# Patient Record
Sex: Female | Born: 1935 | Race: White | Hispanic: No | Smoking: Never smoker
Health system: Southern US, Community
[De-identification: ages and names within clinical notes are randomized; demographics above are authoritative.]

## PROBLEM LIST (undated history)

## (undated) ENCOUNTER — Emergency Department (HOSPITAL_COMMUNITY): Admission: EM | Payer: Medicare Other | Source: Home / Self Care

## (undated) DIAGNOSIS — F0390 Unspecified dementia without behavioral disturbance: Secondary | ICD-10-CM

## (undated) DIAGNOSIS — IMO0002 Reserved for concepts with insufficient information to code with codable children: Secondary | ICD-10-CM

## (undated) DIAGNOSIS — C541 Malignant neoplasm of endometrium: Secondary | ICD-10-CM

## (undated) DIAGNOSIS — I10 Essential (primary) hypertension: Principal | ICD-10-CM

## (undated) DIAGNOSIS — K219 Gastro-esophageal reflux disease without esophagitis: Secondary | ICD-10-CM

## (undated) DIAGNOSIS — Z923 Personal history of irradiation: Secondary | ICD-10-CM

## (undated) DIAGNOSIS — E785 Hyperlipidemia, unspecified: Secondary | ICD-10-CM

## (undated) DIAGNOSIS — M1990 Unspecified osteoarthritis, unspecified site: Secondary | ICD-10-CM

## (undated) DIAGNOSIS — J309 Allergic rhinitis, unspecified: Secondary | ICD-10-CM

## (undated) HISTORY — PX: LITHOTRIPSY: SUR834

## (undated) HISTORY — DX: Allergic rhinitis, unspecified: J30.9

## (undated) HISTORY — DX: Hyperlipidemia, unspecified: E78.5

## (undated) HISTORY — DX: Personal history of irradiation: Z92.3

## (undated) HISTORY — PX: ABDOMINAL HYSTERECTOMY: SHX81

## (undated) HISTORY — PX: CARDIAC CATHETERIZATION: SHX172

## (undated) HISTORY — DX: Reserved for concepts with insufficient information to code with codable children: IMO0002

## (undated) HISTORY — PX: OTHER SURGICAL HISTORY: SHX169

## (undated) HISTORY — PX: TUBAL LIGATION: SHX77

## (undated) HISTORY — DX: Malignant neoplasm of endometrium: C54.1

## (undated) HISTORY — DX: Essential (primary) hypertension: I10

---

## 1966-12-13 HISTORY — PX: BACK SURGERY: SHX140

## 2000-03-25 ENCOUNTER — Emergency Department (HOSPITAL_COMMUNITY): Admission: EM | Admit: 2000-03-25 | Discharge: 2000-03-26 | Payer: Self-pay | Admitting: *Deleted

## 2000-03-26 ENCOUNTER — Encounter: Payer: Self-pay | Admitting: Emergency Medicine

## 2000-03-27 ENCOUNTER — Encounter: Payer: Self-pay | Admitting: Urology

## 2000-03-27 ENCOUNTER — Inpatient Hospital Stay (HOSPITAL_COMMUNITY): Admission: EM | Admit: 2000-03-27 | Discharge: 2000-03-30 | Payer: Self-pay | Admitting: *Deleted

## 2000-03-29 ENCOUNTER — Encounter: Payer: Self-pay | Admitting: Urology

## 2000-04-07 ENCOUNTER — Encounter: Payer: Self-pay | Admitting: Urology

## 2000-04-07 ENCOUNTER — Ambulatory Visit (HOSPITAL_COMMUNITY): Admission: RE | Admit: 2000-04-07 | Discharge: 2000-04-07 | Payer: Self-pay | Admitting: Urology

## 2000-04-13 ENCOUNTER — Encounter: Payer: Self-pay | Admitting: Urology

## 2000-04-13 ENCOUNTER — Encounter: Admission: RE | Admit: 2000-04-13 | Discharge: 2000-04-13 | Payer: Self-pay | Admitting: Urology

## 2000-05-02 ENCOUNTER — Encounter: Payer: Self-pay | Admitting: Urology

## 2000-05-02 ENCOUNTER — Encounter: Admission: RE | Admit: 2000-05-02 | Discharge: 2000-05-02 | Payer: Self-pay | Admitting: Urology

## 2004-08-25 ENCOUNTER — Other Ambulatory Visit: Admission: RE | Admit: 2004-08-25 | Discharge: 2004-08-25 | Payer: Self-pay | Admitting: Internal Medicine

## 2004-10-27 ENCOUNTER — Ambulatory Visit: Payer: Self-pay | Admitting: Internal Medicine

## 2004-12-24 ENCOUNTER — Ambulatory Visit: Payer: Self-pay | Admitting: Internal Medicine

## 2005-03-17 ENCOUNTER — Ambulatory Visit: Payer: Self-pay | Admitting: Internal Medicine

## 2005-03-24 ENCOUNTER — Ambulatory Visit: Payer: Self-pay | Admitting: Internal Medicine

## 2005-04-04 ENCOUNTER — Encounter: Admission: RE | Admit: 2005-04-04 | Discharge: 2005-04-04 | Payer: Self-pay | Admitting: Orthopedic Surgery

## 2005-06-23 ENCOUNTER — Ambulatory Visit: Payer: Self-pay | Admitting: Internal Medicine

## 2005-09-15 ENCOUNTER — Ambulatory Visit: Payer: Self-pay | Admitting: Internal Medicine

## 2005-09-23 ENCOUNTER — Ambulatory Visit: Payer: Self-pay | Admitting: Internal Medicine

## 2007-05-05 ENCOUNTER — Emergency Department (HOSPITAL_COMMUNITY): Admission: EM | Admit: 2007-05-05 | Discharge: 2007-05-05 | Payer: Self-pay | Admitting: Emergency Medicine

## 2007-10-03 DIAGNOSIS — I10 Essential (primary) hypertension: Secondary | ICD-10-CM

## 2007-10-03 DIAGNOSIS — E785 Hyperlipidemia, unspecified: Secondary | ICD-10-CM

## 2007-10-03 DIAGNOSIS — E7800 Pure hypercholesterolemia, unspecified: Secondary | ICD-10-CM

## 2007-10-03 DIAGNOSIS — J309 Allergic rhinitis, unspecified: Secondary | ICD-10-CM

## 2007-10-03 HISTORY — DX: Hyperlipidemia, unspecified: E78.5

## 2007-10-03 HISTORY — DX: Essential (primary) hypertension: I10

## 2007-10-03 HISTORY — DX: Allergic rhinitis, unspecified: J30.9

## 2007-12-18 ENCOUNTER — Ambulatory Visit: Payer: Self-pay | Admitting: Internal Medicine

## 2008-01-18 ENCOUNTER — Ambulatory Visit: Payer: Self-pay | Admitting: Internal Medicine

## 2008-04-18 ENCOUNTER — Ambulatory Visit: Payer: Self-pay | Admitting: Internal Medicine

## 2008-06-06 ENCOUNTER — Encounter: Payer: Self-pay | Admitting: Internal Medicine

## 2010-05-15 ENCOUNTER — Ambulatory Visit: Payer: Self-pay | Admitting: Internal Medicine

## 2010-05-15 LAB — CONVERTED CEMR LAB
ALT: 18 units/L (ref 0–35)
AST: 23 units/L (ref 0–37)
Albumin: 4.2 g/dL (ref 3.5–5.2)
Alkaline Phosphatase: 72 units/L (ref 39–117)
BUN: 18 mg/dL (ref 6–23)
Basophils Relative: 0.8 % (ref 0.0–3.0)
Bilirubin, Direct: 0.1 mg/dL (ref 0.0–0.3)
Calcium: 9.4 mg/dL (ref 8.4–10.5)
Cholesterol: 221 mg/dL — ABNORMAL HIGH (ref 0–200)
Creatinine, Ser: 0.7 mg/dL (ref 0.4–1.2)
Eosinophils Absolute: 0.3 10*3/uL (ref 0.0–0.7)
Eosinophils Relative: 4.9 % (ref 0.0–5.0)
GFR calc non Af Amer: 84.01 mL/min (ref >60)
Glucose, Bld: 94 mg/dL (ref 70–99)
Hemoglobin: 13.9 g/dL (ref 12.0–15.0)
Lymphocytes Relative: 23.6 % (ref 12.0–46.0)
MCHC: 35 g/dL (ref 30.0–36.0)
Monocytes Relative: 7.8 % (ref 3.0–12.0)
Neutro Abs: 4.4 10*3/uL (ref 1.4–7.7)
Neutrophils Relative %: 62.9 % (ref 43.0–77.0)
RBC: 4.39 M/uL (ref 3.87–5.11)
Total Protein: 6.7 g/dL (ref 6.0–8.3)
WBC: 7 10*3/uL (ref 4.5–10.5)

## 2011-01-14 NOTE — Assessment & Plan Note (Signed)
Summary: MED CK (REFILLS) // RS   Vital Signs:  Patient profile:   77 year old female Weight:      205 pounds Temp:     98.5 degrees F oral BP sitting:   140 / 90  (left arm) Cuff size:   regular  Vitals Entered By: Duard Brady LPN (May 15, 1190 11:12 AM) CC: medication review - needs refills Is Patient Diabetic? No   CC:  medication review - needs refills.  History of Present Illness: 77 year old patient who has not been seen in approximately 2 years.  She has treated hypertension, dyslipidemia, and allergic rhinitis.  She has done quite well.  Denies any cardiopulmonary complaints.  Medical regimen includes triple antihypertensive therapy, which she tolerates well.  She has been on Crestor in the past  Preventive Screening-Counseling & Management  Alcohol-Tobacco     Smoking Status: never  Allergies (verified): No Known Drug Allergies  Past History:  Past Surgical History: Reviewed history from 12/18/2007 and no changes required. status post thyroidectomy heart catheterization in 1996 gravida 3, para 3, abortus zero Tubal ligation  Family History: Reviewed history from 12/18/2007 and no changes required. father died age 63 CK D. mother died age 84 RA  Three brothers for lung cancer, Corgard, or disease.  Doubt  Two sisters positive lung cancer, peptic ulcer disease  Social History: Smoking Status:  never  Review of Systems  The patient denies anorexia, fever, weight loss, weight gain, vision loss, decreased hearing, hoarseness, chest pain, syncope, dyspnea on exertion, peripheral edema, prolonged cough, headaches, hemoptysis, abdominal pain, melena, hematochezia, severe indigestion/heartburn, hematuria, incontinence, genital sores, muscle weakness, suspicious skin lesions, transient blindness, difficulty walking, depression, unusual weight change, abnormal bleeding, enlarged lymph nodes, angioedema, and breast masses.    Physical Exam  General:   overweight-appearing.  130/82 Head:  Normocephalic and atraumatic without obvious abnormalities. No apparent alopecia or balding. Eyes:  No corneal or conjunctival inflammation noted. EOMI. Perrla. Funduscopic exam benign, without hemorrhages, exudates or papilledema. Vision grossly normal. Mouth:  Oral mucosa and oropharynx without lesions or exudates.  Teeth in good repair. Neck:  No deformities, masses, or tenderness noted. Lungs:  Normal respiratory effort, chest expands symmetrically. Lungs are clear to auscultation, no crackles or wheezes. Heart:  Normal rate and regular rhythm. S1 and S2 normal without gallop, murmur, click, rub or other extra sounds. Abdomen:  Bowel sounds positive,abdomen soft and non-tender without masses, organomegaly or hernias noted. Msk:  No deformity or scoliosis noted of thoracic or lumbar spine.   Pulses:  R and L carotid,radial,femoral,dorsalis pedis and posterior tibial pulses are full and equal bilaterally Extremities:  No clubbing, cyanosis, edema, or deformity noted with normal full range of motion of all joints.     Impression & Recommendations:  Problem # 1:  HYPERTENSION (ICD-401.9)  Her updated medication list for this problem includes:    Hyzaar 100-12.5 Mg Tabs (Losartan potassium-hctz) .Marland Kitchen... Take 1 tablet by mouth once a day    Amlodipine Besylate 5 Mg Tabs (Amlodipine besylate) ..... One daily  Orders: Venipuncture (47829) TLB-CBC Platelet - w/Differential (85025-CBCD) TLB-Hepatic/Liver Function Pnl (80076-HEPATIC) TLB-TSH (Thyroid Stimulating Hormone) (84443-TSH) TLB-BMP (Basic Metabolic Panel-BMET) (80048-METABOL) TLB-Cholesterol, Total (82465-CHO)  Problem # 2:  HYPERLIPIDEMIA (ICD-272.4)  The following medications were removed from the medication list:    Crestor 5 Mg Tabs (Rosuvastatin calcium) ..... One every other day  Orders: Venipuncture (56213) TLB-CBC Platelet - w/Differential (85025-CBCD) TLB-Hepatic/Liver Function Pnl  (80076-HEPATIC) TLB-TSH (Thyroid Stimulating Hormone) (84443-TSH)  TLB-BMP (Basic Metabolic Panel-BMET) (80048-METABOL) TLB-Cholesterol, Total (82465-CHO)  Problem # 3:  ALLERGIC RHINITIS (ICD-477.9)  Her updated medication list for this problem includes:    Zyrtec Allergy 10 Mg Tabs (Cetirizine hcl) ..... Once daily as needed    Nasonex 50 Mcg/act Susp (Mometasone furoate) ..... Once daily as needed  Orders: Venipuncture (54098) TLB-CBC Platelet - w/Differential (85025-CBCD) TLB-Hepatic/Liver Function Pnl (80076-HEPATIC) TLB-TSH (Thyroid Stimulating Hormone) (84443-TSH) TLB-BMP (Basic Metabolic Panel-BMET) (80048-METABOL) TLB-Cholesterol, Total (82465-CHO)  Complete Medication List: 1)  Hyzaar 100-12.5 Mg Tabs (Losartan potassium-hctz) .... Take 1 tablet by mouth once a day 2)  Aspirin 81 Mg Tbec (Aspirin) .Marland Kitchen.. 1 once daily 3)  Zyrtec Allergy 10 Mg Tabs (Cetirizine hcl) .... Once daily as needed 4)  Nasonex 50 Mcg/act Susp (Mometasone furoate) .... Once daily as needed 5)  Amlodipine Besylate 5 Mg Tabs (Amlodipine besylate) .... One daily  Patient Instructions: 1)  Limit your Sodium (Salt). 2)  It is important that you exercise regularly at least 20 minutes 5 times a week. If you develop chest pain, have severe difficulty breathing, or feel very tired , stop exercising immediately and seek medical attention. 3)  You need to lose weight. Consider a lower calorie diet and regular exercise.  4)  Check your Blood Pressure regularly. If it is above: 150/90 you should make an appointment. 5)  Please schedule a follow-up appointment in 6 months for CPX  6)  Advised not to eat any food or drink any liquids after 10 PM the night before your procedure. Prescriptions: AMLODIPINE BESYLATE 5 MG  TABS (AMLODIPINE BESYLATE) one daily  #90 Tablet x 6   Entered and Authorized by:   Gordy Savers  MD   Signed by:   Gordy Savers  MD on 05/15/2010   Method used:   Electronically to         [facility]* (retail)       [address]       [place], [place]  [address]       Ph: [phone]       Fax: [phone]   RxID:   [id number] NASONEX 50 MCG/ACT  SUSP (MOMETASONE FUROATE) once daily as needed  #90 x 6   Entered and Authorized by:   Gordy Savers  MD   Signed by:   Gordy Savers  MD on 05/15/2010   Method used:   Electronically to        [facility]* (retail)       [address]       [place], [place]  [address]       Ph: [phone]       Fax: [phone]   RxID:   [id number] ZYRTEC ALLERGY 10 MG  TABS (CETIRIZINE HCL) once daily as needed  #90 x 6   Entered and Authorized by:   Gordy Savers  MD   Signed by:   Gordy Savers  MD on 05/15/2010   Method used:   Electronically to        [facility]* (retail)       [address]       [place], [place]  [address]       Ph: [phone]       Fax: [phone]   RxID:   [id number] HYZAAR 100-12.5 MG  TABS (LOSARTAN POTASSIUM-HCTZ) Take 1 tablet by mouth once a day  #90 Tablet  x 0   Entered and Authorized by:   Gordy Savers  MD   Signed by:   Gordy Savers  MD on 05/15/2010   Method used:   Electronically to        [facility]* (retail)       [address]       [place], [place]  [address]       Ph: [phone]       Fax: [phone]   RxID:   [id number]

## 2011-04-30 NOTE — Discharge Summary (Signed)
[facility]  Patient:    Misty Alvarado, Misty Alvarado                       MRN: [id number] Adm. Date:  03/26/00 Disc. Date: 03/30/00 Attending:  Justine Null, M.D. [facility] CC:         Barron Alvine, M.D.                           Discharge Summary  REASON FOR ADMISSION:  The patient is a 77 year old woman admitted by Dr. Debby Bud on April 14 of this year with urosepsis.  Please refer to his dictated history and physical for details.  HOSPITAL COURSE:  The patient was admitted and blood cultures and urine culture were done.  She was treated with intravenous antibiotics and urology was consulted.  Dr. Isabel Caprice determined that the patient had a stone in a ureter with a high-grade obstruction.  Dr. Isabel Caprice placed a ureter stent on March 27, 2000, and the patient continued to improve.  She was successfully weaned off her dopamine that she required for her hypotension.  She was successfully transferred from the critical care unit to the floor and was successfully changed to p.o. antibiotics.  By March 30, 2000, the patient was alert, oriented, ambulatory, and eating a usual diet and afebrile on oral medication. She was thus discharged home in good condition.  DIAGNOSES AT THE TIME OF DISCHARGE: 1. Urosepsis secondary to #2, resolved. 2. Stone in a ureter. 3. Other chronic medical diagnoses noted during Dr. Debby Bud history and    physical.  MEDICATIONS: 1. Cipro 500 mg twice daily. 2. Vicodin one or two q.6h. as needed for pain.  FOLLOWUP:  Followup will be with Dr. Isabel Caprice within a week, to setup for probably ESWL of the stone.  DIET AND ACTIVITY:  As tolerated. DD:  04/27/00 TD:  05/02/00 Job: [id number]

## 2011-06-01 ENCOUNTER — Other Ambulatory Visit: Payer: Self-pay | Admitting: Internal Medicine

## 2011-07-13 ENCOUNTER — Other Ambulatory Visit: Payer: Self-pay | Admitting: Internal Medicine

## 2011-09-10 ENCOUNTER — Other Ambulatory Visit: Payer: Self-pay | Admitting: Internal Medicine

## 2011-09-28 ENCOUNTER — Ambulatory Visit (INDEPENDENT_AMBULATORY_CARE_PROVIDER_SITE_OTHER): Payer: Medicare Other | Admitting: Internal Medicine

## 2011-09-28 ENCOUNTER — Encounter: Payer: Self-pay | Admitting: Internal Medicine

## 2011-09-28 DIAGNOSIS — E785 Hyperlipidemia, unspecified: Secondary | ICD-10-CM

## 2011-09-28 DIAGNOSIS — I10 Essential (primary) hypertension: Secondary | ICD-10-CM

## 2011-09-28 DIAGNOSIS — J309 Allergic rhinitis, unspecified: Secondary | ICD-10-CM

## 2011-09-28 DIAGNOSIS — J069 Acute upper respiratory infection, unspecified: Secondary | ICD-10-CM

## 2011-09-28 MED ORDER — AMLODIPINE BESYLATE 5 MG PO TABS: 5.0000 mg | ORAL_TABLET | Freq: Every day | ORAL | Status: DC

## 2011-09-28 MED ORDER — MOMETASONE FUROATE 50 MCG/ACT NA SUSP: 2.0000 | Freq: Every day | NASAL | Status: DC

## 2011-09-28 MED ORDER — LOSARTAN POTASSIUM-HCTZ 100-12.5 MG PO TABS: 1.0000 | ORAL_TABLET | Freq: Every day | ORAL | Status: DC

## 2011-09-28 NOTE — Patient Instructions (Signed)
Get plenty of rest, Drink lots of  clear liquids, and use Tylenol or ibuprofen for fever and discomfort.    Limit your sodium (Salt) intake  Please check your blood pressure on a regular basis.  If it is consistently greater than 150/90, please make an office appointment.  Return in 6 months for follow-up

## 2011-09-28 NOTE — Progress Notes (Signed)
  Subjective:    Patient ID: Misty Alvarado, female    DOB: 1933/08/06, 77 y.o.   MRN: [id number]  HPI 77 year old patient who was stable until last month when she developed head and chest congestion she has had headache sore throat weakness and low-grade fever there has been a minimal nonproductive cough. She feels unwell. She has a history of hypertension which has been controlled on combination therapy. She needs medication refills. She has a history of allergic rhinitis.  Review of Systems  Constitutional: Positive for fever and fatigue.  HENT: Positive for congestion, sore throat and rhinorrhea. Negative for hearing loss, dental problem, sinus pressure and tinnitus.   Eyes: Negative for pain, discharge and visual disturbance.  Respiratory: Positive for cough. Negative for shortness of breath.   Cardiovascular: Negative for chest pain, palpitations and leg swelling.  Gastrointestinal: Negative for nausea, vomiting, abdominal pain, diarrhea, constipation, blood in stool and abdominal distention.  Genitourinary: Negative for dysuria, urgency, frequency, hematuria, flank pain, vaginal bleeding, vaginal discharge, difficulty urinating, vaginal pain and pelvic pain.  Musculoskeletal: Negative for joint swelling, arthralgias and gait problem.  Skin: Negative for rash.  Neurological: Positive for weakness. Negative for dizziness, syncope, speech difficulty, numbness and headaches.  Hematological: Negative for adenopathy.  Psychiatric/Behavioral: Negative for behavioral problems, dysphoric mood and agitation. The patient is not nervous/anxious.        Objective:   Physical Exam  Constitutional: She is oriented to person, place, and time. She appears well-developed and well-nourished.       Appears unwell but in no acute distress. Blood pressure 130/82 on repeat  HENT:  Head: Normocephalic.  Right Ear: External ear normal.  Left Ear: External ear normal.       Mild erythema  Eyes:  Conjunctivae and EOM are normal. Pupils are equal, round, and reactive to light.  Neck: Normal range of motion. Neck supple. No thyromegaly present.  Cardiovascular: Normal rate, regular rhythm, normal heart sounds and intact distal pulses.   Pulmonary/Chest: Effort normal and breath sounds normal. No respiratory distress. She has no wheezes.  Abdominal: Soft. Bowel sounds are normal. She exhibits no mass. There is no tenderness.  Musculoskeletal: Normal range of motion.  Lymphadenopathy:    She has no cervical adenopathy.  Neurological: She is alert and oriented to person, place, and time.  Skin: Skin is warm and dry. No rash noted.  Psychiatric: She has a normal mood and affect. Her behavior is normal.          Assessment & Plan:   Viral URI. Will treat symptomatically Hypertension well controlled medications refilled Allergic rhinitis stable medications refilled  Recheck in 6 months or when necessary

## 2012-09-04 ENCOUNTER — Encounter: Payer: Self-pay | Admitting: Internal Medicine

## 2012-09-04 ENCOUNTER — Ambulatory Visit (INDEPENDENT_AMBULATORY_CARE_PROVIDER_SITE_OTHER): Payer: Medicare Other | Admitting: Internal Medicine

## 2012-09-04 VITALS — BP 120/80 | Temp 97.9°F | Wt 210.0 lb

## 2012-09-04 DIAGNOSIS — I10 Essential (primary) hypertension: Secondary | ICD-10-CM

## 2012-09-04 DIAGNOSIS — J309 Allergic rhinitis, unspecified: Secondary | ICD-10-CM

## 2012-09-04 DIAGNOSIS — J069 Acute upper respiratory infection, unspecified: Secondary | ICD-10-CM

## 2012-09-04 DIAGNOSIS — E785 Hyperlipidemia, unspecified: Secondary | ICD-10-CM

## 2012-09-04 DIAGNOSIS — Z23 Encounter for immunization: Secondary | ICD-10-CM

## 2012-09-04 MED ORDER — HYDROCODONE-HOMATROPINE 5-1.5 MG/5ML PO SYRP: 5.0000 mL | ORAL_SOLUTION | Freq: Four times a day (QID) | ORAL | Status: AC | PRN

## 2012-09-04 MED ORDER — MOMETASONE FUROATE 50 MCG/ACT NA SUSP: 2.0000 | Freq: Every day | NASAL | Status: DC

## 2012-09-04 MED ORDER — LOSARTAN POTASSIUM-HCTZ 100-12.5 MG PO TABS: 1.0000 | ORAL_TABLET | Freq: Every day | ORAL | Status: DC

## 2012-09-04 MED ORDER — AMLODIPINE BESYLATE 5 MG PO TABS: 5.0000 mg | ORAL_TABLET | Freq: Every day | ORAL | Status: DC

## 2012-09-04 NOTE — Patient Instructions (Signed)
Take over-the-counter expectorants and cough medications such as  Mucinex DM.  Call if there is no improvement in 5 to 7 days or if he developed worsening cough, fever, or new symptoms, such as shortness of breath or chest pain. Limit your sodium (Salt) intake  Return in 6 months for follow-up

## 2012-09-04 NOTE — Progress Notes (Signed)
Subjective:    Patient ID: Misty Alvarado, female    DOB: [birth year], 78 y.o.   MRN: [id number]  HPI 78 year old patient who has treated hypertension and a history of dyslipidemia allergic rhinitis. She is seen today with a 3 to four-day history of chest congestion and mildly productive cough. Cough has been productive of yellow-tan sputum. No fever chills shortness of breath or wheezing. She was seen here approximately 11  months ago for a similar illness  Past Medical History  Diagnosis Date  . ALLERGIC RHINITIS 10/03/2007  . HYPERLIPIDEMIA 10/03/2007  . HYPERTENSION 10/03/2007  . Ulcer     gastric    History   Social History  . Marital Status: Widowed    Spouse Name: N/A    Number of Children: N/A  . Years of Education: N/A   Occupational History  . Not on file.   Social History Main Topics  . Smoking status: Never Smoker   . Smokeless tobacco: Never Used  . Alcohol Use: No  . Drug Use: No  . Sexually Active: Not on file   Other Topics Concern  . Not on file   Social History Narrative  . No narrative on file    Past Surgical History  Procedure Date  . Cardiac catheterization   . Tubal ligation   . Thyroidectomy     Family History  Problem Relation Age of Onset  . Arthritis Mother     No Known Allergies  Current Outpatient Prescriptions on File Prior to Visit  Medication Sig Dispense Refill  . amLODipine (NORVASC) 5 MG tablet Take 1 tablet (5 mg total) by mouth daily.  90 tablet  4  . aspirin 81 MG tablet Take 81 mg by mouth daily.        Marland Kitchen losartan-hydrochlorothiazide (HYZAAR) 100-12.5 MG per tablet Take 1 tablet by mouth daily.  90 tablet  4  . mometasone (NASONEX) 50 MCG/ACT nasal spray Place 2 sprays into the nose daily.  17 g  4    BP 120/80  Temp 97.9 F (36.6 C) (Oral)  Wt 210 lb (95.255 kg)     Review of Systems  Constitutional: Positive for fatigue.  HENT: Positive for congestion. Negative for hearing loss, sore throat, rhinorrhea,  dental problem, sinus pressure and tinnitus.   Eyes: Negative for pain, discharge and visual disturbance.  Respiratory: Positive for cough. Negative for shortness of breath.   Cardiovascular: Negative for chest pain, palpitations and leg swelling.  Gastrointestinal: Negative for nausea, vomiting, abdominal pain, diarrhea, constipation, blood in stool and abdominal distention.  Genitourinary: Negative for dysuria, urgency, frequency, hematuria, flank pain, vaginal bleeding, vaginal discharge, difficulty urinating, vaginal pain and pelvic pain.  Musculoskeletal: Negative for joint swelling, arthralgias and gait problem.  Skin: Negative for rash.  Neurological: Negative for dizziness, syncope, speech difficulty, weakness, numbness and headaches.  Hematological: Negative for adenopathy.  Psychiatric/Behavioral: Negative for behavioral problems, dysphoric mood and agitation. The patient is not nervous/anxious.        Objective:   Physical Exam  Constitutional: She is oriented to person, place, and time. She appears well-developed and well-nourished.  HENT:  Head: Normocephalic.  Right Ear: External ear normal.  Left Ear: External ear normal.  Mouth/Throat: Oropharynx is clear and moist.  Eyes: Conjunctivae normal and EOM are normal. Pupils are equal, round, and reactive to light.  Neck: Normal range of motion. Neck supple. No thyromegaly present.  Cardiovascular: Normal rate, regular rhythm, normal heart sounds and intact distal pulses.  Pulmonary/Chest: Effort normal and breath sounds normal.  Abdominal: Soft. Bowel sounds are normal. She exhibits no mass. There is no tenderness.  Musculoskeletal: Normal range of motion.  Lymphadenopathy:    She has no cervical adenopathy.  Neurological: She is alert and oriented to person, place, and time.  Skin: Skin is warm and dry. No rash noted.  Psychiatric: She has a normal mood and affect. Her behavior is normal.          Assessment &  Plan:   Viral URI with cough. We'll treat symptomatically Hypertension well controlled  Schedule CPX all medications refilled

## 2013-03-02 ENCOUNTER — Other Ambulatory Visit: Payer: Self-pay | Admitting: Obstetrics & Gynecology

## 2013-04-02 ENCOUNTER — Encounter: Payer: Self-pay | Admitting: Gynecology

## 2013-04-02 ENCOUNTER — Ambulatory Visit: Payer: Medicare Other | Attending: Gynecology | Admitting: Gynecology

## 2013-04-02 VITALS — BP 160/80 | HR 100 | Temp 98.1°F | Resp 20 | Ht 66.25 in | Wt 215.6 lb

## 2013-04-02 DIAGNOSIS — C549 Malignant neoplasm of corpus uteri, unspecified: Secondary | ICD-10-CM | POA: Insufficient documentation

## 2013-04-02 DIAGNOSIS — C541 Malignant neoplasm of endometrium: Secondary | ICD-10-CM | POA: Insufficient documentation

## 2013-04-02 NOTE — Progress Notes (Signed)
Consult Note: Gyn-Onc   Misty Alvarado 79 y.o. female  No chief complaint on file.   Assessment : Grade 1 endometrial adenocarcinoma  Plan: I recommend the patient undergo a robotic assisted hysterectomy, bilateral salpingo-oophorectomy, and possible pelvic lymphadenectomy. We'll arrange this to be performed by Dr. Cleda Mccreedy.  The risks of surgery including hemorrhage, infection, injury to adjacent viscera and thrombolic complications were all outlined. All the patient's questions are answered.      HPI: The patient is seen in consultation at the request of Dr. Langston Masker regarding management of a newly diagnosed endometrial cancer. The patient initially presented with approximately 6 weeks for postmenopausal bleeding. An endometrial biopsy is obtained. The patient denies any constitutional symptoms. She specifically denies any pelvic pain pressure or cramping and has had no weight loss. Her functional status is stable.  She has no past gynecologic history. Obstetrical history: gravida 3 she comes accompanied by her oldest daughter today.  Review of Systems:10 point review of systems is negative except as noted in interval history.   Vitals: Blood pressure 160/80, pulse 100, temperature 98.1 F (36.7 C), temperature source Oral, resp. rate 20, height 5' 6.25" (1.683 m), weight 215 lb 9.6 oz (97.796 kg).  Physical Exam: General : The patient is a healthy woman in no acute distress.  HEENT: normocephalic, extraoccular movements normal; neck is supple without thyromegally  Lynphnodes: Supraclavicular and inguinal nodes not enlarged  Abdomen: Soft, non-tender, no ascites, no organomegally, no masses, no hernias  Pelvic:  EGBUS: Normal female  Vagina: Normal, no lesions  Urethra and Bladder: Normal, non-tender  Cervix: Atrophic Uterus: Difficult to outline due to the patient's habitus. I do not think. that it is enlarged. Bi-manual examination: Non-tender; no adenxal masses or  nodularity  Rectal: normal sphincter tone, no masses, no blood  Lower extremities: No edema or varicosities. Normal range of motion      No Known Allergies  Past Medical History  Diagnosis Date  . ALLERGIC RHINITIS 10/03/2007  . HYPERLIPIDEMIA 10/03/2007  . HYPERTENSION 10/03/2007  . Ulcer     gastric    Past Surgical History  Procedure Laterality Date  . Cardiac catheterization    . Tubal ligation    . Thyroidectomy      Current Outpatient Prescriptions  Medication Sig Dispense Refill  . amLODipine (NORVASC) 5 MG tablet Take 1 tablet (5 mg total) by mouth daily.  90 tablet  4  . aspirin 81 MG tablet Take 81 mg by mouth daily.        Marland Kitchen losartan-hydrochlorothiazide (HYZAAR) 100-12.5 MG per tablet Take 1 tablet by mouth daily.  90 tablet  4  . mometasone (NASONEX) 50 MCG/ACT nasal spray Place 2 sprays into the nose daily.  17 g  4   No current facility-administered medications for this visit.    History   Social History  . Marital Status: Widowed    Spouse Name: N/A    Number of Children: N/A  . Years of Education: N/A   Occupational History  . Not on file.   Social History Main Topics  . Smoking status: Never Smoker   . Smokeless tobacco: Never Used  . Alcohol Use: No  . Drug Use: No  . Sexually Active: Not on file   Other Topics Concern  . Not on file   Social History Narrative  . No narrative on file    Family History  Problem Relation Age of Onset  . Arthritis Mother  Jeannette Corpus, MD 04/02/2013, 12:31 PM

## 2013-04-02 NOTE — Patient Instructions (Signed)
We will contact you for preoperative evaluation.

## 2013-04-03 ENCOUNTER — Telehealth: Payer: Self-pay | Admitting: Internal Medicine

## 2013-04-03 ENCOUNTER — Other Ambulatory Visit: Payer: Self-pay | Admitting: Internal Medicine

## 2013-04-03 DIAGNOSIS — Z1211 Encounter for screening for malignant neoplasm of colon: Secondary | ICD-10-CM

## 2013-04-03 DIAGNOSIS — Z780 Asymptomatic menopausal state: Secondary | ICD-10-CM

## 2013-04-03 DIAGNOSIS — Z1239 Encounter for other screening for malignant neoplasm of breast: Secondary | ICD-10-CM

## 2013-04-03 NOTE — Telephone Encounter (Signed)
Please schedule DEXA scan mammogram and colonoscopy

## 2013-04-03 NOTE — Telephone Encounter (Signed)
[facility] Triage Call Report Triage Record Num: [id number] Operator: Tomasita Crumble Patient Name: Misty Alvarado Call Date & Time: 04/02/2013 5:24:57PM Patient Phone: [phone] PCP: Patient Gender: Female PCP Fax : Patient DOB: [birth year] [facility] Name: [facility] Reason for Call: Caller: [name]/Other; PCP: Other; CB#: [phone]; Call regarding Request; states has seen Dr. Langston Masker and she has endometrial cancer. [name] has not had colonoscopy, she does not recall the last mammogram or dexa scan. It was recommended by Dr. Loree Fee to get these done before her hysterectomy 04/24/13. Advised to follow up with office during regular hours per PCP Calls, No Triage protocol. Caller states she will speak with a friend regarding which provider is preferred to do the colonoscopy. OFFICE, PLEASE NOTE CALLER WAS ADVISED TO CALL OFFICE ABOUT THIS; SHE WANTS MAMMOGRAM AND DEXA SCAN TO BE ORDERED ASAP. Protocol(s) Used: PCP Calls, No Triage (Adult) Recommended Outcome per Protocol: Call Provider within 24 Hours Reason for Outcome: [1] Follow-up call from parent regarding patient's clinical status AND [2] information not urgent Care Advice: ~ 04/

## 2013-04-03 NOTE — Telephone Encounter (Signed)
Orders done and sent to [facility].

## 2013-04-05 ENCOUNTER — Telehealth: Payer: Self-pay | Admitting: Gastroenterology

## 2013-04-05 NOTE — Telephone Encounter (Signed)
There are pre-visit available 5/6 and 5/7 then she can have the next available procedure

## 2013-04-05 NOTE — Telephone Encounter (Signed)
We have an Urgent referral for this pt and they want her seen before 04-24-13, but we don't have any pre-visit appts. Please advise.         Pt has endometrial cancerand has not had a colonoscopy It was recommended by Dr. Loree Fee to get these done before her hysterectomy 04/24/13.   Sent referral to [facility]queue. They will call pt and set up appt directly.

## 2013-04-06 ENCOUNTER — Encounter: Payer: Self-pay | Admitting: Internal Medicine

## 2013-04-09 ENCOUNTER — Ambulatory Visit
Admission: RE | Admit: 2013-04-09 | Discharge: 2013-04-09 | Disposition: A | Discharge status: Home / Self Care | Payer: Medicare Other | Source: Ambulatory Visit | Attending: Internal Medicine | Admitting: Internal Medicine

## 2013-04-09 DIAGNOSIS — Z780 Asymptomatic menopausal state: Secondary | ICD-10-CM

## 2013-04-11 ENCOUNTER — Ambulatory Visit (AMBULATORY_SURGERY_CENTER): Payer: Medicare Other | Admitting: *Deleted

## 2013-04-11 ENCOUNTER — Telehealth: Payer: Self-pay | Admitting: *Deleted

## 2013-04-11 VITALS — Ht 66.0 in | Wt 212.8 lb

## 2013-04-11 DIAGNOSIS — Z1211 Encounter for screening for malignant neoplasm of colon: Secondary | ICD-10-CM

## 2013-04-11 MED ORDER — NA SULFATE-K SULFATE-MG SULF 17.5-3.13-1.6 GM/177ML PO SOLN: ORAL | Status: DC

## 2013-04-11 NOTE — Telephone Encounter (Signed)
It is pretty common to have a colonoscopy prior to a surgery like that - if there were a major colon lesion then she would only need one surgery (two surgeries worse than two preps)      Would not change anything         ----- Message -----   From: Maple Hudson, RN   Sent: 04/11/2013 9:39 AM   To: Iva Boop, MD   Subject: need for OV or postpone colonoscopy?       Dr. Leone Payor: Pt has new diagnosis of endometrial cancer with robot assisted hysterectomy scheduled for 5/13. Pt has never had screening colonoscopy and is 79 years old. Daughter says that Dr. De Blanch recommended that she have colonoscopy before hysterectomy. I did not see anything about that in his office note from 4/21. She is scheduled for colonoscopy 5/6. If I am correct; she will have to do bowel prep before hysterectomy; that will be 2 preps in a 7 week span. Should she wait for colonoscopy until recovered from surgery or okay to proceed with colonoscopy as scheduled? Does she need OV to discuss before scheduling colonoscopy? Thanks, Olegario Messier

## 2013-04-11 NOTE — Telephone Encounter (Signed)
Daughter notified that pt should proceed with colonoscopy as planned.

## 2013-04-12 ENCOUNTER — Encounter: Payer: Self-pay | Admitting: Internal Medicine

## 2013-04-12 ENCOUNTER — Encounter (HOSPITAL_COMMUNITY): Payer: Self-pay | Admitting: Pharmacy Technician

## 2013-04-17 ENCOUNTER — Encounter: Payer: Self-pay | Admitting: Internal Medicine

## 2013-04-17 ENCOUNTER — Ambulatory Visit (AMBULATORY_SURGERY_CENTER): Payer: Medicare Other | Admitting: Internal Medicine

## 2013-04-17 VITALS — BP 136/75 | HR 74 | Temp 99.2°F | Resp 23 | Ht 66.0 in | Wt 212.0 lb

## 2013-04-17 DIAGNOSIS — Z1211 Encounter for screening for malignant neoplasm of colon: Secondary | ICD-10-CM

## 2013-04-17 DIAGNOSIS — D126 Benign neoplasm of colon, unspecified: Secondary | ICD-10-CM

## 2013-04-17 NOTE — Progress Notes (Signed)
Patient did not experience any of the following events: a burn prior to discharge; a fall within the facility; wrong site/side/patient/procedure/implant event; or a hospital transfer or hospital admission upon discharge from the facility. (G8907) Patient did not have preoperative order for IV antibiotic SSI prophylaxis. (G8918)  

## 2013-04-17 NOTE — Patient Instructions (Addendum)
Two tiny polyps were removed - they look benign.I will let you know. Otherwise normal exam with excellent prep.  Good luck with your upcoming surgery.   Iva Boop, MD, FACG  YOU HAD AN ENDOSCOPIC PROCEDURE TODAY AT THE [facility]: Refer to the procedure report that was given to you for any specific questions about what was found during the examination.  If the procedure report does not answer your questions, please call your gastroenterologist to clarify.  If you requested that your care partner not be given the details of your procedure findings, then the procedure report has been included in a sealed envelope for you to review at your convenience later.  YOU SHOULD EXPECT: Some feelings of bloating in the abdomen. Passage of more gas than usual.  Walking can help get rid of the air that was put into your GI tract during the procedure and reduce the bloating. If you had a lower endoscopy (such as a colonoscopy or flexible sigmoidoscopy) you may notice spotting of blood in your stool or on the toilet paper. If you underwent a bowel prep for your procedure, then you may not have a normal bowel movement for a few days.  DIET: Your first meal following the procedure should be a light meal and then it is ok to progress to your normal diet.  A half-sandwich or bowl of soup is an example of a good first meal.  Heavy or fried foods are harder to digest and may make you feel nauseous or bloated.  Likewise meals heavy in dairy and vegetables can cause extra gas to form and this can also increase the bloating.  Drink plenty of fluids but you should avoid alcoholic beverages for 24 hours.  ACTIVITY: Your care partner should take you home directly after the procedure.  You should plan to take it easy, moving slowly for the rest of the day.  You can resume normal activity the day after the procedure however you should NOT DRIVE or use heavy machinery for 24 hours (because of the sedation  medicines used during the test).    SYMPTOMS TO REPORT IMMEDIATELY: A gastroenterologist can be reached at any hour.  During normal business hours, 8:30 AM to 5:00 PM Monday through Friday, call [phone].  After hours and on weekends, please call the GI answering service at [phone] who will take a message and have the physician on call contact you.   Following lower endoscopy (colonoscopy or flexible sigmoidoscopy):  Excessive amounts of blood in the stool  Significant tenderness or worsening of abdominal pains  Swelling of the abdomen that is new, acute  Fever of 100F or higher  FOLLOW UP: If any biopsies were taken you will be contacted by phone or by letter within the next 1-3 weeks.  Call your gastroenterologist if you have not heard about the biopsies in 3 weeks.  Our staff will call the home number listed on your records the next business day following your procedure to check on you and address any questions or concerns that you may have at that time regarding the information given to you following your procedure. This is a courtesy call and so if there is no answer at the home number and we have not heard from you through the emergency physician on call, we will assume that you have returned to your regular daily activities without incident.  SIGNATURES/CONFIDENTIALITY: You and/or your care partner have signed paperwork which will be entered into your  electronic medical record.  These signatures attest to the fact that that the information above on your After Visit Summary has been reviewed and is understood.  Full responsibility of the confidentiality of this discharge information lies with you and/or your care-partner.

## 2013-04-17 NOTE — Op Note (Signed)
[facility] [address] [place], [address]   COLONOSCOPY PROCEDURE REPORT  PATIENT: Misty Alvarado, Misty Alvarado  MR#: [id number] BIRTHDATE: [birth year] , 79  yrs. old GENDER: Female ENDOSCOPIST: Iva Boop, MD, [facility] REFERRED [id number], Daniel PROCEDURE DATE:  04/17/2013 PROCEDURE:   Colonoscopy with snare polypectomy ASA CLASS:   Class II INDICATIONS:average risk screening.   first colonoscopy MEDICATIONS: propofol (Diprivan) 200mg  IV  DESCRIPTION OF PROCEDURE:   After the risks benefits and alternatives of the procedure were thoroughly explained, informed consent was obtained.  A digital rectal exam revealed no rectal mass and A digital rectal exam revealed external hemorrhoids/tags. The LB [id number]-[id number]  endoscope was introduced through the anus and advanced to the cecum, which was identified by both the appendix and ileocecal valve. No adverse events experienced.   The quality of the prep was excellent using Suprep  The instrument was then slowly withdrawn as the colon was fully examined.      COLON FINDINGS: Two diminutive polypoid shaped sessile polyps were found in the distal sigmoid colon and rectum.  A polypectomy was performed with a cold snare.  The resection was complete and the polyp tissue was completely retrieved.   The colon mucosa was otherwise normal.  Retroflexed views revealed no abnormalities. The time to cecum=4 minutes 10 seconds.  Withdrawal time=11 minutes 18 seconds.  The scope was withdrawn and the procedure completed. COMPLICATIONS: There were no complications.  ENDOSCOPIC IMPRESSION: 1.   Two diminutive sessile polyps were found in the distal sigmoid colon and rectum; polypectomy was performed with a cold snare 2.   The colon mucosa was otherwise normal - excellent prep  RECOMMENDATIONS: will notify pathology results, probably will not need a routine repeat colonoscopy with these findings.   eSigned:  Iva Boop, MD, [facility] 04/17/2013 12:21 PM   cc: The Patient and De Blanch MD

## 2013-04-17 NOTE — Progress Notes (Signed)
Lidocaine-40mg IV prior to Propofol InductionPropofol given over incremental dosages 

## 2013-04-18 ENCOUNTER — Telehealth: Payer: Self-pay

## 2013-04-18 NOTE — Telephone Encounter (Signed)
Left message on answering machine. 

## 2013-04-20 ENCOUNTER — Encounter (HOSPITAL_COMMUNITY)
Admission: RE | Admit: 2013-04-20 | Discharge: 2013-04-20 | Disposition: A | Discharge status: Home / Self Care | Payer: Medicare Other | Source: Ambulatory Visit | Attending: Gynecologic Oncology | Admitting: Gynecologic Oncology

## 2013-04-20 ENCOUNTER — Encounter: Payer: Medicare Other | Admitting: Internal Medicine

## 2013-04-20 ENCOUNTER — Ambulatory Visit (HOSPITAL_COMMUNITY)
Admission: RE | Admit: 2013-04-20 | Discharge: 2013-04-20 | Disposition: A | Discharge status: Home / Self Care | Payer: Medicare Other | Source: Ambulatory Visit | Attending: Gynecologic Oncology | Admitting: Gynecologic Oncology

## 2013-04-20 ENCOUNTER — Encounter (HOSPITAL_COMMUNITY): Payer: Self-pay

## 2013-04-20 DIAGNOSIS — Z01810 Encounter for preprocedural cardiovascular examination: Secondary | ICD-10-CM | POA: Insufficient documentation

## 2013-04-20 DIAGNOSIS — J8410 Pulmonary fibrosis, unspecified: Secondary | ICD-10-CM | POA: Insufficient documentation

## 2013-04-20 DIAGNOSIS — Z01812 Encounter for preprocedural laboratory examination: Secondary | ICD-10-CM | POA: Insufficient documentation

## 2013-04-20 DIAGNOSIS — C549 Malignant neoplasm of corpus uteri, unspecified: Secondary | ICD-10-CM | POA: Insufficient documentation

## 2013-04-20 DIAGNOSIS — Z01818 Encounter for other preprocedural examination: Secondary | ICD-10-CM | POA: Insufficient documentation

## 2013-04-20 DIAGNOSIS — Z0183 Encounter for blood typing: Secondary | ICD-10-CM | POA: Insufficient documentation

## 2013-04-20 HISTORY — DX: Unspecified osteoarthritis, unspecified site: M19.90

## 2013-04-20 HISTORY — DX: Gastro-esophageal reflux disease without esophagitis: K21.9

## 2013-04-20 LAB — COMPREHENSIVE METABOLIC PANEL
ALT: 16 U/L (ref 0–35)
AST: 19 U/L (ref 0–37)
Albumin: 3.8 g/dL (ref 3.5–5.2)
Calcium: 9.4 mg/dL (ref 8.4–10.5)
Creatinine, Ser: 0.81 mg/dL (ref 0.50–1.10)
Sodium: 139 mEq/L (ref 135–145)
Total Protein: 7.2 g/dL (ref 6.0–8.3)

## 2013-04-20 LAB — CBC WITH DIFFERENTIAL/PLATELET
Basophils Absolute: 0.1 10*3/uL (ref 0.0–0.1)
Basophils Relative: 1 % (ref 0–1)
Eosinophils Absolute: 0.2 10*3/uL (ref 0.0–0.7)
Eosinophils Relative: 4 % (ref 0–5)
Lymphocytes Relative: 35 % (ref 12–46)
MCHC: 34.1 g/dL (ref 30.0–36.0)
MCV: 86.5 fL (ref 78.0–100.0)
Monocytes Absolute: 0.4 10*3/uL (ref 0.1–1.0)
Platelets: 290 10*3/uL (ref 150–400)
RDW: 12.1 % (ref 11.5–15.5)
WBC: 5.5 10*3/uL (ref 4.0–10.5)

## 2013-04-20 LAB — ABO/RH: ABO/RH(D): A POS

## 2013-04-20 NOTE — Pre-Procedure Instructions (Signed)
EKG AND CXR WERE DONE TODAY - PREOP AT [facility]. 

## 2013-04-20 NOTE — Patient Instructions (Addendum)
YOUR SURGERY IS SCHEDULED AT [facility]  ON:  Tuesday  513  REPORT TO [facility] AT:  9:45 AM      PHONE # FOR SHORT STAY IS [phone]          REMEMBER  -- CLEAR LIQUID DIET ONLY - ON  Monday 5/12.  DO NOT EAT OR DRINK ANYTHING AFTER MIDNIGHT THE NIGHT BEFORE YOUR SURGERY.  YOU MAY BRUSH YOUR TEETH, RINSE OUT YOUR MOUTH--BUT NO WATER, NO FOOD, NO CHEWING GUM, NO MINTS, NO CANDIES, NO CHEWING TOBACCO.  PLEASE TAKE THE FOLLOWING MEDICATIONS THE AM OF YOUR SURGERY WITH A FEW SIPS OF WATER:  NO MEDICINES TO TAKE THE DAY OF SURGERY.     DO NOT BRING VALUABLES, MONEY, CREDIT CARDS.  DO NOT WEAR JEWELRY, MAKE-UP, NAIL POLISH AND NO METAL PINS OR CLIPS IN YOUR HAIR. CONTACT LENS, DENTURES / PARTIALS, GLASSES SHOULD NOT BE WORN TO SURGERY AND IN MOST CASES-HEARING AIDS WILL NEED TO BE REMOVED.  BRING YOUR GLASSES CASE, ANY EQUIPMENT NEEDED FOR YOUR CONTACT LENS. FOR PATIENTS ADMITTED TO THE HOSPITAL--CHECK OUT TIME THE DAY OF DISCHARGE IS 11:00 AM.  ALL INPATIENT ROOMS ARE PRIVATE - WITH BATHROOM, TELEPHONE, TELEVISION AND WIFI INTERNET.  TURNING, COUGHING, DEEP BREATHING, LEG EXERCISES EVERY 2 HOURS AFTER SURGERY WILL HELP TO PREVENT BREATHING AND CIRCULATION PROBLEMS.                              PLEASE READ OVER ANY  FACT SHEETS THAT YOU WERE GIVEN: MRSA INFORMATION, BLOOD TRANSFUSION INFORMATION, INCENTIVE SPIROMETER INFORMATION. FAILURE TO FOLLOW THESE INSTRUCTIONS MAY RESULT IN THE CANCELLATION OF YOUR SURGERY.   PATIENT SIGNATURE_________________________________

## 2013-04-23 ENCOUNTER — Ambulatory Visit
Admission: RE | Admit: 2013-04-23 | Discharge: 2013-04-23 | Disposition: A | Discharge status: Home / Self Care | Payer: Medicare Other | Source: Ambulatory Visit | Attending: Internal Medicine | Admitting: Internal Medicine

## 2013-04-23 ENCOUNTER — Encounter: Payer: Self-pay | Admitting: Internal Medicine

## 2013-04-23 DIAGNOSIS — Z1239 Encounter for other screening for malignant neoplasm of breast: Secondary | ICD-10-CM

## 2013-04-23 LAB — TYPE AND SCREEN: Antibody Screen: NEGATIVE

## 2013-04-23 NOTE — Progress Notes (Signed)
Quick Note:  Diminutive adenoma Benign mucosa  No recall due to age ______

## 2013-04-24 ENCOUNTER — Encounter (HOSPITAL_COMMUNITY)
Admission: RE | Disposition: A | Discharge status: Home / Self Care | Payer: Self-pay | Source: Ambulatory Visit | Attending: Obstetrics & Gynecology

## 2013-04-24 ENCOUNTER — Ambulatory Visit (HOSPITAL_COMMUNITY): Payer: Medicare Other | Admitting: Certified Registered Nurse Anesthetist

## 2013-04-24 ENCOUNTER — Inpatient Hospital Stay (HOSPITAL_COMMUNITY)
Admission: RE | Admit: 2013-04-24 | Discharge: 2013-04-25 | DRG: 741 | Disposition: A | Discharge status: Home / Self Care | Payer: Medicare Other | Source: Ambulatory Visit | Attending: Obstetrics & Gynecology | Admitting: Obstetrics & Gynecology

## 2013-04-24 ENCOUNTER — Encounter (HOSPITAL_COMMUNITY): Payer: Self-pay | Admitting: *Deleted

## 2013-04-24 ENCOUNTER — Encounter (HOSPITAL_COMMUNITY): Payer: Self-pay | Admitting: Certified Registered Nurse Anesthetist

## 2013-04-24 DIAGNOSIS — C549 Malignant neoplasm of corpus uteri, unspecified: Secondary | ICD-10-CM

## 2013-04-24 DIAGNOSIS — N950 Postmenopausal bleeding: Secondary | ICD-10-CM | POA: Diagnosis present

## 2013-04-24 DIAGNOSIS — C541 Malignant neoplasm of endometrium: Secondary | ICD-10-CM | POA: Diagnosis present

## 2013-04-24 DIAGNOSIS — I10 Essential (primary) hypertension: Secondary | ICD-10-CM | POA: Diagnosis present

## 2013-04-24 DIAGNOSIS — E785 Hyperlipidemia, unspecified: Secondary | ICD-10-CM | POA: Diagnosis present

## 2013-04-24 HISTORY — PX: LYMPH NODE DISSECTION: SHX5087

## 2013-04-24 HISTORY — PX: ROBOTIC ASSISTED TOTAL HYSTERECTOMY WITH BILATERAL SALPINGO OOPHERECTOMY: SHX6086

## 2013-04-24 SURGERY — ROBOTIC ASSISTED TOTAL HYSTERECTOMY WITH BILATERAL SALPINGO OOPHORECTOMY
Anesthesia: General | Wound class: Clean Contaminated

## 2013-04-24 MED ORDER — ASPIRIN 81 MG PO CHEW
81.0000 mg | CHEWABLE_TABLET | Freq: Every day | ORAL | Status: DC
  Administered 2013-04-24 – 2013-04-25 (×2): 81 mg via ORAL
  Filled 2013-04-24 (×2): qty 1

## 2013-04-24 MED ORDER — ONDANSETRON HCL 4 MG/2ML IJ SOLN
INTRAMUSCULAR | Status: DC | PRN
  Administered 2013-04-24: 4 mg via INTRAVENOUS

## 2013-04-24 MED ORDER — HYDROMORPHONE HCL PF 1 MG/ML IJ SOLN
0.5000 mg | INTRAMUSCULAR | Status: DC | PRN
  Administered 2013-04-24: 0.5 mg via INTRAVENOUS
  Filled 2013-04-24: qty 1

## 2013-04-24 MED ORDER — KCL IN DEXTROSE-NACL 20-5-0.45 MEQ/L-%-% IV SOLN
INTRAVENOUS | Status: AC
  Filled 2013-04-24: qty 1000

## 2013-04-24 MED ORDER — PROMETHAZINE HCL 25 MG/ML IJ SOLN: 6.2500 mg | INTRAMUSCULAR | Status: DC | PRN

## 2013-04-24 MED ORDER — KCL IN DEXTROSE-NACL 20-5-0.45 MEQ/L-%-% IV SOLN
INTRAVENOUS | Status: DC
  Administered 2013-04-24 – 2013-04-25 (×2): via INTRAVENOUS
  Filled 2013-04-24 (×5): qty 1000

## 2013-04-24 MED ORDER — PHENYLEPHRINE HCL 10 MG/ML IJ SOLN
INTRAMUSCULAR | Status: DC | PRN
  Administered 2013-04-24 (×3): 80 ug via INTRAVENOUS

## 2013-04-24 MED ORDER — ACETAMINOPHEN 10 MG/ML IV SOLN: 1000.0000 mg | Freq: Once | INTRAVENOUS | Status: DC | PRN

## 2013-04-24 MED ORDER — CEFAZOLIN SODIUM-DEXTROSE 2-3 GM-% IV SOLR
2.0000 g | INTRAVENOUS | Status: AC
  Administered 2013-04-24: 2 g via INTRAVENOUS

## 2013-04-24 MED ORDER — HYDROCHLOROTHIAZIDE 12.5 MG PO CAPS
12.5000 mg | ORAL_CAPSULE | Freq: Every day | ORAL | Status: DC
  Administered 2013-04-24 – 2013-04-25 (×2): 12.5 mg via ORAL
  Filled 2013-04-24 (×2): qty 1

## 2013-04-24 MED ORDER — MEPERIDINE HCL 50 MG/ML IJ SOLN: 6.2500 mg | INTRAMUSCULAR | Status: DC | PRN

## 2013-04-24 MED ORDER — LOSARTAN POTASSIUM 50 MG PO TABS
100.0000 mg | ORAL_TABLET | Freq: Every day | ORAL | Status: DC
  Administered 2013-04-24 – 2013-04-25 (×2): 100 mg via ORAL
  Filled 2013-04-24 (×2): qty 2

## 2013-04-24 MED ORDER — LACTATED RINGERS IV SOLN
INTRAVENOUS | Status: DC
  Administered 2013-04-24: 1000 mL via INTRAVENOUS

## 2013-04-24 MED ORDER — OXYCODONE-ACETAMINOPHEN 5-325 MG PO TABS
1.0000 | ORAL_TABLET | ORAL | Status: DC | PRN
  Administered 2013-04-25 (×2): 2 via ORAL
  Filled 2013-04-24 (×2): qty 2

## 2013-04-24 MED ORDER — HYDROMORPHONE HCL PF 1 MG/ML IJ SOLN
INTRAMUSCULAR | Status: AC
  Filled 2013-04-24: qty 1

## 2013-04-24 MED ORDER — ROCURONIUM BROMIDE 100 MG/10ML IV SOLN
INTRAVENOUS | Status: DC | PRN
  Administered 2013-04-24: 50 mg via INTRAVENOUS
  Administered 2013-04-24: 20 mg via INTRAVENOUS

## 2013-04-24 MED ORDER — LACTATED RINGERS IR SOLN
Status: DC | PRN
  Administered 2013-04-24: 1000 mL

## 2013-04-24 MED ORDER — NEOSTIGMINE METHYLSULFATE 1 MG/ML IJ SOLN
INTRAMUSCULAR | Status: DC | PRN
  Administered 2013-04-24: 5 mg via INTRAVENOUS

## 2013-04-24 MED ORDER — LOSARTAN POTASSIUM-HCTZ 100-12.5 MG PO TABS: 1.0000 | ORAL_TABLET | Freq: Every morning | ORAL | Status: DC

## 2013-04-24 MED ORDER — MIDAZOLAM HCL 5 MG/5ML IJ SOLN
INTRAMUSCULAR | Status: DC | PRN
  Administered 2013-04-24: 0.5 mg via INTRAVENOUS

## 2013-04-24 MED ORDER — FLUTICASONE PROPIONATE 50 MCG/ACT NA SUSP
1.0000 | Freq: Every day | NASAL | Status: DC
  Administered 2013-04-25: 1 via NASAL
  Filled 2013-04-24: qty 16

## 2013-04-24 MED ORDER — ONDANSETRON HCL 4 MG PO TABS: 4.0000 mg | ORAL_TABLET | Freq: Four times a day (QID) | ORAL | Status: DC | PRN

## 2013-04-24 MED ORDER — OXYCODONE HCL 5 MG/5ML PO SOLN
5.0000 mg | Freq: Once | ORAL | Status: DC | PRN
  Filled 2013-04-24: qty 5

## 2013-04-24 MED ORDER — ACETAMINOPHEN 10 MG/ML IV SOLN
INTRAVENOUS | Status: DC | PRN
  Administered 2013-04-24: 1000 mg via INTRAVENOUS

## 2013-04-24 MED ORDER — EPHEDRINE SULFATE 50 MG/ML IJ SOLN
INTRAMUSCULAR | Status: DC | PRN
  Administered 2013-04-24 (×2): 10 mg via INTRAVENOUS

## 2013-04-24 MED ORDER — ONDANSETRON HCL 4 MG/2ML IJ SOLN: 4.0000 mg | Freq: Four times a day (QID) | INTRAMUSCULAR | Status: DC | PRN

## 2013-04-24 MED ORDER — HYDROMORPHONE HCL PF 1 MG/ML IJ SOLN
0.2500 mg | INTRAMUSCULAR | Status: DC | PRN
  Administered 2013-04-24: 0.5 mg via INTRAVENOUS

## 2013-04-24 MED ORDER — ACETAMINOPHEN 10 MG/ML IV SOLN
1000.0000 mg | Freq: Four times a day (QID) | INTRAVENOUS | Status: AC
  Administered 2013-04-24 – 2013-04-25 (×3): 1000 mg via INTRAVENOUS
  Filled 2013-04-24 (×4): qty 100

## 2013-04-24 MED ORDER — PROPOFOL 10 MG/ML IV BOLUS
INTRAVENOUS | Status: DC | PRN
  Administered 2013-04-24: 150 mg via INTRAVENOUS

## 2013-04-24 MED ORDER — DEXAMETHASONE SODIUM PHOSPHATE 10 MG/ML IJ SOLN
INTRAMUSCULAR | Status: DC | PRN
  Administered 2013-04-24: 10 mg via INTRAVENOUS

## 2013-04-24 MED ORDER — SUCCINYLCHOLINE CHLORIDE 20 MG/ML IJ SOLN
INTRAMUSCULAR | Status: DC | PRN
  Administered 2013-04-24: 100 mg via INTRAVENOUS

## 2013-04-24 MED ORDER — STERILE WATER FOR IRRIGATION IR SOLN
Status: DC | PRN
  Administered 2013-04-24: 3000 mL

## 2013-04-24 MED ORDER — OXYCODONE HCL 5 MG PO TABS: 5.0000 mg | ORAL_TABLET | Freq: Once | ORAL | Status: DC | PRN

## 2013-04-24 MED ORDER — LIDOCAINE HCL (CARDIAC) 20 MG/ML IV SOLN
INTRAVENOUS | Status: DC | PRN
  Administered 2013-04-24: 40 mg via INTRAVENOUS

## 2013-04-24 MED ORDER — LABETALOL HCL 5 MG/ML IV SOLN
INTRAVENOUS | Status: DC | PRN
  Administered 2013-04-24: 5 mg via INTRAVENOUS

## 2013-04-24 MED ORDER — GLYCOPYRROLATE 0.2 MG/ML IJ SOLN
INTRAMUSCULAR | Status: DC | PRN
  Administered 2013-04-24: 0.6 mg via INTRAVENOUS

## 2013-04-24 MED ORDER — FENTANYL CITRATE 0.05 MG/ML IJ SOLN
INTRAMUSCULAR | Status: DC | PRN
  Administered 2013-04-24 (×4): 50 ug via INTRAVENOUS
  Administered 2013-04-24: 100 ug via INTRAVENOUS
  Administered 2013-04-24: 50 ug via INTRAVENOUS

## 2013-04-24 MED ORDER — ZOLPIDEM TARTRATE 5 MG PO TABS: 5.0000 mg | ORAL_TABLET | Freq: Every evening | ORAL | Status: DC | PRN

## 2013-04-24 MED ORDER — AMLODIPINE BESYLATE 5 MG PO TABS
5.0000 mg | ORAL_TABLET | Freq: Every evening | ORAL | Status: DC
  Administered 2013-04-24: 5 mg via ORAL
  Filled 2013-04-24 (×2): qty 1

## 2013-04-24 SURGICAL SUPPLY — 52 items
APL SKNCLS STERI-STRIP NONHPOA (GAUZE/BANDAGES/DRESSINGS) ×2
BAG SPEC RTRVL LRG 6X4 10 (ENDOMECHANICALS) ×2
BENZOIN TINCTURE PRP APPL 2/3 (GAUZE/BANDAGES/DRESSINGS) ×3 IMPLANT
CHLORAPREP W/TINT 26ML (MISCELLANEOUS) ×4 IMPLANT
CLOTH BEACON ORANGE TIMEOUT ST (SAFETY) ×3 IMPLANT
CORD HIGH FREQUENCY UNIPOLAR (ELECTROSURGICAL) ×3 IMPLANT
CORDS BIPOLAR (ELECTRODE) ×3 IMPLANT
COVER MAYO STAND STRL (DRAPES) ×3 IMPLANT
COVER SURGICAL LIGHT HANDLE (MISCELLANEOUS) ×3 IMPLANT
COVER TIP SHEARS 8 DVNC (MISCELLANEOUS) ×2 IMPLANT
COVER TIP SHEARS 8MM DA VINCI (MISCELLANEOUS) ×1
DRAPE LG THREE QUARTER DISP (DRAPES) ×6 IMPLANT
DRAPE SURG IRRIG POUCH 19X23 (DRAPES) ×3 IMPLANT
DRAPE TABLE BACK 44X90 PK DISP (DRAPES) ×6 IMPLANT
DRAPE UTILITY XL STRL (DRAPES) ×3 IMPLANT
DRAPE WARM FLUID 44X44 (DRAPE) ×3 IMPLANT
DRSG TEGADERM 4X4.75 (GAUZE/BANDAGES/DRESSINGS) ×1 IMPLANT
DRSG TEGADERM 6X8 (GAUZE/BANDAGES/DRESSINGS) ×6 IMPLANT
ELECT REM PT RETURN 9FT ADLT (ELECTROSURGICAL) ×3
ELECTRODE REM PT RTRN 9FT ADLT (ELECTROSURGICAL) ×2 IMPLANT
GAUZE XEROFORM 2X2 STRL (GAUZE/BANDAGES/DRESSINGS) ×1 IMPLANT
GLOVE BIO SURGEON STRL SZ 6.5 (GLOVE) ×6 IMPLANT
GLOVE BIO SURGEON STRL SZ7.5 (GLOVE) ×6 IMPLANT
GLOVE BIOGEL PI IND STRL 7.0 (GLOVE) ×4 IMPLANT
GLOVE BIOGEL PI INDICATOR 7.0 (GLOVE) ×2
GOWN PREVENTION PLUS XLARGE (GOWN DISPOSABLE) ×6 IMPLANT
GOWN STRL NON-REIN LRG LVL3 (GOWN DISPOSABLE) ×9 IMPLANT
HOLDER FOLEY CATH W/STRAP (MISCELLANEOUS) ×3 IMPLANT
KIT ACCESSORY DA VINCI DISP (KITS) ×1
KIT ACCESSORY DVNC DISP (KITS) ×2 IMPLANT
MANIPULATOR UTERINE 4.5 ZUMI (MISCELLANEOUS) ×3 IMPLANT
OCCLUDER COLPOPNEUMO (BALLOONS) ×3 IMPLANT
PACK LAPAROSCOPY W LONG (CUSTOM PROCEDURE TRAY) ×3 IMPLANT
POUCH SPECIMEN RETRIEVAL 10MM (ENDOMECHANICALS) ×5 IMPLANT
SET TUBE IRRIG SUCTION NO TIP (IRRIGATION / IRRIGATOR) ×3 IMPLANT
SHEET LAVH (DRAPES) ×3 IMPLANT
SOLUTION ELECTROLUBE (MISCELLANEOUS) ×3 IMPLANT
SPONGE LAP 18X18 X RAY DECT (DISPOSABLE) IMPLANT
STRIP CLOSURE SKIN 1/2X4 (GAUZE/BANDAGES/DRESSINGS) ×3 IMPLANT
SUT VIC AB 0 CT1 27 (SUTURE) ×3
SUT VIC AB 0 CT1 27XBRD ANTBC (SUTURE) ×6 IMPLANT
SUT VIC AB 4-0 PS2 27 (SUTURE) ×6 IMPLANT
SUT VICRYL 0 UR6 27IN ABS (SUTURE) ×3 IMPLANT
SYR 50ML LL SCALE MARK (SYRINGE) ×3 IMPLANT
SYR BULB IRRIGATION 50ML (SYRINGE) IMPLANT
TOWEL OR 17X26 10 PK STRL BLUE (TOWEL DISPOSABLE) ×6 IMPLANT
TRAP SPECIMEN MUCOUS 40CC (MISCELLANEOUS) ×2 IMPLANT
TRAY FOLEY CATH 14FRSI W/METER (CATHETERS) ×3 IMPLANT
TROCAR 12M 150ML BLUNT (TROCAR) ×3 IMPLANT
TROCAR XCEL 12X100 BLDLESS (ENDOMECHANICALS) ×3 IMPLANT
TROCAR XCEL BLADELESS 5X75MML (TROCAR) ×3 IMPLANT
WATER STERILE IRR 1500ML POUR (IV SOLUTION) ×4 IMPLANT

## 2013-04-24 NOTE — Interval H&P Note (Signed)
History and Physical Interval Note:  04/24/2013 10:45 AM  Misty Alvarado  has presented today for surgery, with the diagnosis of ENDOMETRIAL CANCER  The various methods of treatment have been discussed with the patient and family. After consideration of risks, benefits and other options for treatment, the patient has consented to  Procedure(s): ROBOTIC ASSISTED TOTAL HYSTERECTOMY WITH BILATERAL SALPINGO OOPHORECTOMY, POSSIBLE LYMPH NODE DISSECTION (Bilateral) LYMPH NODE DISSECTION (N/A) as a surgical intervention .  The patient's history has been reviewed, patient examined, no change in status, stable for surgery.  I have reviewed the patient's chart and labs.  Questions were answered to the patient's satisfaction.     Maryclaire Stoecker A.

## 2013-04-24 NOTE — Preoperative (Signed)
Beta Blockers   Reason not to administer Beta Blockers:Not Applicable 

## 2013-04-24 NOTE — Op Note (Signed)
PATIENT: Misty Alvarado DATE OF BIRTH: 1933-09-18 ENCOUNTER DATE: 04/24/2013   Preop Diagnosis: grade 1 endometrioid adenocarcinoma.   Postoperative Diagnosis: same.   Surgery: Total robotic hysterectomy bilateral salpingo-oophorectomy, bilateral pelvic lymph node sampling  Surgeons:  Reinhardt Licausi A. Duard Brady, MD; Antionette Char, MD   Assistant: Telford Nab   Anesthesia: General   Estimated blood loss: 25 ml   IVF: 2000 ml   Urine output: 150 ml   Complications: None   Pathology: Uterus, cervix, bilateral tubes and ovaries, bilateral pelvic lymph nodes to pathology.   Operative findings: Frozen section revealed a grade 1 endometrial adenocarcinoma with minimal myometrial invasion. Tortuous external iliac arteries.   Procedure: The patient was identified in the preoperative holding area. Informed consent was signed on the chart. Patient was seen history was reviewed and exam was performed.   The patient was then taken to the operating room and placed in the supine position with SCD hose on. General anesthesia was then induced without difficulty. She was then placed in the dorsolithotomy position. Her arms were tucked at her side with appropriate precautions on the gel pad. Shoulder blocks were then placed in the usual fashion with appropriate precautions. A OG-tube was placed to suction. First timeout was performed to confirm the patient, procedure, antibiotic, allergy status, estimated estimated blood loss, and OR time. The perineum was then prepped in the usual fashion with Betadine. A 14 French Foley was inserted into the bladder under sterile conditions. A sterile speculum was placed in the vagina. The cervix was without lesions. The cervix was grasped with a single-tooth tenaculum. The dilator without difficulty. A ZUMI with a medium Koe ring was placed without difficulty. The abdomen was then prepped with 2 Chlor prep sponges per protocol.   Patient was then draped after the prep  was dried. Second timeout was performed to confirm the above. After again confirming OG tube placement and it was to suction. A stab-wound was made in left upper quadrant 2 cm below the costal margin on the left in the midclavicular line. A 5 mm operative report was used to assure intra-abdominal placement. The abdomen was insufflated. At this point all points during the procedure the patient's intra-abdominal pressure was not increased over 15 mm of mercury. After insufflation was complete, the patient was placed in deep Trendelenburg position. 25 cm above the pubic symphysis that area was marked the camera port. Bilateral robotic ports were marked 10 cm from the midline incision at approximately 5 angle. Under direct visualization each of the trochars was placed into the abdomen. The small bowel was folded on its mesentery to allow visualization to the pelvis. The 5 mm LUQ port was then converted to a 10/12 port under direct visualization.  After assuring adequate visualization, the robot was then docked in the usual fashion. Under direct visualization the robotic instruments were placed.   The round ligament on the patient's right side was transected with monopolar cautery. The anterior and posterior leaves of the broad ligament were then taken down in the usual fashion. The ureter was identified on the medial leaf of the broad ligament. A window was made between the IP and the ureter. The IP was coagulated with bipolar cautery and transected. The posterior leaf of the broad ligament was taken down to the level of the KOH ring. The bladder flap was created using meticulous dissection and pinpoint cautery. The uterine vessels were coagulated with bipolar cautery. The uterine vessels were then transected and the C loop was  created. The same procedure was performed on the patient's left side.   The pneumo-occulder in the vagina was then insufflated. The colpotomy was then created in the usual fashion. The  specimen was then delivered to the vagina and sent for frozen section. Our attention was then drawn to opening the paravesical space on her right side the perirectal space was also opened. The nodal bundle extending over the external iliac artery down to the external iliac vein was taken down using sharp dissection and monopolar cautery. This was difficult as the external iliac artery was deviated medially. The genitofemoral nerve was identified and spared. We were not able to identify the obturator nerve secondary to the patients anatomy and an extensive amount of intra-abdominal obesity. At this point the external and internal node bundles were placed in an endocatch bag and delivered through the port. All pedicles were noted to be hemostatic the ureter was noted to be well medial of the area of dissection. A similar procedure was performed on the patient's left side.  At this point frozen section returned as minimal myometrial invasion of grade 1 lesion. The decision was made to stop the procedure is no further lymphadenectomy was indicated.   The vaginal cuff was closed with a running 0 Vicryl on CT 1 suture. The abdomen and pelvis were copiously irrigated and noted to be hemostatic. The robotic instruments were removed under direct visualization as were the robotic trochars. The pneumoperitoneum was removed. The patient was then taken out of the Trendelenburg position. Using of 0 Vicryl on a UR 6 needle the midline port fascia was closed and the subcutaneous tissues of the port in the left upper quadrant ports were reapproximated. The skin was closed using 4-0 Vicryl. Steri-Strips and benzoin were applied. The pneumo occluded balloon was removed from the vagina. The vagina was swabbed and noted to be hemostatic.   All instrument needle and Ray-Tec counts were correct x2. The patient tolerated the procedure well and was taken to the recovery room in stable condition. This is Cleda Mccreedy dictating an  operative note on patient Misty Alvarado.

## 2013-04-24 NOTE — H&P (View-Only) (Signed)
Consult Note: Gyn-Onc   Misty Alvarado 79 y.o. female  No chief complaint on file.   Assessment : Grade 1 endometrial adenocarcinoma  Plan: I recommend the patient undergo a robotic assisted hysterectomy, bilateral salpingo-oophorectomy, and possible pelvic lymphadenectomy. We'll arrange this to be performed by Dr. Paola Gehrig.  The risks of surgery including hemorrhage, infection, injury to adjacent viscera and thrombolic complications were all outlined. All the patient's questions are answered.      HPI: The patient is seen in consultation at the request of Dr. Morris regarding management of a newly diagnosed endometrial cancer. The patient initially presented with approximately 6 weeks for postmenopausal bleeding. An endometrial biopsy is obtained. The patient denies any constitutional symptoms. She specifically denies any pelvic pain pressure or cramping and has had no weight loss. Her functional status is stable.  She has no past gynecologic history. Obstetrical history: gravida 3 she comes accompanied by her oldest daughter today.  Review of Systems:10 point review of systems is negative except as noted in interval history.   Vitals: Blood pressure 160/80, pulse 100, temperature 98.1 F (36.7 C), temperature source Oral, resp. rate 20, height 5' 6.25" (1.683 m), weight 215 lb 9.6 oz (97.796 kg).  Physical Exam: General : The patient is a healthy woman in no acute distress.  HEENT: normocephalic, extraoccular movements normal; neck is supple without thyromegally  Lynphnodes: Supraclavicular and inguinal nodes not enlarged  Abdomen: Soft, non-tender, no ascites, no organomegally, no masses, no hernias  Pelvic:  EGBUS: Normal female  Vagina: Normal, no lesions  Urethra and Bladder: Normal, non-tender  Cervix: Atrophic Uterus: Difficult to outline due to the patient's habitus. I do not think. that it is enlarged. Bi-manual examination: Non-tender; no adenxal masses or  nodularity  Rectal: normal sphincter tone, no masses, no blood  Lower extremities: No edema or varicosities. Normal range of motion      No Known Allergies  Past Medical History  Diagnosis Date  . ALLERGIC RHINITIS 10/03/2007  . HYPERLIPIDEMIA 10/03/2007  . HYPERTENSION 10/03/2007  . Ulcer     gastric    Past Surgical History  Procedure Laterality Date  . Cardiac catheterization    . Tubal ligation    . Thyroidectomy      Current Outpatient Prescriptions  Medication Sig Dispense Refill  . amLODipine (NORVASC) 5 MG tablet Take 1 tablet (5 mg total) by mouth daily.  90 tablet  4  . aspirin 81 MG tablet Take 81 mg by mouth daily.        . losartan-hydrochlorothiazide (HYZAAR) 100-12.5 MG per tablet Take 1 tablet by mouth daily.  90 tablet  4  . mometasone (NASONEX) 50 MCG/ACT nasal spray Place 2 sprays into the nose daily.  17 g  4   No current facility-administered medications for this visit.    History   Social History  . Marital Status: Widowed    Spouse Name: N/A    Number of Children: N/A  . Years of Education: N/A   Occupational History  . Not on file.   Social History Main Topics  . Smoking status: Never Smoker   . Smokeless tobacco: Never Used  . Alcohol Use: No  . Drug Use: No  . Sexually Active: Not on file   Other Topics Concern  . Not on file   Social History Narrative  . No narrative on file    Family History  Problem Relation Age of Onset  . Arthritis Mother         CLARKE-PEARSON,Kaylaann Mountz L, MD 04/02/2013, 12:31 PM        

## 2013-04-24 NOTE — Anesthesia Preprocedure Evaluation (Addendum)
Anesthesia Evaluation  Patient identified by MRN, date of birth, ID band Patient awake    Reviewed: Allergy & Precautions, H&P , NPO status , Patient's Chart, lab work & pertinent test results  Airway       Dental  (+) Dental Advisory Given   Pulmonary neg pulmonary ROS,          Cardiovascular hypertension, Pt. on medications     Neuro/Psych negative neurological ROS  negative psych ROS   GI/Hepatic Neg liver ROS, GERD-  Medicated,  Endo/Other  negative endocrine ROS  Renal/GU negative Renal ROS     Musculoskeletal negative musculoskeletal ROS (+)   Abdominal   Peds  Hematology negative hematology ROS (+)   Anesthesia Other Findings   Reproductive/Obstetrics negative OB ROS                          Anesthesia Physical Anesthesia Plan  ASA: II  Anesthesia Plan: General   Post-op Pain Management:    Induction: Intravenous  Airway Management Planned: Oral ETT  Additional Equipment:   Intra-op Plan:   Post-operative Plan: Extubation in OR  Informed Consent: I have reviewed the patients History and Physical, chart, labs and discussed the procedure including the risks, benefits and alternatives for the proposed anesthesia with the patient or authorized representative who has indicated his/her understanding and acceptance.   Dental advisory given  Plan Discussed with: CRNA  Anesthesia Plan Comments:         Anesthesia Quick Evaluation

## 2013-04-24 NOTE — Anesthesia Postprocedure Evaluation (Signed)
Anesthesia Post Note  Patient: Misty Alvarado  Procedure(s) Performed: Procedure(s) (LRB): ROBOTIC ASSISTED TOTAL HYSTERECTOMY WITH BILATERAL SALPINGO OOPHORECTOMY,  LYMPH NODE DISSECTION (Bilateral) LYMPH NODE DISSECTION (N/A)  Anesthesia type: General  Patient location: PACU  Post pain: Pain level controlled  Post assessment: Post-op Vital signs reviewed  Last Vitals: BP 137/86  Pulse 80  Temp(Src) 36.3 C (Oral)  Resp 18  Ht 5\' 6"  (1.676 m)  Wt 212 lb (96.163 kg)  BMI 34.23 kg/m2  SpO2 98%  Post vital signs: Reviewed  Level of consciousness: sedated  Complications: No apparent anesthesia complications

## 2013-04-24 NOTE — Transfer of Care (Signed)
Immediate Anesthesia Transfer of Care Note  Patient: Misty Alvarado  Procedure(s) Performed: Procedure(s) (LRB): ROBOTIC ASSISTED TOTAL HYSTERECTOMY WITH BILATERAL SALPINGO OOPHORECTOMY,  LYMPH NODE DISSECTION (Bilateral) LYMPH NODE DISSECTION (N/A)  Patient Location: PACU  Anesthesia Type: General  Level of Consciousness: sedated, patient cooperative and responds to stimulaton  Airway & Oxygen Therapy: Patient Spontanous Breathing and Patient connected to face mask oxgen  Post-op Assessment: Report given to PACU RN and Post -op Vital signs reviewed and stable  Post vital signs: Reviewed and stable  Complications: No apparent anesthesia complications

## 2013-04-25 ENCOUNTER — Encounter (HOSPITAL_COMMUNITY): Payer: Self-pay | Admitting: Gynecologic Oncology

## 2013-04-25 LAB — BASIC METABOLIC PANEL
BUN: 11 mg/dL (ref 6–23)
Calcium: 8.7 mg/dL (ref 8.4–10.5)
GFR calc Af Amer: >90 mL/min (ref >90)
GFR calc non Af Amer: 80 mL/min — ABNORMAL LOW (ref >90)
Glucose, Bld: 153 mg/dL — ABNORMAL HIGH (ref 70–99)
Sodium: 135 mEq/L (ref 135–145)

## 2013-04-25 LAB — CBC
MCH: 30.7 pg (ref 26.0–34.0)
MCHC: 35.4 g/dL (ref 30.0–36.0)
Platelets: 241 10*3/uL (ref 150–400)

## 2013-04-25 MED ORDER — OXYCODONE-ACETAMINOPHEN 5-325 MG PO TABS: 1.0000 | ORAL_TABLET | ORAL | Status: DC | PRN

## 2013-04-25 NOTE — Care Management Note (Signed)
    Page 1 of 1   04/25/2013     11:43:36 AM   CARE MANAGEMENT NOTE 04/25/2013  Patient:  Misty Alvarado, Misty Alvarado   Account Number:  [id number]  Date Initiated:  04/25/2013  Documentation initiated by:  Lorenda Ishihara  Subjective/Objective Assessment:   79 yo female admitted s/p robotic hysterectomy. PTA lived at home with family.     Action/Plan:   Home when stable   Anticipated DC Date:  04/28/2013   Anticipated DC Plan:  HOME/SELF CARE      DC Planning Services  CM consult      Choice offered to / List presented to:             Status of service:  Completed, signed off Medicare Important Message given?   (If response is "NO", the following Medicare IM given date fields will be blank) Date Medicare IM given:   Date Additional Medicare IM given:    Discharge Disposition:  HOME/SELF CARE  Per UR Regulation:  Reviewed for med. necessity/level of care/duration of stay  If discussed at Long Length of Stay Meetings, dates discussed:    Comments:

## 2013-04-25 NOTE — Discharge Summary (Signed)
Physician Discharge Summary  Patient ID: Misty Alvarado MRN: [id number] DOB/AGE: [birth year] 79 y.o.  Admit date: 04/24/2013 Discharge date: 04/25/2013  Admission Diagnoses: Endometrial cancer  Discharge Diagnoses:  Principal Problem:   Endometrial cancer  Discharged Condition:  The patient is in good condition and stable for discharge.  Hospital Course: On 04/24/2013, the patient underwent the following: Procedure(s):  ROBOTIC ASSISTED TOTAL HYSTERECTOMY WITH BILATERAL SALPINGO OOPHORECTOMY,  LYMPH NODE DISSECTION LYMPH NODE DISSECTION.  The postoperative course was uneventful.  She was discharged to home on postoperative day 1 tolerating a regular diet.  Consults: None  Significant Diagnostic Studies: None  Treatments: surgery: see above  Discharge Exam: Blood pressure 116/66, pulse 78, temperature 97.8 F (36.6 C), temperature source Oral, resp. rate 16, height 5\' 6"  (1.676 m), weight 212 lb (96.163 kg), SpO2 98.00%. General appearance: alert, cooperative and no distress Resp: clear to auscultation bilaterally Cardio: regular rate and rhythm, S1, S2 normal, no murmur, click, rub or gallop GI: soft, non-tender; bowel sounds normal; no masses,  no organomegaly Extremities: extremities normal, atraumatic, no cyanosis or edema Incision/Wound: Lap sites to abdomen x 4, dressings removed, steri strips clean, dry, and intact Small, bug bite-like area to the left upper arm mildly erythematous with no signs of infection  Disposition:  Home   Discharge Orders   Future Appointments Provider Department Dept Phone   05/09/2013 11:30 AM Jeannette Corpus, MD [facility] [phone]   Future Orders Complete By Expires     Call MD for:  difficulty breathing, headache or visual disturbances  As directed     Call MD for:  extreme fatigue  As directed     Call MD for:  hives  As directed     Call MD for:  persistant dizziness or light-headedness  As  directed     Call MD for:  persistant nausea and vomiting  As directed     Call MD for:  redness, tenderness, or signs of infection (pain, swelling, redness, odor or green/yellow discharge around incision site)  As directed     Call MD for:  severe uncontrolled pain  As directed     Call MD for:  temperature >100.4  As directed     Diet - low sodium heart healthy  As directed     Driving Restrictions  As directed     Comments:      No driving for 2 weeks.  Do not take narcotics and drive.    Increase activity slowly  As directed     Lifting restrictions  As directed     Comments:      No lifting greater than 10 lbs.    Sexual Activity Restrictions  As directed     Comments:      No sexual activity, nothing in the vagina, for 8 weeks.        Medication List    TAKE these medications       amLODipine 5 MG tablet  Commonly known as:  NORVASC  Take 5 mg by mouth every evening.     aspirin 81 MG tablet  Take 81 mg by mouth daily.     losartan-hydrochlorothiazide 100-12.5 MG per tablet  Commonly known as:  HYZAAR  Take 1 tablet by mouth every morning.     mometasone 50 MCG/ACT nasal spray  Commonly known as:  NASONEX  Place 2 sprays into the nose daily as needed (for allergies).     oxyCODONE-acetaminophen  5-325 MG per tablet  Commonly known as:  PERCOCET/ROXICET  Take 1-2 tablets by mouth every 4 (four) hours as needed (moderate to severe pain).           Follow-up Information   Follow up with Jeannette Corpus, MD On 05/09/2013. (at 11:30am)    Contact information:   [address] [place] [address] [phone]       Signed: Huan Pollok DEAL 04/25/2013, 9:21 AM

## 2013-04-25 NOTE — Progress Notes (Signed)
Utilization review completed.  

## 2013-04-25 NOTE — Progress Notes (Signed)
Pt for d/c home today with dau. IV d/c'd. SS to abdomen CDI. Ambulated on hallway & tolerated diet and denies N/V. Discharge instructions & RX given with verbalized understanding. Dau's at bedside to assist with d/c

## 2013-04-27 ENCOUNTER — Telehealth: Payer: Self-pay | Admitting: *Deleted

## 2013-04-27 NOTE — Telephone Encounter (Signed)
No answer

## 2013-05-03 ENCOUNTER — Telehealth: Payer: Self-pay | Admitting: *Deleted

## 2013-05-03 DIAGNOSIS — K644 Residual hemorrhoidal skin tags: Secondary | ICD-10-CM

## 2013-05-03 MED ORDER — HYDROCORTISONE ACE-PRAMOXINE 1-1 % RE FOAM: 1.0000 | Freq: Two times a day (BID) | RECTAL | Status: DC

## 2013-05-04 ENCOUNTER — Encounter: Payer: Self-pay | Admitting: *Deleted

## 2013-05-04 NOTE — Progress Notes (Signed)
Rx Anusol 2.5% 30 gram tube # 1 apply to affected area BID. One refill called to [facility]@ [phone]

## 2013-05-09 ENCOUNTER — Encounter: Payer: Self-pay | Admitting: Gynecology

## 2013-05-09 ENCOUNTER — Ambulatory Visit: Payer: Medicare Other | Attending: Gynecology | Admitting: Gynecology

## 2013-05-09 VITALS — BP 122/78 | HR 66 | Temp 98.6°F | Resp 20 | Ht 66.25 in | Wt 205.4 lb

## 2013-05-09 DIAGNOSIS — I10 Essential (primary) hypertension: Secondary | ICD-10-CM | POA: Insufficient documentation

## 2013-05-09 DIAGNOSIS — Z90710 Acquired absence of both cervix and uterus: Secondary | ICD-10-CM | POA: Insufficient documentation

## 2013-05-09 DIAGNOSIS — C569 Malignant neoplasm of unspecified ovary: Secondary | ICD-10-CM

## 2013-05-09 DIAGNOSIS — C549 Malignant neoplasm of corpus uteri, unspecified: Secondary | ICD-10-CM | POA: Insufficient documentation

## 2013-05-09 DIAGNOSIS — K219 Gastro-esophageal reflux disease without esophagitis: Secondary | ICD-10-CM | POA: Insufficient documentation

## 2013-05-09 DIAGNOSIS — Z7982 Long term (current) use of aspirin: Secondary | ICD-10-CM | POA: Insufficient documentation

## 2013-05-09 DIAGNOSIS — E785 Hyperlipidemia, unspecified: Secondary | ICD-10-CM | POA: Insufficient documentation

## 2013-05-09 DIAGNOSIS — Z9079 Acquired absence of other genital organ(s): Secondary | ICD-10-CM | POA: Insufficient documentation

## 2013-05-09 DIAGNOSIS — Z79899 Other long term (current) drug therapy: Secondary | ICD-10-CM | POA: Insufficient documentation

## 2013-05-09 NOTE — Progress Notes (Signed)
Consult Note: Gyn-Onc   Misty Alvarado 79 y.o. female  Chief Complaint  Patient presents with  . 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  Plan: Recommend the patient received 6 cycles of carboplatin and Taxol chemotherapy followed by vaginal vault brachytherapy.  Interval History: The patient underwent a robotic hysterectomy, BSO and pelvic lymphadenectomy on Apr 24 2013. Final pathology showed a papillary serous carcinoma invading 5 mm of a 15 mm myometrium. Angiolymphatic invasion was noted. 7 lymph nodes were negative. The patient's had an uncomplicated postoperative course.  HPI: Patient initially presented with postmenopausal bleeding. A biopsy showed endometrial cancer and she underwent a robotic assisted hysterectomy, BSO, and pelvic lymphadenectomy on Apr 24 2013. Final pathology showed a stage I a papillary serous endometrial carcinoma with lymph vascular invasion.  Review of Systems:10 point review of systems is negative except as noted in interval history.   Vitals: Blood pressure 122/78, pulse 66, temperature 98.6 F (37 C), resp. rate 20, height 5' 6.25" (1.683 m), weight 205 lb 6.4 oz (93.169 kg).  Physical Exam: General : The patient is a healthy woman in no acute distress.  HEENT: normocephalic, extraoccular movements normal; neck is supple without thyromegally  Lynphnodes: Supraclavicular and inguinal nodes not enlarged  Abdomen: Soft, non-tender, no ascites, no organomegally, no masses, no hernias all incisions are healing well Pelvic:   Deferred Lower extremities: No edema or varicosities. Normal range of motion      No Known Allergies  Past Medical History  Diagnosis Date  . ALLERGIC RHINITIS 10/03/2007  . HYPERLIPIDEMIA 10/03/2007  . HYPERTENSION 10/03/2007  . Ulcer     gastric  . Endometrial cancer     new diagnosis 03/02/13  . GERD (gastroesophageal  reflux disease)     "ONCE IN A WHILE"  TUMS IF NEEDED  . Arthritis     KNEES    Past Surgical History  Procedure Laterality Date  . Cardiac catheterization    . Tubal ligation    . Lithotripsy      for kidney stone  . Partial thyroidectomy for a growth      YRS AGO   . Robotic assisted total hysterectomy with bilateral salpingo oopherectomy Bilateral 04/24/2013    Procedure: ROBOTIC ASSISTED TOTAL HYSTERECTOMY WITH BILATERAL SALPINGO OOPHORECTOMY,  LYMPH NODE DISSECTION;  Surgeon: Rejeana Brock A. Duard Brady, MD;  Location: [facility];  Service: Gynecology;  Laterality: Bilateral;  . Lymph node dissection N/A 04/24/2013    Procedure: LYMPH NODE DISSECTION;  Surgeon: Rejeana Brock A. Duard Brady, MD;  Location: [facility];  Service: Gynecology;  Laterality: N/A;    Current Outpatient Prescriptions  Medication Sig Dispense Refill  . amLODipine (NORVASC) 5 MG tablet Take 5 mg by mouth every evening.      Marland Kitchen aspirin 81 MG tablet Take 81 mg by mouth daily.       . hydrocortisone-pramoxine (PROCTOFOAM-HC) rectal foam Place 1 applicator rectally 2 (two) times daily.  10 g  2  . losartan-hydrochlorothiazide (HYZAAR) 100-12.5 MG per tablet Take 1 tablet by mouth every morning.      . mometasone (NASONEX) 50 MCG/ACT nasal spray Place 2 sprays into the nose daily as needed (for allergies).      Marland Kitchen oxyCODONE-acetaminophen (PERCOCET/ROXICET) 5-325 MG per tablet Take 1-2 tablets by mouth every 4 (four) hours as needed (moderate to severe pain).  40 tablet  0   No current facility-administered medications for  this visit.    History   Social History  . Marital Status: Widowed    Spouse Name: N/A    Number of Children: N/A  . Years of Education: N/A   Occupational History  . Not on file.   Social History Main Topics  . Smoking status: Never Smoker   . Smokeless tobacco: Never Used  . Alcohol Use: No  . Drug Use: No  . Sexually Active: Not on file   Other Topics Concern  . Not on file   Social History Narrative  . No  narrative on file    Family History  Problem Relation Age of Onset  . Arthritis Mother   . Colon cancer Neg Hx       CLARKE-PEARSON,Karrisa Didio L, MD 05/09/2013, 11:49 AM

## 2013-05-09 NOTE — Patient Instructions (Signed)
We'll arrange for an appointment in medical oncology for consultation to receive chemotherapy. Following 6 cycles of chemotherapy we will plan radiation therapy using vaginal brachytherapy

## 2013-05-15 ENCOUNTER — Encounter: Payer: Self-pay | Admitting: *Deleted

## 2013-05-15 NOTE — Progress Notes (Signed)
Appointment with Dr Truett Perna obtained for 05/22/12 @ 1400/arrive @ 1330. Same conveyed to pt and her daughter [name] and accept.

## 2013-05-15 NOTE — Progress Notes (Signed)
Arranged appt with Dr Truett Perna 05/22/13 @ 1400 arrive @ 1330 for initiation of chemo.  Pt and daughter aware and accept.

## 2013-05-15 NOTE — Telephone Encounter (Signed)
Rx sent to pharmacy   

## 2013-05-22 ENCOUNTER — Encounter: Payer: Self-pay | Admitting: *Deleted

## 2013-05-22 ENCOUNTER — Telehealth: Payer: Self-pay | Admitting: Oncology

## 2013-05-22 ENCOUNTER — Encounter: Payer: Self-pay | Admitting: Oncology

## 2013-05-22 ENCOUNTER — Other Ambulatory Visit: Payer: Medicare Other

## 2013-05-22 ENCOUNTER — Other Ambulatory Visit: Payer: Self-pay | Admitting: Oncology

## 2013-05-22 DIAGNOSIS — C541 Malignant neoplasm of endometrium: Secondary | ICD-10-CM

## 2013-05-22 NOTE — Telephone Encounter (Signed)
S/W PT DTR([name]) IN RE TO NP APPT 06/11 @ 2:30 W/DR. LIVESAY, CHEMO EDU ON 06/10 @ 5.  APPTS CONFIRMED.

## 2013-05-22 NOTE — Progress Notes (Signed)
Medical Oncology  Per new patient scheduler, put on my office for 05-23-13 as requested by gyn oncology. I have requested chemo education prior if possible.  L.Curtina Grills MD

## 2013-05-22 NOTE — Telephone Encounter (Signed)
C/D 05/22/13 for appt. 05/23/13 °

## 2013-05-23 ENCOUNTER — Ambulatory Visit (HOSPITAL_BASED_OUTPATIENT_CLINIC_OR_DEPARTMENT_OTHER): Payer: Medicare Other

## 2013-05-23 ENCOUNTER — Telehealth: Payer: Self-pay | Admitting: Oncology

## 2013-05-23 ENCOUNTER — Ambulatory Visit (HOSPITAL_BASED_OUTPATIENT_CLINIC_OR_DEPARTMENT_OTHER): Payer: Medicare Other | Admitting: Oncology

## 2013-05-23 ENCOUNTER — Encounter: Payer: Self-pay | Admitting: Oncology

## 2013-05-23 VITALS — BP 163/86 | HR 97 | Temp 98.7°F | Resp 20 | Ht 66.25 in | Wt 209.8 lb

## 2013-05-23 DIAGNOSIS — C541 Malignant neoplasm of endometrium: Secondary | ICD-10-CM

## 2013-05-23 DIAGNOSIS — C549 Malignant neoplasm of corpus uteri, unspecified: Secondary | ICD-10-CM

## 2013-05-23 LAB — COMPREHENSIVE METABOLIC PANEL (CC13)
Albumin: 3.7 g/dL (ref 3.5–5.0)
Alkaline Phosphatase: 70 U/L (ref 40–150)
Calcium: 9.1 mg/dL (ref 8.4–10.4)
Chloride: 104 mEq/L (ref 98–107)
Glucose: 111 mg/dl — ABNORMAL HIGH (ref 70–99)
Potassium: 3.9 mEq/L (ref 3.5–5.1)
Sodium: 139 mEq/L (ref 136–145)
Total Protein: 7 g/dL (ref 6.4–8.3)

## 2013-05-23 LAB — CBC WITH DIFFERENTIAL/PLATELET
Eosinophils Absolute: 0.6 10*3/uL — ABNORMAL HIGH (ref 0.0–0.5)
HGB: 14.1 g/dL (ref 11.6–15.9)
MCV: 88.2 fL (ref 79.5–101.0)
MONO#: 0.5 10*3/uL (ref 0.1–0.9)
MONO%: 8.1 % (ref 0.0–14.0)
NEUT#: 3.3 10*3/uL (ref 1.5–6.5)
RBC: 4.66 10*6/uL (ref 3.70–5.45)
RDW: 12.3 % (ref 11.2–14.5)
WBC: 6.7 10*3/uL (ref 3.9–10.3)
lymph#: 2.2 10*3/uL (ref 0.9–3.3)

## 2013-05-23 MED ORDER — ONDANSETRON HCL 8 MG PO TABS: 8.0000 mg | ORAL_TABLET | Freq: Two times a day (BID) | ORAL | Status: DC | PRN

## 2013-05-23 MED ORDER — DEXAMETHASONE 4 MG PO TABS: ORAL_TABLET | ORAL | Status: DC

## 2013-05-23 MED ORDER — LORAZEPAM 0.5 MG PO TABS
ORAL_TABLET | ORAL | Status: DC
Start: 2013-05-23 — End: 2013-08-15

## 2013-05-23 NOTE — Telephone Encounter (Signed)
Gave pt appt for lab, MD and chemo for june and July 2014 °

## 2013-05-23 NOTE — Progress Notes (Signed)
Checked in new patient. No financial issues. I did not ask if living will/POA. [name] her daughter wants calls to work phone 1st. (she works for [employer]). They want mail and phone communication now. 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

## 2013-05-23 NOTE — Patient Instructions (Addendum)
Call any time if questions or concerns    [phone]   First chemotherapy will be Wed 05-30-13 at 1130.  The night before chemo each time you will take 2 doses of steroid (decadron, dexamethasone) with food ~ 12 hours and ~ 6 hours before chemo. For this first treatment, you will need to eat a little and take five tablets of the steroid (= 20 mg) at ~ 11:30 pm on Tues 6-17, then eat something and take five more tablets at ~ 5:30 in AM on Wed 6-18.  Night of chemo, whether or not any nausea, take one of the Ativan (lorazepam) tablets at bedtime. AM after chemo, whether or not any nausea, take one of the zofran (ondansetron) tablets. Other than these 2 doses, you can use nausea medicine according to directions if any nausea   We will send prescriptions to your pharmacy for  1. Decadron (dexamethasone, steroid) 4 mg tablets 2. Ativan (lorazepam) 0.5 mg tablets  For nausea, will make you sleepy 3.Zofran (ondansetron) 8 mg   For nausea, will not make you drowsy

## 2013-05-24 ENCOUNTER — Telehealth: Payer: Self-pay | Admitting: *Deleted

## 2013-05-24 NOTE — Telephone Encounter (Signed)
Left message to call back  

## 2013-05-26 ENCOUNTER — Encounter: Payer: Self-pay | Admitting: Oncology

## 2013-05-26 NOTE — Progress Notes (Signed)
[facility] NEW PATIENT EVALUATION   Name: Misty Alvarado Date: 6/11/2014MRN: [id number] DOB: [birth year]  REFERRING PHYSICIAN: D.ClarkePearson CC: Misty Boga, Misty Alvarado, Misty Alvarado , Misty Alvarado    REASON FOR REFERRAL: IA papillary serous endometrial carcinoma with LVSI, for adjuvant chemotherapy    HISTORY OF PRESENT ILLNESS:Misty Alvarado is a 79 y.o. female who is seen in consultation at request of Misty Alvarado and Misty Alvarado for consideration of adjuvant chemotherapy due to high risk features of recently diagnosed IA endometrial cancer. She is accompanied by daughter Misty Roberts, RN, for entirety of visit today.  Patient does not have PAC, however peripheral venous access has been easily accomplished with interventions thus far.  Patient had been in usual good health when she developed several weeks of vaginal spotting, without other symptoms. She saw Misty Misty Alvarado at [facility], with endometrial biopsy 03-02-2012 [phone]) with grade 1 endometroid adenocarcinoma. She was referred to Misty Alvarado, his exam not remarkable otherwise. She had colonoscopy by Misty Misty Alvarado on 04-23-13, with a diminutive adenoma and otherwise benign. She had CXR preoperatively 04-20-2013 with chronic bronchitic and interstitial changes and degenerative changes of spine, otherwise not remarkable; I do not believe that she had other imaging of abdomen/ pelvis. She was taken to total robotic hysterectomy with BSO and bilateral pelvic node evaluation by Misty Misty Alvarado on 04-24-2013.Post operative course was uncomplicated and she was discharged home on POD 1. Pathology [phone]) found high grade papillary serous carcinoma tumor size 3.5 cm with myometrial invasion 0.5 cm where myometrium was 1.5 cm, with focal angiolymphatic invasion, 6 right pelvic and 1 left pelvic node negative, for T1aN0 staging. She saw Misty Alvarado in follow up on 05-09-13, with recommendation for 6 cycles of  adjuvant taxol/ carboplatin, which daughter recalls was mentioned as every 3 weeks, followed by vaginal vault brachytherapy. She and daughter attended chemotherapy teaching class prior to visit today; she has not had radiation oncology consultation as yet.  REVIEW OF SYSTEMS:  At usual weight. Eating well now. Some constipation post op which has resolved, bowels moving well without medication now. No HA, needs new glasses. + environmental allergies, otherwise no respiratory symptoms. No cardiac symptoms. Bladder ok. No fever. No swelling LE. Did not use lovenox post op. Arthritis in knees limits activity, tho energy is good. Is able to sleep.  ALLERGIES: Review of patient's allergies indicates no known allergies.  PAST MEDICAL HISTORY:  has a past medical history of ALLERGIC RHINITIS (10/03/2007); HYPERLIPIDEMIA (10/03/2007); HYPERTENSION (10/03/2007); Ulcer; Endometrial cancer; GERD (gastroesophageal reflux disease); and Arthritis.   HTN x years, elevated lipids Colonoscopy 04-17-2013 Misty Alvarado  Remote lithotripsy Back surgery 40 yrs ago Arthritis especially bothersome in knees. Mammograms Breast Alvarado 04-24-2013 Bone density 05-03-13 normal   CURRENT MEDICATIONS: reviewed as listed in EMR. Preferred pharmacy is [facility]   SOCIAL HISTORY:  reports that she has never smoked. She has never used smokeless tobacco. She reports that she does not drink alcohol or use illicit drugs. Originally from [place], in [place] since [address]. Retired from work as Location manager. WIdowed x 30 years and lived alone prior to surgery. 3 children: 1 daughter local, 1 in [place] and 1 in [place], and 2 grandchildren.Patient has been staying with daughter Misty Alvarado since surgery. Daughter lives locally and is RN in [employer] system, presently with the internal medicine teaching program and previously at [employer] [facility].  FAMILY HISTORY: family history includes Arthritis in her mother.   There is no history  of Colon cancer. 2 brothers and 1 sister with lung cancer. Daughters with HTN and thyroid disease.   LABORATORY DATA:  CBC today with WBC 6.7, ANC 3.3, Hgb 14.1 and plt 293k. MCV 88. Eos absolute 0.6 consistent with allergy history  CMET available after visit entirely normal with exception of nonfasting glucose of 111, including normal electrolytes, BUN 11.9 and creatinine 1.1 (had been 0.7 - 0.8 previously) T prot 7.0 and alb 3.7 RADIOGRAPHY:  CHEST - 2 VIEW 04-20-2013 Comparison: None available  Findings: Normal heart size and vascularity. Chronic bronchitic  and interstitial prominence throughout the lungs. No definite  focal pneumonia, collapse, consolidation, edema, effusion,  pneumothorax. Degenerative changes of the spine.  IMPRESSION:  Chronic bronchitic and interstitial changes.  DIGITAL BILATERAL SCREENING MAMMOGRAM WITH CAD 04-24-2013 Comparison: None.  FINDINGS:  ACR Breast Density Category 2: There is a scattered fibroglandular  pattern.  No suspicious masses, architectural distortion, or calcifications  are present.  Images were processed with CAD.  IMPRESSION:  No mammographic evidence of malignancy.  A result letter of this screening mammogram will be mailed directly  to the patient.  RECOMMENDATION:  Screening mammogram in one year. (Code:SM-B-01Y)  BI-RADS CATEGORY 1: Negative.      PATHOLOGY  [name], Alvarado Collected: 03/02/2013 Client: [facility] Accession: [id number] Received: 03/02/2013 Misty Spain, Misty Alvarado REPORT OF SURGICAL PATHOLOGAL DIAGNOSIS Diagnosis Endometrium, biopsy - ENDOMETRIOID ADENOCARCINOMA - SEE COMMENT. Microscopic Comment The carcinomas has papillary/villous architecture and appears FIGO grade I. Misty. Hollice Alvarado has reviewed the case and is in essential agreement with this interpretation. The case was discussed with Misty. Langston Alvarado on 03/05/2013. (Misty Alvarado 03/05/13) Misty Leisure  Misty Alvarado   Surgical Path  Accession #: [id number] Collected Date: 04/24/2013 Received Date: 05/13/2014INALDIAGNOSIS Diagnosis 1. Uterus, ovaries and fallopian tubes, with cervix - PAPILLARY SEROUS CARCINOMA, INVADING INTO UNDERLYING MYOMETRIUM WITH FOCAL ANGIOLYMPHATIC PRESENT. - CERVIX: SQUAMOUS MUCOSA AND ENDOCERVICAL MUCOSA, NO DYSPLASIA OR MALIGNANCY. - BILATERAL FALLOPIAN TUBES AND OVARIES: NO PATHOLOGIC ABNORMALITIES. 2. Lymph nodes, regional resection, right pelvic - SIX LYMPH NODE, NEGATIVE FOR METASTATIC CARCINOMA (0/6). 3. Lymph nodes, regional resection, left pelvic - ONE LYMPH NODE AND MATURE ADIPOSE TISSUE, NO EVIDENCE OF MALIGNANCY (0/1). Microscopic Comment 1. ONCOLOGY TABLE-UTERUS, CARCINOMA Specimen: Uterus, cervix, bilateral ovaries and fallopian tubes Procedure: Total hysterectomy and bilateral salpingo-oophorectomy Lymph node sampling performed: Yes Specimen integrity: Intact Maximum tumor size: 3.5 cm, gross measurement Histologic type: Papillary serous carcinoma Grade: High grade Myometrial invasion: 0.5 cm where myometrium is 1.5 cm in thickness Cervical stromal involvement: No Extent of involvement of other organs: No Lymph - vascular invasion: Present Peritoneal washings: N/A Lymph nodes: # examined 7 ; # positive 0 TNM code: pT1a, pN0 FIGO Stage (based on pathologic findings, needs clinical correlation): IA Comments: The malignant cells are arranged in papillary pattern with associated mitotic activity and necrosis. A battery of immunostains are performed and the malignant cells show the following immunoprofile: p16 - strongly and diffusely positive p53 - strongly and diffusely positive Ki-67 - positive, highlights more than 80% of the tumor cells ER - pathy weakly positive PR - focal weakly positive Controls stained appropriately. The overall morphologic and phenotypic features are diagnostic for high grade papillary serous carcinoma. Clinical  correlation is highly recommended. Misty. Laureen Ochs agrees. (HCL:caf 04/25/13) (HCL:caf 04/26/13) Gwendolyn Grant LI Misty Alvarado  Intraoperative Diagnosis 1. UTERUS, FROZEN SECTION DIAGNOSIS: NO DEEP MYOMETRIAL INVASION. (JDP) Specimen Gross and Clinical Information       PHYSICAL EXAM:  height  is 5' 6.25" (1.683 m) and weight is 209 lb 12.8 oz (95.165 kg). Her oral temperature is 98.7 F (37.1 C). Her blood pressure is 163/86 and her pulse is 97. Her respiration is 20.  Well-appearing lady looks stated age, difficulty ambulating length of halls due to knee problems. Daughter assists with history and is very supportive. HEENT: normal hair pattern, PERRL, not icteric, oral mucosa moist and clear. Lungs clear to A & P Heart RRR Abdomen soft, normal bowel sounds, not distended, not tender. Incisions from robotic procedure healed. No appreciable HSM or mass. LE no edema, cords, tenderness Breasts without dominant masses, skin or nipple findings Lymphatics: no cervical, supraclavicular, axillary or inguinal adenopathy Neuro nonfocal Skin without rash or ecchymosis   We have discussed all of history as above, surgical and pathology information and rationale from the pathology findings to recommend additional chemotherapy and vaginal vault brachytherapy. We have discussed q 3 week vs dose dense weekly administration of carboplatin and taxol; they are in agreement with starting treatment with every 3 week dosing. Daughter's work will accommodate treatments best midday, and granddaughter can also help with transportation. All questions from the chemotherapy teaching class have been answered. I have reminded them of need for premedication decadron 20 mg 12 hrs and 6 hrs prior to taxol, with food. I have also specifically discussed possibility of taxol aches.  I have given them written and oral instructions for the antiemetics. They understand how to contact this office at any time if needed. Patient and daughter have had  questions answered to their satisfaction and are in agreement with plan.    IMPRESSION / PLAN:  1.T1aN0 grade 3 papillary serous endometrial carcinoma with + angiolymphatic invasion, in 79 yo lady who appears generally healthy and has recovered well from surgery on 04-24-2013.  She will begin chemotherapy with cycle 1 taxol carbo on 05-30-13, planning q 21 day regimen. I will see her at least 6-25 with counts. We will set up consultation with Misty Roselind Messier also. 2.degenerative arthritis knees 3.unremarkable colonoscopy and mammograms within past month 4.HTN x years 5.allergic rhinitis   Crimson Dubberly P, Misty Alvarado 05/23/2013

## 2013-05-30 ENCOUNTER — Other Ambulatory Visit (HOSPITAL_BASED_OUTPATIENT_CLINIC_OR_DEPARTMENT_OTHER): Payer: Medicare Other | Admitting: Lab

## 2013-05-30 ENCOUNTER — Ambulatory Visit (HOSPITAL_BASED_OUTPATIENT_CLINIC_OR_DEPARTMENT_OTHER): Payer: Medicare Other

## 2013-05-30 VITALS — BP 155/94 | HR 93 | Temp 97.6°F | Resp 18

## 2013-05-30 DIAGNOSIS — Z5111 Encounter for antineoplastic chemotherapy: Secondary | ICD-10-CM

## 2013-05-30 DIAGNOSIS — C541 Malignant neoplasm of endometrium: Secondary | ICD-10-CM

## 2013-05-30 DIAGNOSIS — C549 Malignant neoplasm of corpus uteri, unspecified: Secondary | ICD-10-CM

## 2013-05-30 LAB — CBC WITH DIFFERENTIAL/PLATELET
EOS%: 0 % (ref 0.0–7.0)
Eosinophils Absolute: 0 10*3/uL (ref 0.0–0.5)
MCH: 30.1 pg (ref 25.1–34.0)
MCV: 86.9 fL (ref 79.5–101.0)
MONO%: 0.4 % (ref 0.0–14.0)
NEUT#: 7 10*3/uL — ABNORMAL HIGH (ref 1.5–6.5)
RBC: 4.88 10*6/uL (ref 3.70–5.45)
RDW: 12.3 % (ref 11.2–14.5)
nRBC: 0 % (ref 0–0)

## 2013-05-30 MED ORDER — ONDANSETRON 16 MG/50ML IVPB (CHCC)
16.0000 mg | Freq: Once | INTRAVENOUS | Status: AC
  Administered 2013-05-30: 16 mg via INTRAVENOUS

## 2013-05-30 MED ORDER — DIPHENHYDRAMINE HCL 50 MG/ML IJ SOLN
50.0000 mg | Freq: Once | INTRAMUSCULAR | Status: AC
  Administered 2013-05-30: 50 mg via INTRAVENOUS

## 2013-05-30 MED ORDER — DEXAMETHASONE SODIUM PHOSPHATE 20 MG/5ML IJ SOLN
20.0000 mg | Freq: Once | INTRAMUSCULAR | Status: AC
  Administered 2013-05-30: 20 mg via INTRAVENOUS

## 2013-05-30 MED ORDER — FAMOTIDINE IN NACL 20-0.9 MG/50ML-% IV SOLN
20.0000 mg | Freq: Once | INTRAVENOUS | Status: AC
Start: 2013-05-30 — End: 2013-05-30
  Administered 2013-05-30: 20 mg via INTRAVENOUS

## 2013-05-30 MED ORDER — PACLITAXEL CHEMO INJECTION 300 MG/50ML
175.0000 mg/m2 | Freq: Once | INTRAVENOUS | Status: AC
  Administered 2013-05-30: 372 mg via INTRAVENOUS
  Filled 2013-05-30: qty 62

## 2013-05-30 MED ORDER — SODIUM CHLORIDE 0.9 % IV SOLN
447.0000 mg | Freq: Once | INTRAVENOUS | Status: AC
  Administered 2013-05-30: 450 mg via INTRAVENOUS
  Filled 2013-05-30: qty 45

## 2013-05-30 MED ORDER — SODIUM CHLORIDE 0.9 % IV SOLN
Freq: Once | INTRAVENOUS | Status: AC
  Administered 2013-05-30: 11:00:00 via INTRAVENOUS

## 2013-05-30 NOTE — Progress Notes (Signed)
Taxol rate increased to 235mls/hr for 51 mls.  Pt stable.

## 2013-05-30 NOTE — Patient Instructions (Addendum)
[facility] Discharge Instructions for Patients Receiving Chemotherapy  Today you received the following chemotherapy agents Taxol/Carboplatin To help prevent nausea and vomiting after your treatment, we encourage you to take your nausea medication as prescribed.  If you develop nausea and vomiting that is not controlled by your nausea medication, call the clinic.   BELOW ARE SYMPTOMS THAT SHOULD BE REPORTED IMMEDIATELY:  *FEVER GREATER THAN 100.5 F  *CHILLS WITH OR WITHOUT FEVER  NAUSEA AND VOMITING THAT IS NOT CONTROLLED WITH YOUR NAUSEA MEDICATION  *UNUSUAL SHORTNESS OF BREATH  *UNUSUAL BRUISING OR BLEEDING  TENDERNESS IN MOUTH AND THROAT WITH OR WITHOUT PRESENCE OF ULCERS  *URINARY PROBLEMS  *BOWEL PROBLEMS  UNUSUAL RASH Items with * indicate a potential emergency and should be followed up as soon as possible.  Feel free to call the clinic you have any questions or concerns. The clinic phone number is [phone].    

## 2013-05-30 NOTE — Progress Notes (Signed)
Taxol rate remains 282mls/hr for remainder of medication.  Pt stable

## 2013-05-31 ENCOUNTER — Telehealth: Payer: Self-pay | Admitting: *Deleted

## 2013-05-31 NOTE — Telephone Encounter (Signed)
NO PROBLEMS OR QUESTIONS AT THIS TIME. SHE IS EATING WELL AND FORCING FLUIDS. LAST NORMAL BOWEL MOVEMENT WAS LAST NIGHT. NO ISSUES WITH PT. MOUTH. CHEMOTHERAPY INFORMATION SHEET IS AVAILABLE FOR REFERENCE. PT. WILL CALL THIS OFFICE OR THE ON CALL PHYSICIAN IF THE NEED ARISES.

## 2013-06-04 ENCOUNTER — Emergency Department (HOSPITAL_COMMUNITY)
Admission: EM | Admit: 2013-06-04 | Discharge: 2013-06-04 | Discharge status: Against Medical Advice | Payer: Medicare Other | Attending: Emergency Medicine | Admitting: Emergency Medicine

## 2013-06-04 ENCOUNTER — Other Ambulatory Visit: Payer: Self-pay | Admitting: Oncology

## 2013-06-04 DIAGNOSIS — C549 Malignant neoplasm of corpus uteri, unspecified: Secondary | ICD-10-CM | POA: Insufficient documentation

## 2013-06-04 DIAGNOSIS — I10 Essential (primary) hypertension: Secondary | ICD-10-CM | POA: Insufficient documentation

## 2013-06-04 DIAGNOSIS — F29 Unspecified psychosis not due to a substance or known physiological condition: Secondary | ICD-10-CM | POA: Insufficient documentation

## 2013-06-04 NOTE — Progress Notes (Signed)
Patient's daughter called to report that she is becoming "pschychotic."  She is manic, accusing her son in law of stealing and thinking that he was the [place].    She was recently given steroid premed for chemo Carboplatin/Taxol and then also more this past weekend due to myalgia/athralgia.    I advised her that this will improve being off of dexamethasone. However, she did not feel comfortable with her mom home.  She will bring patient to ED tonight.  I told her that I will alert Dr. Darrold Span of these issues.

## 2013-06-04 NOTE — ED Notes (Signed)
Daughter states that she wants to take her mother home and follow up with her primary care physician in the am; Daughter states that the multiple people and the wait are making the psychosis worse; RN advised that leaving AMA was their choose and that we would be happy to see the pt; Daughter states "I think we can manage tonight and not make it worse by sitting here"; Pt and daughter left AMA

## 2013-06-05 ENCOUNTER — Telehealth: Payer: Self-pay

## 2013-06-05 ENCOUNTER — Telehealth: Payer: Self-pay | Admitting: Internal Medicine

## 2013-06-05 ENCOUNTER — Encounter: Payer: Self-pay | Admitting: *Deleted

## 2013-06-05 ENCOUNTER — Telehealth: Payer: Self-pay | Admitting: Oncology

## 2013-06-05 MED ORDER — HALOPERIDOL 1 MG PO TABS: 1.0000 mg | ORAL_TABLET | Freq: Three times a day (TID) | ORAL | Status: DC | PRN

## 2013-06-05 NOTE — Telephone Encounter (Signed)
Attempted to call that number is busy Patient will need ED visit for psych evaluation;  a direct admission we'll not be possible without the ED/psych evaluation

## 2013-06-05 NOTE — Telephone Encounter (Signed)
Daughter Misty Alvarado called stating that she spoke to an admitting counselor at [facility] and she was told that the best way to handle her mother was for Dr. Darrold Span to admit Misty Alvarado to [facility] and request a Psych Consult  Please call back as soon as possible regarding this matter.

## 2013-06-05 NOTE — Telephone Encounter (Signed)
Spoke with Ms. Adkinson and told her that Dr. Darrold Span feels that Misty Alvarado needs to be evaluated by behavorial health and the best way would be to bring her mother to [facility] ED and be evaluated. Ms. Alfredo Bach verbalized understanding.

## 2013-06-05 NOTE — Telephone Encounter (Signed)
[name]/Daughter Phone [phone] and alt # [phone].  Called regarding chemo medication caused a psychotic episode.  No contact. First line was busy.  Left message on alternative number  to call back if assistance still required.

## 2013-06-05 NOTE — Telephone Encounter (Signed)
Medical Oncology  MD unable to reach daughter on work line x 3 (has rung at least 60x) or home phone x1 (left message). Will continue to try to reach her if possible in follow up of problems with patient. Patient needs evaluation in ED, and from there may need admission and may need psych evaluation either in ED or in hospital.  Ila Mcgill, MD
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
## 2013-06-05 NOTE — Progress Notes (Signed)
GYN Location of Tumor / Histology: endometrium  Patient presented 3 months ago with symptoms of: post menopausal bleeding  Biopsies of endometrium (if applicable) revealed: endometroid adenocarcinoma Surgical Path  Accession #: [id number]  Collected Date: 04/24/2013 Received Date: 05/13/2014INALDIAGNOSIS  Diagnosis  1. Uterus, ovaries and fallopian tubes, with cervix  - PAPILLARY SEROUS CARCINOMA, INVADING INTO UNDERLYING MYOMETRIUM WITH FOCAL  ANGIOLYMPHATIC PRESENT.  - CERVIX: SQUAMOUS MUCOSA AND ENDOCERVICAL MUCOSA, NO DYSPLASIA OR  MALIGNANCY.  - BILATERAL FALLOPIAN TUBES AND OVARIES: NO PATHOLOGIC ABNORMALITIES.  2. Lymph nodes, regional resection, right pelvic  - SIX LYMPH NODE, NEGATIVE FOR METASTATIC CARCINOMA (0/6).  3. Lymph nodes, regional resection, left pelvic  - ONE LYMPH NODE AND MATURE ADIPOSE TISSUE, NO EVIDENCE OF MALIGNANCY (0/1).   Past/Anticipated interventions by Gyn/Onc surgery, if any: 04/24/13 robotic hysterectomy, BSO, pelvic lymphadenectomy  Past/Anticipated interventions by medical oncology, if any: adjuvant chemotherapy- q3 week Carbo/Taxol, 1st chemo 05/30/13  Weight changes, if any: none  Bowel/Bladder complaints, if any: none, constipation-resolved  Nausea/Vomiting, if any: none  Pain issues, if any:  denies  SAFETY ISSUES:  Prior radiation? no  Pacemaker/ICD? no  Possible current pregnancy? na  Is the patient on methotrexate? 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 She is fatigued.

## 2013-06-05 NOTE — Telephone Encounter (Signed)
Medical Oncology  This MD reached daughter Marin Roberts at work # now. I have strongly recommended that patient be evaluated in ED, with Behavioral Health assessment if appropriate. While the decadron may have exacerbated the agitation, it should not still be causing the symptoms now out over 72 hours from last dose. Daughter understands and tells me that they will take patient to ED.  Ila Mcgill, MD

## 2013-06-05 NOTE — Telephone Encounter (Signed)
Spoke to [place] pt's daughter told her pt will need to go to ED for psych evaluation a direct admission will not be possible without the ED/psyche eval. [name] verbalized understanding and stated she did not know why and then I had Dr. Amador Cunas get on the phone and discuss situation with him. Rx called into pharmacy.

## 2013-06-06 ENCOUNTER — Other Ambulatory Visit (HOSPITAL_BASED_OUTPATIENT_CLINIC_OR_DEPARTMENT_OTHER): Payer: Medicare Other | Admitting: Lab

## 2013-06-06 ENCOUNTER — Ambulatory Visit
Admission: RE | Admit: 2013-06-06 | Discharge: 2013-06-06 | Disposition: A | Discharge status: Home / Self Care | Payer: Medicare Other | Source: Ambulatory Visit | Attending: Radiation Oncology | Admitting: Radiation Oncology

## 2013-06-06 ENCOUNTER — Encounter: Payer: Self-pay | Admitting: Radiation Oncology

## 2013-06-06 ENCOUNTER — Telehealth: Payer: Self-pay | Admitting: Internal Medicine

## 2013-06-06 ENCOUNTER — Telehealth: Payer: Self-pay | Admitting: Oncology

## 2013-06-06 ENCOUNTER — Ambulatory Visit (HOSPITAL_BASED_OUTPATIENT_CLINIC_OR_DEPARTMENT_OTHER): Payer: Medicare Other | Admitting: Oncology

## 2013-06-06 ENCOUNTER — Encounter: Payer: Self-pay | Admitting: Oncology

## 2013-06-06 VITALS — BP 141/95 | HR 94 | Temp 97.0°F | Resp 18 | Ht 66.0 in | Wt 205.3 lb

## 2013-06-06 VITALS — BP 136/85 | HR 102 | Temp 98.2°F | Resp 20 | Wt 205.5 lb

## 2013-06-06 DIAGNOSIS — Z9079 Acquired absence of other genital organ(s): Secondary | ICD-10-CM | POA: Insufficient documentation

## 2013-06-06 DIAGNOSIS — C541 Malignant neoplasm of endometrium: Secondary | ICD-10-CM

## 2013-06-06 DIAGNOSIS — Z79899 Other long term (current) drug therapy: Secondary | ICD-10-CM | POA: Insufficient documentation

## 2013-06-06 DIAGNOSIS — K219 Gastro-esophageal reflux disease without esophagitis: Secondary | ICD-10-CM | POA: Insufficient documentation

## 2013-06-06 DIAGNOSIS — E785 Hyperlipidemia, unspecified: Secondary | ICD-10-CM | POA: Insufficient documentation

## 2013-06-06 DIAGNOSIS — Z90710 Acquired absence of both cervix and uterus: Secondary | ICD-10-CM | POA: Insufficient documentation

## 2013-06-06 DIAGNOSIS — I10 Essential (primary) hypertension: Secondary | ICD-10-CM

## 2013-06-06 DIAGNOSIS — C549 Malignant neoplasm of corpus uteri, unspecified: Secondary | ICD-10-CM

## 2013-06-06 DIAGNOSIS — F22 Delusional disorders: Secondary | ICD-10-CM

## 2013-06-06 LAB — CBC WITH DIFFERENTIAL/PLATELET
BASO%: 0 % (ref 0.0–2.0)
MCHC: 34.2 g/dL (ref 31.5–36.0)
MONO#: 0.1 10*3/uL (ref 0.1–0.9)
RBC: 4.57 10*6/uL (ref 3.70–5.45)
WBC: 3.4 10*3/uL — ABNORMAL LOW (ref 3.9–10.3)
lymph#: 1.6 10*3/uL (ref 0.9–3.3)

## 2013-06-06 MED ORDER — FILGRASTIM 480 MCG/0.8ML IJ SOLN
480.0000 ug | Freq: Once | INTRAMUSCULAR | Status: AC
  Administered 2013-06-06: 480 ug via SUBCUTANEOUS
  Filled 2013-06-06: qty 0.8

## 2013-06-06 NOTE — Progress Notes (Signed)
Please see the Nurse Progress Note in the MD Initial Consult Encounter for this patient. 

## 2013-06-06 NOTE — Telephone Encounter (Signed)
Gave pt appt for lab ,injections ,MD on June and July 2014, emailed Marcelino Duster regarding chemo for July 2014

## 2013-06-06 NOTE — Telephone Encounter (Signed)
To: [place] (After Hours Triage) Fax: [phone] From: [facility] Date/ Time: 06/05/2013 7:38 PM Taken By: Karenann Cai, RN Caller: [name] Facility: Not Collected Patient: Takeyah, Wieman DOB: 1933-01-10 Phone: [phone] Reason for Call: Caller was unable to be reached on callback - Left Message Regarding Appointment: No Appt Date: Appt Time: Unknown Provider: Reason: Details: Outcome:

## 2013-06-06 NOTE — Progress Notes (Signed)
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 female who is seen out courtesy of Dr. Darrold Span for an opinion concerning radiation therapy as part of management of patient's recently diagnosed endometrial cancer. Patient had been in usual good health when she developed several weeks of vaginal spotting, without other symptoms. She saw Dr Langston Masker at [facility], with endometrial biopsy 03-02-2012 [id number]) with grade 1 endometroid adenocarcinoma. She was referred to Dr Yolande Jolly, his exam not remarkable otherwise. She had colonoscopy by Dr Leone Payor on 04-23-13, with a diminutive adenoma and otherwise benign. She had CXR preoperatively 04-20-2013 with chronic bronchitic and interstitial changes and degenerative changes of spine, otherwise not remarkable.  She was taken to total robotic hysterectomy with BSO and bilateral pelvic node evaluation by Dr Duard Brady on 04-24-2013.Post operative course was uncomplicated and she was discharged home on POD 1. Pathology [phone]) found high grade papillary serous carcinoma tumor size 3.5 cm with myometrial invasion 0.5 cm where myometrium was 1.5 cm, with focal angiolymphatic invasion, 6 right pelvic and 1 left pelvic node negative, for T1aN0 staging. She saw Dr Yolande Jolly in follow up on 05-09-13, with recommendation for 6 cycles of adjuvant taxol/ carboplatin followed by vaginal vault brachytherapy. The patient has completed her first cycle of chemotherapy and is now seen for consideration for intracavitary brachytherapy as part of her overall management.  PREVIOUS RADIATION THERAPY:  No  PAST MEDICAL HISTORY:  has a past medical history of ALLERGIC RHINITIS (10/03/2007); HYPERLIPIDEMIA (10/03/2007); HYPERTENSION (10/03/2007); Ulcer; GERD (gastroesophageal reflux disease); Arthritis; and Endometrial cancer.    PAST SURGICAL HISTORY: Past Surgical History  Procedure Laterality Date  . Cardiac catheterization    . Tubal ligation    . Lithotripsy      for kidney stone  . Partial thyroidectomy for a growth      YRS AGO   . Robotic assisted total hysterectomy with bilateral salpingo oopherectomy Bilateral 04/24/2013    Procedure: ROBOTIC ASSISTED TOTAL HYSTERECTOMY WITH BILATERAL SALPINGO OOPHORECTOMY,  LYMPH NODE DISSECTION;  Surgeon: Rejeana Brock A. Duard Brady, MD;  Location: [facility];  Service: Gynecology;  Laterality: Bilateral;  . Lymph node dissection N/A 04/24/2013    Procedure: LYMPH NODE DISSECTION;  Surgeon: Rejeana Brock A. Duard Brady, MD;  Location: [facility];  Service: Gynecology;  Laterality: N/A;  . Abdominal hysterectomy      FAMILY HISTORY: family history includes Arthritis in her mother and Cancer in her brother and sister.  There is no history of Colon cancer.  SOCIAL HISTORY:  reports that she has never smoked. She has never used smokeless tobacco. She reports that she does not drink alcohol or use illicit drugs.  ALLERGIES: Dexamethasone  MEDICATIONS:  Current Outpatient Prescriptions  Medication Sig Dispense Refill  . amLODipine (NORVASC) 5 MG tablet Take 5 mg by mouth every evening.      Marland Kitchen aspirin 81 MG tablet Take 81 mg by mouth daily.       Marland Kitchen dexamethasone (DECADRON) 4 MG tablet Take 5 tabs = 20 mg with food 12 hrs. and 6 hrs. Prior to taxol chemotherapy  10 tablet  0  . haloperidol (HALDOL) 1 MG tablet Take 1 tablet (1 mg  total) by mouth 3 (three) times daily as needed.  30 tablet  0  . HYDROcodone-acetaminophen (NORCO) 10-325 MG per tablet Take 1 tablet by mouth every 6 (six) hours as needed for pain.      . hydrocortisone-pramoxine (PROCTOFOAM-HC) rectal foam Place 1  applicator rectally 2 (two) times daily.  10 g  2  . LORazepam (ATIVAN) 0.5 MG tablet Take 1-2 tabs  under the tongue or swallow  Every 6 hrs. As needed for nausea.  Will make you drowsy  20 tablet  0  . losartan-hydrochlorothiazide (HYZAAR) 100-12.5 MG per tablet Take 1 tablet by mouth every morning.      . mometasone (NASONEX) 50 MCG/ACT nasal spray Place 2 sprays into the nose daily as needed (for allergies).      . ondansetron (ZOFRAN) 8 MG tablet Take 1-2 tablets (8-16 mg total) by mouth every 12 (twelve) hours as needed for nausea (Will not make you drowsy).  20 tablet  2   No current facility-administered medications for this encounter.    REVIEW OF SYSTEMS:  A 15 point review of systems is documented in the electronic medical record. This was obtained by the nursing staff. However, I reviewed this with the patient to discuss relevant findings and make appropriate changes. The patient presented with mild vaginal bleeding as above. She denies any vaginal discharge or bleeding at this time. She has been feeling poorly since initiation of her chemotherapy.  the patient has some feelings of helplessness since she cannot care for herself very well at this time.  She is not driving presently and her daughter is helping with groceries etc.  she denies any breathing problems low back pain flank pain or pelvic pain. She denies any urinary difficulties or bowel complaints.   PHYSICAL EXAM:  weight is 205 lb 8 oz (93.214 kg). Her oral temperature is 98.2 F (36.8 C). Her blood pressure is 136/85 and her pulse is 102. Her respiration is 20.   BP 136/85  Pulse 102  Temp(Src) 98.2 F (36.8 C) (Oral)  Resp 20  Wt 205 lb 8 oz (93.214 kg)  BMI 32.91 kg/m2  General Appearance:    Alert, cooperative, no distress, appears stated age, she has somewhat of a flat affect today, she is somewhat teary eyed  Head:    Normocephalic, without obvious abnormality, atraumatic  Eyes:    PERRL, conjunctiva/corneas  clear, EOM's intact      Nose:   Nares normal, septum midline, mucosa normal, no drainage    or sinus tenderness  Throat:   Lips, mucosa, and tongue normal;  gums normal, dentures in place   Neck:   Supple, symmetrical, trachea midline, no adenopathy;   Scar and lower neck from prior thyroid surgery.     Back:     Symmetric, no curvature, ROM normal, no CVA tenderness, small scar from lumbar back surgery   Lungs:     Clear to auscultation bilaterally, respirations unlabored  Chest Wall:    No tenderness or deformity   Heart:    Regular rate and rhythm        Abdomen:     Soft, non-tender, bowel sounds active all four quadrants,    no masses, no organomegaly  Genitalia:    deferred until simulation and planning day   Rectal:    deferred until simulation and planning day   Extremities:   Extremities normal, atraumatic, no cyanosis or edema  Pulses:   2+ and symmetric all extremities  Skin:   Skin color, texture, turgor normal, no rashes or lesions  Lymph nodes:   Cervical, supraclavicular, and axillary nodes normal  Neurologic:    normal strength, sensation and reflexes    throughout    LABORATORY DATA:  Lab Results  Component Value Date   WBC 7.8 05/30/2013   HGB 14.7 05/30/2013   HCT 42.4 05/30/2013   MCV 86.9 05/30/2013   PLT 303 05/30/2013   Lab Results  Component Value Date   NA 139 05/23/2013   K 3.9 05/23/2013   CL 104 05/23/2013   CO2 26 05/23/2013   Lab Results  Component Value Date   ALT 13 05/23/2013   AST 16 05/23/2013   ALKPHOS 70 05/23/2013   BILITOT 0.59 05/23/2013     RADIOGRAPHY: No results found.    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 The patient would be a good candidate for intracavitary brachytherapy treatments as part of her overall management. I discussed the treatment course side effects and potential toxicities of radiation therapy in this situation with the patient and her daughter. She appears to understand and wishes  to to proceed with planned course of treatment. I did discuss consideration for proceeding with brachytherapy during the patient's chemotherapy but given the patient's performance status at this time the daughter does not feel this would be a possibility.    PLAN: The patient will be set up for reevaluation towards the end of her chemotherapy for planning of her radiation treatments. I anticipate approximately 5 intracavitary treatments. I spent 60 minutes minutes face to face with the patient and more than 50% of that time was spent in counseling and/or coordination of care.   ------------------------------------------------  -----------------------------------  Billie Lade, PhD, MD

## 2013-06-06 NOTE — Progress Notes (Signed)
OFFICE PROGRESS NOTE   06/06/2013   Physicians:D.ClarkePearson,P. KWIATKOWSKI, Megan Morris , C.Gessner   INTERVAL HISTORY:   Patient is seen, together with daughter [name], in follow up of cycle 1 taxol carboplatin which was given 05-30-13, using q 3 week regimen. She has had a very difficult time over past 4-5 days with what may have been steroid psychosis, apparently better today on Haldol begun by PCP Dr Frederica Kuster. Chemotherapy administration was uncomplicated at [facility] on 05-30-13, patient using premedication decadron 20  Mg 12 hrs and 6 hrs prior, then usual 20 mg IV prior to start of treatment. She noticed aches in legs evening of day 2, which were more severe by day 3. Daughter contacted [facility] on call MD that pm, with 2 additional doses of decadron 20 mg given 6-20 PM and 6-21 AM, as well as hydrocodone. Daughter contacted [facility] on 06-04-13 to report agitated and paranoid type behavior, which daughter indicated was exacerbation of previous behavioral tendencies. Family spoke  took patient to ED on 06-04-13 PM, where she was so agitated that they left without being seen. We had several phone conversations with daughter on 06-05-13, with recommendation that she be evaluated in ED including possible Behavioral Health evaluation, however they preferred not to do this; daughter spoke with PCP with same recommendation per this EMR. Patient began Haldol from PCP late in day yesterday, total 3 doses thus far. She saw Dr Roselind Messier in consultation prior to my visit today. She does not have central catheter.   ONCOLOGIC HISTORY Patient had been in usual good health when she developed several weeks of vaginal spotting in early 2014, without other symptoms. She saw Dr Langston Masker at [facility], with endometrial biopsy 03-02-2012 [phone]) with grade 1 endometroid adenocarcinoma. She was referred to Dr Yolande Jolly, his exam not remarkable otherwise. She had colonoscopy by Dr Leone Payor on 04-23-13, with a  diminutive adenoma and otherwise benign. She had CXR preoperatively 04-20-2013 with chronic bronchitic and interstitial changes and degenerative changes of spine, otherwise not remarkable; I do not believe that she had other imaging of abdomen/ pelvis. She was taken to total robotic hysterectomy with BSO and bilateral pelvic node evaluation by Dr Duard Brady on 04-24-2013.Post operative course was uncomplicated and she was discharged home on POD 1. 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 She saw Dr Yolande Jolly in follow up on 05-09-13, with recommendation for 6 cycles of adjuvant taxol/ carboplatin followed by vaginal vault brachytherapy.  Patient reports feeling very tired today. She has had no nausea; daughter gave antiemetics in preventive fashion for some # of doses after chemo and we have discussed using those only if symptoms (zofran and ativan). Bowels have been moving. She has some slight, intermittent tingling in fingers, none in feet. She is no longer aching. She has had no fever or symptoms of infection. She does not sleep well, chronic. Remainder of 10 point Review of Systems negative.  We did not discuss details of the problems with agitation/ paranoia summarized above during the visit.  Objective:  Vital signs in last 24 hours:  BP 141/95  Pulse 94  Temp(Src) 97 F (36.1 C) (Oral)  Resp 18  Ht 5\' 6"  (1.676 m)  Wt 205 lb 4.8 oz (93.123 kg)  BMI 33.15 kg/m2 Awake, alert, mostly quiet tho some conversation with speech fluent  and appropriate. Cooperative with exam, makes eye contact. Affect similar to that at my consultation visit. Ambulatory a little slowly due to knees, but without assistance. Daughter assists with some history and their interactions are congenial.   HEENT:PERRLA,  extra ocular movement intact, sclera clear, anicteric and oropharynx clear, no lesions . No alopecia. LymphaticsCervical, supraclavicular, and axillary nodes normal. No inguinal adenopathy Resp: clear to auscultation bilaterally and normal percussion bilaterally, respirations not labored Cardio: regular rate and rhythm no gallop GI: soft, non-tender; bowel sounds normal; no masses,  no organomegaly Surgical incisions not remarkable from robotic procedure. Extremities: extremities normal, atraumatic, no cyanosis or edema Neuro:no sensory deficits noted Skin without rash or ecchymosis  Lab Results:  Results for orders placed in visit on 06/06/13  CBC WITH DIFFERENTIAL      Result Value Range   WBC 3.4 (*) 3.9 - 10.3 10e3/uL   NEUT# 1.4 (*) 1.5 - 6.5 10e3/uL   HGB 13.8  11.6 - 15.9 g/dL   HCT 40.9  81.1 - 91.4 %   Platelets 202  145 - 400 10e3/uL   MCV 88.4  79.5 - 101.0 fL   MCH 30.2  25.1 - 34.0 pg   MCHC 34.2  31.5 - 36.0 g/dL   RBC 7.82  9.56 - 2.13 10e6/uL   RDW 12.4  11.2 - 14.5 %   lymph# 1.6  0.9 - 3.3 10e3/uL   MONO# 0.1  0.1 - 0.9 10e3/uL   Eosinophils Absolute 0.3  0.0 - 0.5 10e3/uL   Basophils Absolute 0.0  0.0 - 0.1 10e3/uL   NEUT% 41.6  38.4 - 76.8 %   LYMPH% 46.2  14.0 - 49.7 %   MONO% 2.6  0.0 - 14.0 %   EOS% 9.6 (*) 0.0 - 7.0 %   BASO% 0.0  0.0 - 2.0 %    Counts not at nadir, day 8 cycle 1 today.   Studies/Results:  No results found.  Medications: I have reviewed the patient's current medications. We have discussed gCSF and she has been given neupogen 480 mcg today; she will have this also 6-26 and 06-08-13, with repeat CBC on 6-27 and further neupogen set up then if needed. Neupogen chosen in preference to neulasta particularly due to concern for aches.  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 ANC dropping, no significant drop yet in platelets or  hgb. Begin neupogen ~ 5 mcg/kg daily thru 6-27 or longer depending on CBC then. I will see her again 06-13-13 and she will be due cycle 2 on July 9 with gCSF probably best to begin 5-7 days after chemotherapy due to taxol aches. With problems with cycle 1, we may decrease premedication decadron to 20 mg 12 hours prior + IV dose with chemo, and consider decreasing taxol to 135 mg/m2 instead of 175. Vaginal brachytherapy by Dr Roselind Messier could be given during chemo if tolerated, otherwise after chemo completes. 2.agitation and paranoia reported x several days after treatment including additional steroids for taxol aches. Has not been evaluated by [facility], but apparently Haldol by PCP is helping. Will minimize steroids as possible, but some decadron is necessary with taxol. Per daughter, this behavior may be exacerbation of previous tendencies.  3.unremarkable colonoscopy and mammograms within past month  4.HTN x years  5.degenerative arthritis knees 6.allergic rhinitis  Patient and daughter verbalize agreement with plan above.  Reece Packer, MD   06/06/2013,  9:32 PM

## 2013-06-07 ENCOUNTER — Telehealth: Payer: Self-pay | Admitting: *Deleted

## 2013-06-07 ENCOUNTER — Ambulatory Visit (HOSPITAL_BASED_OUTPATIENT_CLINIC_OR_DEPARTMENT_OTHER): Payer: Medicare Other

## 2013-06-07 ENCOUNTER — Telehealth: Payer: Self-pay | Admitting: Oncology

## 2013-06-07 VITALS — BP 156/90 | HR 101 | Temp 97.9°F

## 2013-06-07 DIAGNOSIS — C541 Malignant neoplasm of endometrium: Secondary | ICD-10-CM

## 2013-06-07 DIAGNOSIS — C549 Malignant neoplasm of corpus uteri, unspecified: Secondary | ICD-10-CM

## 2013-06-07 DIAGNOSIS — Z5189 Encounter for other specified aftercare: Secondary | ICD-10-CM

## 2013-06-07 MED ORDER — FILGRASTIM 480 MCG/0.8ML IJ SOLN
480.0000 ug | Freq: Once | INTRAMUSCULAR | Status: AC
  Administered 2013-06-07: 480 ug via SUBCUTANEOUS
  Filled 2013-06-07: qty 0.8

## 2013-06-07 NOTE — Telephone Encounter (Signed)
Called pt and left message regarding all appts for July 2014 , lab, chemo and MD visit

## 2013-06-07 NOTE — Patient Instructions (Addendum)
Filgrastim, G-CSF injection What is this medicine? FILGRASTIM, G-CSF (fil GRA stim) stimulates the formation of white blood cells. This medicine is given to patients with conditions that may cause a decrease in white blood cells, like those receiving certain types of chemotherapy or bone marrow transplant. It helps the bone marrow recover its ability to produce white blood cells. Increasing the amount of white blood cells helps to decrease the risk of infection and fever. This medicine may be used for other purposes; ask your health care provider or pharmacist if you have questions. What should I tell my health care provider before I take this medicine? They need to know if you have any of these conditions: -currently receiving radiation therapy -sickle cell disease -an unusual or allergic reaction to filgrastim, E. coli protein, other medicines, foods, dyes, or preservatives -pregnant or trying to get pregnant -breast-feeding How should I use this medicine? This medicine is for injection into a vein or injection under the skin. It is usually given by a health care professional in a hospital or clinic setting. If you get this medicine at home, you will be taught how to prepare and give this medicine. Always change the site for the injection under the skin. Let the solution warm to room temperature before you use it. Do not shake the solution before you withdraw a dose. Throw away any unused portion. Use exactly as directed. Take your medicine at regular intervals. Do not take your medicine more often than directed. It is important that you put your used needles and syringes in a special sharps container. Do not put them in a trash can. If you do not have a sharps container, call your pharmacist or healthcare provider to get one. Talk to your pediatrician regarding the use of this medicine in children. While this medicine may be prescribed for children for selected conditions, precautions do  apply. Overdosage: If you think you have taken too much of this medicine contact a poison control center or emergency room at once. NOTE: This medicine is only for you. Do not share this medicine with others. What if I miss a dose? Try not to miss doses. If you miss a dose take the dose as soon as you remember. If it is almost time for the next dose, do not take double doses unless told to by your doctor or health care professional. What may interact with this medicine? -lithium -medicines for cancer chemotherapy This list may not describe all possible interactions. Give your health care provider a list of all the medicines, herbs, non-prescription drugs, or dietary supplements you use. Also tell them if you smoke, drink alcohol, or use illegal drugs. Some items may interact with your medicine. What should I watch for while using this medicine? Visit your doctor or health care professional for regular checks on your progress. If you get a fever or any sign of infection while you are using this medicine, do not treat yourself. Check with your doctor or health care professional. Bone pain can usually be relieved by mild pain relievers such as acetaminophen or ibuprofen. Check with your doctor or health care professional before taking these medicines as they may hide a fever. Call your doctor or health care professional if the aches and pains are severe or do not go away. What side effects may I notice from receiving this medicine? Side effects that you should report to your doctor or health care professional as soon as possible: -allergic reactions like skin rash, itching   or hives, swelling of the face, lips, or tongue -difficulty breathing, wheezing -fever -pain, redness, or swelling at the injection site -stomach or side pain, or pain at the shoulder Side effects that usually do not require medical attention (report to your doctor or health care professional if they continue or are  bothersome): -bone pain (ribs, lower back, breast bone) -headache -skin rash This list may not describe all possible side effects. Call your doctor for medical advice about side effects. You may report side effects to FDA at 1-800-FDA-1088. Where should I keep my medicine? Keep out of the reach of children. Store in a refrigerator between 2 and 8 degrees C (36 and 46 degrees F). Do not freeze or leave in direct sunlight. If vials or syringes are left out of the refrigerator for more than 24 hours, they must be thrown away. Throw away unused vials after the expiration date on the carton. NOTE: This sheet is a summary. It may not cover all possible information. If you have questions about this medicine, talk to your doctor, pharmacist, or health care provider.  2013, [employer]. (02/14/2008 1:33:21 PM)  

## 2013-06-07 NOTE — Telephone Encounter (Signed)
Per staff message and POF I have scheduled appts.  JMW  

## 2013-06-08 ENCOUNTER — Other Ambulatory Visit: Payer: Self-pay | Admitting: Oncology

## 2013-06-08 ENCOUNTER — Other Ambulatory Visit (HOSPITAL_BASED_OUTPATIENT_CLINIC_OR_DEPARTMENT_OTHER): Payer: Medicare Other | Admitting: Lab

## 2013-06-08 ENCOUNTER — Telehealth: Payer: Self-pay | Admitting: Oncology

## 2013-06-08 ENCOUNTER — Ambulatory Visit (HOSPITAL_BASED_OUTPATIENT_CLINIC_OR_DEPARTMENT_OTHER): Payer: Medicare Other

## 2013-06-08 VITALS — BP 161/98 | HR 109 | Temp 97.0°F | Resp 18

## 2013-06-08 DIAGNOSIS — Z5189 Encounter for other specified aftercare: Secondary | ICD-10-CM

## 2013-06-08 DIAGNOSIS — C541 Malignant neoplasm of endometrium: Secondary | ICD-10-CM

## 2013-06-08 DIAGNOSIS — C549 Malignant neoplasm of corpus uteri, unspecified: Secondary | ICD-10-CM

## 2013-06-08 LAB — CBC WITH DIFFERENTIAL/PLATELET
Eosinophils Absolute: 0.3 10*3/uL (ref 0.0–0.5)
HCT: 39.5 % (ref 34.8–46.6)
LYMPH%: 48.2 % (ref 14.0–49.7)
MCHC: 33.9 g/dL (ref 31.5–36.0)
MONO#: 0.6 10*3/uL (ref 0.1–0.9)
NEUT#: 0.7 10*3/uL — ABNORMAL LOW (ref 1.5–6.5)
NEUT%: 22.3 % — ABNORMAL LOW (ref 38.4–76.8)
Platelets: 213 10*3/uL (ref 145–400)
WBC: 3 10*3/uL — ABNORMAL LOW (ref 3.9–10.3)

## 2013-06-08 MED ORDER — FILGRASTIM 480 MCG/0.8ML IJ SOLN
480.0000 ug | Freq: Once | INTRAMUSCULAR | Status: AC
  Administered 2013-06-08: 480 ug via SUBCUTANEOUS
  Filled 2013-06-08: qty 0.8

## 2013-06-08 NOTE — Telephone Encounter (Signed)
lvm for pt regarding to 7.27.14 appt.Marland KitchenMarland Kitchen

## 2013-06-08 NOTE — Progress Notes (Signed)
Patient in today for Neupogen, labs reviewed by Dr Darrold Span, she would like patient to have 1 additional injection on Saturday 6/28. Appt given for 1045, given to patient and grandaughter. Neutropenic precautions discussed.

## 2013-06-08 NOTE — Patient Instructions (Signed)
Discussed Neutropenic precautions. Call if Temp>101.0.

## 2013-06-09 ENCOUNTER — Ambulatory Visit: Payer: Medicare Other

## 2013-06-09 ENCOUNTER — Ambulatory Visit (HOSPITAL_BASED_OUTPATIENT_CLINIC_OR_DEPARTMENT_OTHER): Payer: Medicare Other

## 2013-06-09 VITALS — BP 165/94 | HR 102 | Temp 97.9°F | Resp 18

## 2013-06-09 DIAGNOSIS — C549 Malignant neoplasm of corpus uteri, unspecified: Secondary | ICD-10-CM

## 2013-06-09 DIAGNOSIS — Z5189 Encounter for other specified aftercare: Secondary | ICD-10-CM

## 2013-06-09 MED ORDER — FILGRASTIM 480 MCG/0.8ML IJ SOLN
480.0000 ug | Freq: Once | INTRAMUSCULAR | Status: AC
  Administered 2013-06-09: 480 ug via SUBCUTANEOUS

## 2013-06-11 ENCOUNTER — Telehealth: Payer: Self-pay | Admitting: *Deleted

## 2013-06-11 NOTE — Telephone Encounter (Signed)
Pt called to confirm appointments.

## 2013-06-13 ENCOUNTER — Ambulatory Visit (HOSPITAL_BASED_OUTPATIENT_CLINIC_OR_DEPARTMENT_OTHER): Payer: Medicare Other | Admitting: Oncology

## 2013-06-13 ENCOUNTER — Telehealth: Payer: Self-pay | Admitting: Oncology

## 2013-06-13 ENCOUNTER — Encounter: Payer: Self-pay | Admitting: Oncology

## 2013-06-13 ENCOUNTER — Other Ambulatory Visit (HOSPITAL_BASED_OUTPATIENT_CLINIC_OR_DEPARTMENT_OTHER): Payer: Medicare Other | Admitting: Lab

## 2013-06-13 VITALS — BP 152/89 | HR 102 | Temp 98.2°F | Resp 20 | Ht 66.0 in | Wt 202.0 lb

## 2013-06-13 DIAGNOSIS — C549 Malignant neoplasm of corpus uteri, unspecified: Secondary | ICD-10-CM

## 2013-06-13 DIAGNOSIS — C541 Malignant neoplasm of endometrium: Secondary | ICD-10-CM

## 2013-06-13 LAB — COMPREHENSIVE METABOLIC PANEL (CC13)
ALT: 27 U/L (ref 0–55)
AST: 23 U/L (ref 5–34)
CO2: 27 mEq/L (ref 22–29)
Creatinine: 0.9 mg/dL (ref 0.6–1.1)
Total Bilirubin: 0.49 mg/dL (ref 0.20–1.20)

## 2013-06-13 LAB — CBC WITH DIFFERENTIAL/PLATELET
BASO%: 1.1 % (ref 0.0–2.0)
EOS%: 1.6 % (ref 0.0–7.0)
HCT: 39.4 % (ref 34.8–46.6)
LYMPH%: 25.1 % (ref 14.0–49.7)
MCH: 30.4 pg (ref 25.1–34.0)
MCHC: 34.4 g/dL (ref 31.5–36.0)
NEUT%: 55.1 % (ref 38.4–76.8)
Platelets: 172 10*3/uL (ref 145–400)

## 2013-06-13 NOTE — Progress Notes (Signed)
OFFICE PROGRESS NOTE   06/13/2013   Physicians::D.ClarkePearson, J.Kinard, P. 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 Major problem following the first chemotherapy was extreme agitation and paranoia, which may have been steroid psychosis. In addition to usual decadron premedication for taxol (20 mg 12 hrs and 6 hrs prior, 20 mg with IV premeds at time of treatment) she had 2 doses of decadron for aches on day 3 and day 4. After the problems began, family reported that patient has had some suspected mental health/ behavioral health problems, which family has managed without medical intervention previously. Patient is now on haldol tid by PCP Dr Frederica Kuster, which family feels is helpful. She additionally had severe taxol aches at dose of 175 mg/m2 and was neutropenic by day 10, neupogen added then. (Note family refused to take her to ED for evaluation including behavioral health assessment when those issues were extreme following the chemotherapy).  She has seen Dr Roselind Messier in consultation; he would like to reevaluate her towards completion of chemotherapy.  She does not have PAC.   ONCOLOGIC HISTORY  Patient had been in usual good health when she developed several weeks of vaginal spotting in early 2014, without other symptoms. She had endometrial biopsy 03-02-2012 [phone]) with grade 1 endometroid adenocarcinoma. She was referred to Dr Yolande Jolly, his exam not remarkable otherwise. She had colonoscopy by Dr Leone Payor on 04-23-13, with a diminutive adenoma and otherwise benign. She had CXR preoperatively 04-20-2013 with chronic bronchitic and interstitial changes and  degenerative changes of spine, otherwise not remarkable; I do not believe that she had other imaging of abdomen/ pelvis. She was taken to total robotic hysterectomy with BSO and bilateral pelvic node evaluation by Dr Duard Brady on 04-24-2013. Post operative course was uncomplicated and she was discharged home on POD 1. Pathology [phone]) found high grade papillary serous carcinoma tumor size 3.5 cm with myometrial invasion 0.5 cm where myometrium was 1.5 cm, with focal angiolymphatic invasion, 6 right pelvic and 1 left pelvic node negative, for T1aN0 staging. She saw Dr Yolande Jolly in follow up on 05-09-13, with recommendation for 6 cycles of adjuvant taxol/ carboplatin followed by vaginal vault brachytherapy. Cycle 1 taxol carbo was 05-30-13, complicated by apparent steroid psychosis, severe aching and neutropenia.   Patient and family do not offer any complaints today, tho patient accepts WC after exam due to knees painful walking length of hall. Patient tells me that she does not remember any problems after the first chemo; family seems very cautious about conversation in this regard. Patient does not seem aware that she is on haldol (?). No nausea. No aching now. Bowels are moving. Appetite good today. No bleeding. No fever or symptoms of infection. Remainder of 10 point Review of Systems negative.  Note patient is staying alone at her home now, with family checking on her.  Objective:  Vital signs in last 24 hours:  BP 152/89  Pulse 102  Temp(Src) 98.2 F (36.8 C) (Oral)  Resp 20  Ht 5\' 6"  (1.676 m)  Wt 202 lb (91.627 kg)  BMI 32.62 kg/m2  Weight is down 3 lbs. Very slowly ambulatory "she is moving well" per granddaughter, able to get on and off exam table with some assistance. Very flat affect as she  has had since I met her, good eye contact, minimal conversation, cooperative with exam. Head shaved but no alopecia yet.  HEENT:PERRLA, sclera clear, anicteric, oropharynx clear, no lesions  and neck supple with midline trachea.  LymphaticsCervical, supraclavicular, and axillary nodes normal. Resp: clear to auscultation bilaterally and normal percussion bilaterally Cardio: regular rate and rhythm GI: soft, non-tender; bowel sounds normal; no masses,  no organomegaly Extremities: extremities normal, atraumatic, no cyanosis or edema Neuro:no sensory deficits noted, otherwise as above. Skin without rash or ecchymosis   Lab Results:  Results for orders placed in visit on 06/13/13  CBC WITH DIFFERENTIAL      Result Value Range   WBC 5.8  3.9 - 10.3 10e3/uL   NEUT# 3.2  1.5 - 6.5 10e3/uL   HGB 13.5  11.6 - 15.9 g/dL   HCT 16.1  09.6 - 04.5 %   Platelets 172  145 - 400 10e3/uL   MCV 88.5  79.5 - 101.0 fL   MCH 30.4  25.1 - 34.0 pg   MCHC 34.4  31.5 - 36.0 g/dL   RBC 4.09  8.11 - 9.14 10e6/uL   RDW 13.2  11.2 - 14.5 %   lymph# 1.5  0.9 - 3.3 10e3/uL   MONO# 1.0 (*) 0.1 - 0.9 10e3/uL   Eosinophils Absolute 0.1  0.0 - 0.5 10e3/uL   Basophils Absolute 0.1  0.0 - 0.1 10e3/uL   NEUT% 55.1  38.4 - 76.8 %   LYMPH% 25.1  14.0 - 49.7 %   MONO% 17.1 (*) 0.0 - 14.0 %   EOS% 1.6  0.0 - 7.0 %   BASO% 1.1  0.0 - 2.0 %  COMPREHENSIVE METABOLIC PANEL (CC13)      Result Value Range   Sodium 138  136 - 145 mEq/L   Potassium 4.2  3.5 - 5.1 mEq/L   Chloride 105  98 - 109 mEq/L   CO2 27  22 - 29 mEq/L   Glucose 124  70 - 140 mg/dl   BUN 78.2  7.0 - 95.6 mg/dL   Creatinine 0.9  0.6 - 1.1 mg/dL   Total Bilirubin 2.13  0.20 - 1.20 mg/dL   Alkaline Phosphatase 71  40 - 150 U/L   AST 23  5 - 34 U/L   ALT 27  0 - 55 U/L   Total Protein 6.9  6.4 - 8.3 g/dL   Albumin 3.5  3.5 - 5.0 g/dL   Calcium 9.3  8.4 - 08.6 mg/dL     Studies/Results:  No results found.  Medications: I have reviewed the patient's current medications.   With patient present, family and I have discussed options for treatment now and possibility of referral for behavioral health evaluation. I have offered  chemotherapy with decrease in decadron premeds and decrease in taxol dose (to 135 mg/m2), or omitting taxol (and steroids) entirely from this second cycle. The daughter is most comfortable with carboplatin only for cycle 2, realizing that this is not perhaps best treatment for the cancer but at same time will keep some treatment going as we also pursue behavioral health assistance. I have been able to schedule her to see psychologist Dr Enzo Bi with [facility] on 06-19-13, appointment time to allow daughter to accompany. In the meantime she will continue present haldol. Cycle 2 carboplatin only/ no steroids including with premeds at [facility] will be given 06-20-13 as long as ANC >=1.5 and plt >=100k; she will receive neupogen 7-10,11,12 and will see medical  oncology provider on 06-27-13 with lab.   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 Vaginal brachytherapy by Dr Roselind Messier could be given during chemo if tolerated, otherwise after chemo completes.  2.agitation and paranoia reported x several days after treatment including additional steroids for taxol aches.  Per daughter, this behavior may be exacerbation of previous tendencies. Hold steroids, refer to [facility]. Note no psychiatrist with that group, however the staff can refer if needed or will work with PCP. 3.unremarkable colonoscopy and mammograms within past month  4.HTN x years  5.degenerative arthritis knees  6.allergic rhinitis  Patient seems comfortable with plan; daughter and granddaughter are in full agreement.   Ibeth Fahmy P, MD   06/13/2013, 9:22 PM

## 2013-06-13 NOTE — Telephone Encounter (Signed)
gv dtr appt schedule for July. Per 7/2 pof inj appts for 7/14 thru 7/16 moved to 7/10 thru 7/12. tx appt on 7/9 adjusted to 2hrs due to 7/2 pof tx will be [place] only no taxol. Per 3rd pof on 7/2 pt does not need appt to Dr. Roselind Messier as this was already done. appt w/Dr. Noe Gens [facility] already taken care of by [facility] and is on schedule ([facility]) for 7/8 and dtr is aware. Pt given appt for Dr. Stanford Breed 8/5. Per 2nd 7/2 pof appt late sept but dtr states pt had surgery 5/13 and was to have a 6wk f/u. Per Selena Batten in [facility] they will inform pt of next f/u after 8/5. Dtr aware.

## 2013-06-13 NOTE — Patient Instructions (Signed)
Dr Enzo Bi  Tuesday July 8 at 4PM (arrive a few minutes early for paperwork)  at [facility] [address]   [phone] to [facility]  We will give just carboplatin for chemo treatment on July 9, then the shots to help white blood cells on July 09-23-11.

## 2013-06-19 ENCOUNTER — Other Ambulatory Visit: Payer: Self-pay

## 2013-06-19 ENCOUNTER — Ambulatory Visit (INDEPENDENT_AMBULATORY_CARE_PROVIDER_SITE_OTHER): Payer: 59 | Admitting: Psychiatry

## 2013-06-19 DIAGNOSIS — C541 Malignant neoplasm of endometrium: Secondary | ICD-10-CM

## 2013-06-19 DIAGNOSIS — F0630 Mood disorder due to known physiological condition, unspecified: Secondary | ICD-10-CM

## 2013-06-20 ENCOUNTER — Other Ambulatory Visit (HOSPITAL_BASED_OUTPATIENT_CLINIC_OR_DEPARTMENT_OTHER): Payer: Medicare Other | Admitting: Lab

## 2013-06-20 ENCOUNTER — Ambulatory Visit (HOSPITAL_BASED_OUTPATIENT_CLINIC_OR_DEPARTMENT_OTHER): Payer: Medicare Other

## 2013-06-20 VITALS — BP 123/74 | HR 105 | Temp 98.7°F | Resp 18 | Ht 66.0 in

## 2013-06-20 DIAGNOSIS — C541 Malignant neoplasm of endometrium: Secondary | ICD-10-CM

## 2013-06-20 DIAGNOSIS — Z5111 Encounter for antineoplastic chemotherapy: Secondary | ICD-10-CM

## 2013-06-20 DIAGNOSIS — C549 Malignant neoplasm of corpus uteri, unspecified: Secondary | ICD-10-CM

## 2013-06-20 LAB — CBC WITH DIFFERENTIAL/PLATELET
Basophils Absolute: 0.1 10*3/uL (ref 0.0–0.1)
Eosinophils Absolute: 0.1 10*3/uL (ref 0.0–0.5)
HCT: 37.8 % (ref 34.8–46.6)
HGB: 13.2 g/dL (ref 11.6–15.9)
MCH: 30.3 pg (ref 25.1–34.0)
MCV: 86.7 fL (ref 79.5–101.0)
MONO%: 7.8 % (ref 0.0–14.0)
NEUT#: 3.2 10*3/uL (ref 1.5–6.5)
NEUT%: 58.1 % (ref 38.4–76.8)
RDW: 12.7 % (ref 11.2–14.5)
lymph#: 1.7 10*3/uL (ref 0.9–3.3)

## 2013-06-20 MED ORDER — SODIUM CHLORIDE 0.9 % IV SOLN
Freq: Once | INTRAVENOUS | Status: AC
  Administered 2013-06-20: 11:00:00 via INTRAVENOUS

## 2013-06-20 MED ORDER — ONDANSETRON 16 MG/50ML IVPB (CHCC)
16.0000 mg | Freq: Once | INTRAVENOUS | Status: AC
  Administered 2013-06-20: 16 mg via INTRAVENOUS

## 2013-06-20 MED ORDER — SODIUM CHLORIDE 0.9 % IV SOLN
518.5000 mg | Freq: Once | INTRAVENOUS | Status: AC
  Administered 2013-06-20: 520 mg via INTRAVENOUS
  Filled 2013-06-20: qty 52

## 2013-06-20 MED ORDER — LORAZEPAM 1 MG PO TABS
1.0000 mg | ORAL_TABLET | Freq: Once | ORAL | Status: AC | PRN
  Administered 2013-06-20: 1 mg via ORAL

## 2013-06-20 NOTE — Patient Instructions (Addendum)
Cancer Center Discharge Instructions for Patients Receiving Chemotherapy  Today you received the following chemotherapy agents Carboplatin To help prevent nausea and vomiting after your treatment, we encourage you to take your nausea medication as prescribed.  If you develop nausea and vomiting that is not controlled by your nausea medication, call the clinic.   BELOW ARE SYMPTOMS THAT SHOULD BE REPORTED IMMEDIATELY:  *FEVER GREATER THAN 100.5 F  *CHILLS WITH OR WITHOUT FEVER  NAUSEA AND VOMITING THAT IS NOT CONTROLLED WITH YOUR NAUSEA MEDICATION  *UNUSUAL SHORTNESS OF BREATH  *UNUSUAL BRUISING OR BLEEDING  TENDERNESS IN MOUTH AND THROAT WITH OR WITHOUT PRESENCE OF ULCERS  *URINARY PROBLEMS  *BOWEL PROBLEMS  UNUSUAL RASH Items with * indicate a potential emergency and should be followed up as soon as possible.  Feel free to call the clinic you have any questions or concerns. The clinic phone number is [phone].    

## 2013-06-21 ENCOUNTER — Ambulatory Visit (HOSPITAL_BASED_OUTPATIENT_CLINIC_OR_DEPARTMENT_OTHER): Payer: Medicare Other

## 2013-06-21 ENCOUNTER — Telehealth: Payer: Self-pay | Admitting: Internal Medicine

## 2013-06-21 VITALS — BP 126/67 | HR 128 | Temp 100.1°F

## 2013-06-21 DIAGNOSIS — Z5189 Encounter for other specified aftercare: Secondary | ICD-10-CM

## 2013-06-21 DIAGNOSIS — C541 Malignant neoplasm of endometrium: Secondary | ICD-10-CM

## 2013-06-21 DIAGNOSIS — C549 Malignant neoplasm of corpus uteri, unspecified: Secondary | ICD-10-CM

## 2013-06-21 MED ORDER — FILGRASTIM 480 MCG/0.8ML IJ SOLN
480.0000 ug | Freq: Once | INTRAMUSCULAR | Status: AC
  Administered 2013-06-21: 480 ug via SUBCUTANEOUS
  Filled 2013-06-21: qty 0.8

## 2013-06-21 NOTE — Telephone Encounter (Signed)
Pt needs refill of haloperidol (HALDOL) 1 MG tablet  1/ BID (pls note daily amount change to BID) Pharm: [facility]  (Pharm closes at 6pm and pt is out of meds)

## 2013-06-21 NOTE — Progress Notes (Signed)
Pegge here for Neupogen injection.  Temp 100.1  No complaints of cough or congestion, or urinary symptoms (burning, strong odor or frequency).   Has not been drinking much--approximately only 20 ounces today at 3:30 pm.   Spoke with Sallye Ober, RN to discuss this patient.  Recheck of temp is 100.3.  Talked with family member and patient about Neutropenic precautions, good hand washing, staying away from people who are sick and out of crowds.  Gave patient some masks to use.  Told her to check her temperature as needed and if she were to have any chills.   We will check her again when she comes in for her injection, tomorrow.

## 2013-06-22 ENCOUNTER — Ambulatory Visit: Payer: Medicare Other

## 2013-06-22 ENCOUNTER — Inpatient Hospital Stay (HOSPITAL_COMMUNITY): Payer: Medicare Other

## 2013-06-22 ENCOUNTER — Observation Stay (HOSPITAL_COMMUNITY)
Admission: EM | Admit: 2013-06-22 | Discharge: 2013-06-23 | Disposition: A | Discharge status: Home / Self Care | Payer: Medicare Other | Attending: Internal Medicine | Admitting: Internal Medicine

## 2013-06-22 ENCOUNTER — Telehealth: Payer: Self-pay | Admitting: Hematology and Oncology

## 2013-06-22 ENCOUNTER — Encounter (HOSPITAL_COMMUNITY): Payer: Self-pay

## 2013-06-22 DIAGNOSIS — C549 Malignant neoplasm of corpus uteri, unspecified: Secondary | ICD-10-CM | POA: Insufficient documentation

## 2013-06-22 DIAGNOSIS — J309 Allergic rhinitis, unspecified: Secondary | ICD-10-CM

## 2013-06-22 DIAGNOSIS — R634 Abnormal weight loss: Secondary | ICD-10-CM | POA: Insufficient documentation

## 2013-06-22 DIAGNOSIS — E871 Hypo-osmolality and hyponatremia: Secondary | ICD-10-CM

## 2013-06-22 DIAGNOSIS — E860 Dehydration: Secondary | ICD-10-CM

## 2013-06-22 DIAGNOSIS — D72829 Elevated white blood cell count, unspecified: Secondary | ICD-10-CM

## 2013-06-22 DIAGNOSIS — N179 Acute kidney failure, unspecified: Secondary | ICD-10-CM | POA: Diagnosis present

## 2013-06-22 DIAGNOSIS — C541 Malignant neoplasm of endometrium: Secondary | ICD-10-CM | POA: Diagnosis present

## 2013-06-22 DIAGNOSIS — N289 Disorder of kidney and ureter, unspecified: Secondary | ICD-10-CM

## 2013-06-22 DIAGNOSIS — R5381 Other malaise: Secondary | ICD-10-CM | POA: Insufficient documentation

## 2013-06-22 DIAGNOSIS — Z79899 Other long term (current) drug therapy: Secondary | ICD-10-CM | POA: Insufficient documentation

## 2013-06-22 DIAGNOSIS — E785 Hyperlipidemia, unspecified: Secondary | ICD-10-CM | POA: Insufficient documentation

## 2013-06-22 DIAGNOSIS — I10 Essential (primary) hypertension: Secondary | ICD-10-CM | POA: Diagnosis present

## 2013-06-22 DIAGNOSIS — K219 Gastro-esophageal reflux disease without esophagitis: Secondary | ICD-10-CM | POA: Insufficient documentation

## 2013-06-22 DIAGNOSIS — R509 Fever, unspecified: Secondary | ICD-10-CM | POA: Insufficient documentation

## 2013-06-22 DIAGNOSIS — D649 Anemia, unspecified: Secondary | ICD-10-CM | POA: Insufficient documentation

## 2013-06-22 DIAGNOSIS — Z90710 Acquired absence of both cervix and uterus: Secondary | ICD-10-CM | POA: Insufficient documentation

## 2013-06-22 LAB — BASIC METABOLIC PANEL
CO2: 22 mEq/L (ref 19–32)
Calcium: 8.6 mg/dL (ref 8.4–10.5)
GFR calc non Af Amer: 32 mL/min — ABNORMAL LOW (ref >90)
Sodium: 127 mEq/L — ABNORMAL LOW (ref 135–145)

## 2013-06-22 LAB — CBC WITH DIFFERENTIAL/PLATELET
Basophils Absolute: 0 10*3/uL (ref 0.0–0.1)
Eosinophils Absolute: 0.3 10*3/uL (ref 0.0–0.7)
Eosinophils Relative: 2 % (ref 0–5)
Lymphocytes Relative: 2 % — ABNORMAL LOW (ref 12–46)
MCV: 86.5 fL (ref 78.0–100.0)
Neutrophils Relative %: 93 % — ABNORMAL HIGH (ref 43–77)
Platelets: 226 10*3/uL (ref 150–400)
RDW: 13 % (ref 11.5–15.5)
WBC: 19.7 10*3/uL — ABNORMAL HIGH (ref 4.0–10.5)

## 2013-06-22 LAB — URINALYSIS, ROUTINE W REFLEX MICROSCOPIC
Nitrite: NEGATIVE
Specific Gravity, Urine: 1.014 (ref 1.005–1.030)
Urobilinogen, UA: 0.2 mg/dL (ref 0.0–1.0)

## 2013-06-22 LAB — URINE MICROSCOPIC-ADD ON

## 2013-06-22 MED ORDER — ASPIRIN EC 81 MG PO TBEC
81.0000 mg | DELAYED_RELEASE_TABLET | Freq: Every day | ORAL | Status: DC
  Administered 2013-06-22 – 2013-06-23 (×2): 81 mg via ORAL
  Filled 2013-06-22 (×2): qty 1

## 2013-06-22 MED ORDER — SODIUM CHLORIDE 0.9 % IV SOLN: 1000.0000 mL | INTRAVENOUS | Status: DC

## 2013-06-22 MED ORDER — SODIUM CHLORIDE 0.9 % IV SOLN
1000.0000 mL | Freq: Once | INTRAVENOUS | Status: AC
  Administered 2013-06-22: 1000 mL via INTRAVENOUS

## 2013-06-22 MED ORDER — LORAZEPAM 0.5 MG PO TABS: 0.5000 mg | ORAL_TABLET | Freq: Four times a day (QID) | ORAL | Status: DC | PRN

## 2013-06-22 MED ORDER — SODIUM CHLORIDE 0.9 % IV SOLN
INTRAVENOUS | Status: DC
  Administered 2013-06-22 (×2): via INTRAVENOUS

## 2013-06-22 MED ORDER — HALOPERIDOL 1 MG PO TABS: 1.0000 mg | ORAL_TABLET | Freq: Two times a day (BID) | ORAL | Status: DC | PRN

## 2013-06-22 MED ORDER — ACETAMINOPHEN 325 MG PO TABS: 650.0000 mg | ORAL_TABLET | Freq: Four times a day (QID) | ORAL | Status: DC | PRN

## 2013-06-22 MED ORDER — HALOPERIDOL 1 MG PO TABS
1.0000 mg | ORAL_TABLET | Freq: Three times a day (TID) | ORAL | Status: DC | PRN
  Administered 2013-06-22: 1 mg via ORAL
  Filled 2013-06-22: qty 1

## 2013-06-22 MED ORDER — ACETAMINOPHEN 650 MG RE SUPP: 650.0000 mg | Freq: Four times a day (QID) | RECTAL | Status: DC | PRN

## 2013-06-22 MED ORDER — ENOXAPARIN SODIUM 40 MG/0.4ML ~~LOC~~ SOLN
40.0000 mg | SUBCUTANEOUS | Status: DC
  Administered 2013-06-22 – 2013-06-23 (×2): 40 mg via SUBCUTANEOUS
  Filled 2013-06-22 (×2): qty 0.4

## 2013-06-22 MED ORDER — ONDANSETRON HCL 4 MG PO TABS: 4.0000 mg | ORAL_TABLET | Freq: Four times a day (QID) | ORAL | Status: DC | PRN

## 2013-06-22 MED ORDER — ENSURE COMPLETE PO LIQD
237.0000 mL | Freq: Two times a day (BID) | ORAL | Status: DC
  Administered 2013-06-22 – 2013-06-23 (×2): 237 mL via ORAL

## 2013-06-22 MED ORDER — HYDROCORTISONE ACE-PRAMOXINE 1-1 % RE FOAM
1.0000 | Freq: Two times a day (BID) | RECTAL | Status: DC
  Administered 2013-06-22: 1 via RECTAL
  Filled 2013-06-22: qty 10

## 2013-06-22 MED ORDER — ONDANSETRON HCL 4 MG PO TABS: 8.0000 mg | ORAL_TABLET | Freq: Two times a day (BID) | ORAL | Status: DC | PRN

## 2013-06-22 MED ORDER — ONDANSETRON HCL 4 MG/2ML IJ SOLN: 4.0000 mg | Freq: Four times a day (QID) | INTRAMUSCULAR | Status: DC | PRN

## 2013-06-22 MED ORDER — FLUTICASONE PROPIONATE 50 MCG/ACT NA SUSP
1.0000 | Freq: Every day | NASAL | Status: DC
  Administered 2013-06-22: 1 via NASAL
  Filled 2013-06-22: qty 16

## 2013-06-22 MED ORDER — AMLODIPINE BESYLATE 5 MG PO TABS
5.0000 mg | ORAL_TABLET | Freq: Every evening | ORAL | Status: DC
  Administered 2013-06-22: 5 mg via ORAL
  Filled 2013-06-22 (×2): qty 1

## 2013-06-22 MED ORDER — HYDROCODONE-ACETAMINOPHEN 10-325 MG PO TABS: 1.0000 | ORAL_TABLET | Freq: Four times a day (QID) | ORAL | Status: DC | PRN

## 2013-06-22 MED ORDER — HYDRALAZINE HCL 20 MG/ML IJ SOLN: 10.0000 mg | INTRAMUSCULAR | Status: DC | PRN

## 2013-06-22 NOTE — ED Notes (Signed)
Waiting for chest x-ray at this time

## 2013-06-22 NOTE — Progress Notes (Signed)
INITIAL NUTRITION ASSESSMENT  DOCUMENTATION CODES Per approved criteria  -Obesity Unspecified   INTERVENTION: - Ensure Complete BID - Encouraged pt to continue to eat well during admission - Will continue to monitor   NUTRITION DIAGNOSIS: Increased nutrient needs related to endometrial CA on chemotherapy as evidenced by MD notes.   Goal: Pt to consume >90% of meals/supplements  Monitor:  Weights, labs, intake  Reason for Assessment: Nutrition risk   79 y.o. female  Admitting Dx: ARF (acute renal failure)  ASSESSMENT: Pt with endometrial CA on chemotherapy, admitted with difficulty urinating. Met with pt who reports eating "whenever she wanted to" at home PTA. Pt reports some unintended weight loss of a few pounds recently. Said her daughter is a Charity fundraiser and has been encouraging her to get more protein into her diet. Interested in getting Ensure during admission, will order. Ate 70% of lunch tray. Noted pt with low sodium, elevated BUN/Cr, low GFR - pt with acute renal failure.   Height: Ht Readings from Last 1 Encounters:  06/22/13 5\' 6"  (1.676 m)    Weight: Wt Readings from Last 1 Encounters:  06/22/13 202 lb 9.6 oz (91.9 kg)    Ideal Body Weight: 130 lb  % Ideal Body Weight: 155%  Wt Readings from Last 10 Encounters:  06/22/13 202 lb 9.6 oz (91.9 kg)  06/13/13 202 lb (91.627 kg)  06/06/13 205 lb 4.8 oz (93.123 kg)  06/06/13 205 lb 8 oz (93.214 kg)  05/23/13 209 lb 12.8 oz (95.165 kg)  05/09/13 205 lb 6.4 oz (93.169 kg)  04/24/13 212 lb (96.163 kg)  04/24/13 212 lb (96.163 kg)  04/20/13 212 lb (96.163 kg)  04/17/13 212 lb (96.163 kg)    Usual Body Weight: 212 lb in May 2014  % Usual Body Weight: 95%  BMI:  Body mass index is 32.72 kg/(m^2). Class I obesity  Estimated Nutritional Needs: Kcal: 1500-1650 Protein: 60-70g Fluid: 1.5-1.6L/day  Skin: Intact   Diet Order: Cardiac  EDUCATION NEEDS: -No education needs identified at this  time   Intake/Output Summary (Last 24 hours) at 06/22/13 1526 Last data filed at 06/22/13 1458  Gross per 24 hour  Intake    240 ml  Output   1550 ml  Net  -1310 ml    Last BM: 7/10  Labs:   Recent Labs Lab 06/22/13 0250  NA 127*  K 3.5  CL 94*  CO2 22  BUN 27*  CREATININE 1.51*  CALCIUM 8.6  GLUCOSE 126*    CBG (last 3)  No results found for this basename: GLUCAP,  in the last 72 hours  Scheduled Meds: . amLODipine  5 mg Oral QPM  . aspirin EC  81 mg Oral Daily  . enoxaparin (LOVENOX) injection  40 mg Subcutaneous Q24H  . fluticasone  1 spray Each Nare Daily  . hydrocortisone-pramoxine  1 applicator Rectal BID    Continuous Infusions: . sodium chloride 100 mL/hr at 06/22/13 0600    Past Medical History  Diagnosis Date  . ALLERGIC RHINITIS 10/03/2007  . HYPERLIPIDEMIA 10/03/2007  . HYPERTENSION 10/03/2007  . Ulcer     gastric  . GERD (gastroesophageal reflux disease)     "ONCE IN A WHILE"  TUMS IF NEEDED  . Arthritis     KNEES  . Endometrial cancer     new diagnosis 03/02/13    Past Surgical History  Procedure Laterality Date  . Cardiac catheterization    . Tubal ligation    . Lithotripsy  for kidney stone  . Partial thyroidectomy for a growth      YRS AGO   . Robotic assisted total hysterectomy with bilateral salpingo oopherectomy Bilateral 04/24/2013    Procedure: ROBOTIC ASSISTED TOTAL HYSTERECTOMY WITH BILATERAL SALPINGO OOPHORECTOMY,  LYMPH NODE DISSECTION;  Surgeon: Rejeana Brock A. Duard Brady, MD;  Location: [facility];  Service: Gynecology;  Laterality: Bilateral;  . Lymph node dissection N/A 04/24/2013    Procedure: LYMPH NODE DISSECTION;  Surgeon: Rejeana Brock A. Duard Brady, MD;  Location: [facility];  Service: Gynecology;  Laterality: N/A;  . Abdominal hysterectomy       Levon Hedger MS, RD, LDN [phone] Pager [phone] After Hours Pager

## 2013-06-22 NOTE — Telephone Encounter (Signed)
Moved 7/16 lb/CP to 7/17 due to call day. lmonvm for pt re changed w/new d/t. Schedule mailed.

## 2013-06-22 NOTE — ED Notes (Signed)
Pt had chemo yesterday, low grade fever and not urinating this evening

## 2013-06-22 NOTE — H&P (Signed)
[facility] History and Physical  Misty Alvarado RUE:[id number] DOB: 11-28-1933 DOA: 06/22/2013  Referring physician: ER physician. PCP: Rogelia Boga, MD  Specialists: Dr. Darrold Span. Oncologist.  Chief Complaint: Difficulty urinating.  HPI: Misty Alvarado is a 79 y.o. female with history of endometrial carcinoma on chemotherapy and presently receiving Neupogen post chemotherapy has difficulty urinating yesterday. Since the symptoms persisted she came to the ER. In the ER bladder scan showed she had around 160 cc of urine in the bladder and patient was able to void herself. Labs show increased creatinine from baseline with mild hyponatremia. In addition patient also has leukocytosis as patient has been receiving Neupogen. Patient's temperature was around 100.78F. Patient denies any nausea vomiting abdominal pain diarrhea chest pain shortness of breath. At this time patient has been admitted for dehydration and close followup of her fever trends.  Review of Systems: As presented in the history of presenting illness, rest negative.  Past Medical History  Diagnosis Date  . ALLERGIC RHINITIS 10/03/2007  . HYPERLIPIDEMIA 10/03/2007  . HYPERTENSION 10/03/2007  . Ulcer     gastric  . GERD (gastroesophageal reflux disease)     "ONCE IN A WHILE"  TUMS IF NEEDED  . Arthritis     KNEES  . Endometrial cancer     new diagnosis 03/02/13   Past Surgical History  Procedure Laterality Date  . Cardiac catheterization    . Tubal ligation    . Lithotripsy      for kidney stone  . Partial thyroidectomy for a growth      YRS AGO   . Robotic assisted total hysterectomy with bilateral salpingo oopherectomy Bilateral 04/24/2013    Procedure: ROBOTIC ASSISTED TOTAL HYSTERECTOMY WITH BILATERAL SALPINGO OOPHORECTOMY,  LYMPH NODE DISSECTION;  Surgeon: Rejeana Brock A. Duard Brady, MD;  Location: [facility];  Service: Gynecology;  Laterality: Bilateral;  . Lymph node dissection N/A 04/24/2013    Procedure:  LYMPH NODE DISSECTION;  Surgeon: Rejeana Brock A. Duard Brady, MD;  Location: [facility];  Service: Gynecology;  Laterality: N/A;  . Abdominal hysterectomy     Social History:  reports that she has never smoked. She has never used smokeless tobacco. She reports that she does not drink alcohol or use illicit drugs. Home. where does patient live-- Can do ADLs. Can patient participate in ADLs?  Allergies  Allergen Reactions  . Dexamethasone Other (See Comments)    Mood disturbance, psychosis    Family History  Problem Relation Age of Onset  . Arthritis Mother   . Colon cancer Neg Hx   . Cancer Sister     1 deceased from lung cancer  . Cancer Brother     2 deceased from lung cancer      Prior to Admission medications   Medication Sig Start Date End Date Taking? Authorizing Provider  amLODipine (NORVASC) 5 MG tablet Take 5 mg by mouth every evening.   Yes Historical Provider, MD  aspirin 81 MG tablet Take 81 mg by mouth daily.    Yes Historical Provider, MD  haloperidol (HALDOL) 1 MG tablet Take 1 tablet (1 mg total) by mouth 3 (three) times daily as needed. 06/05/13  Yes Gordy Savers, MD  HYDROcodone-acetaminophen [facility]) 10-325 MG per tablet Take 1 tablet by mouth every 6 (six) hours as needed for pain.   Yes Historical Provider, MD  hydrocortisone-pramoxine [facility] rectal foam Place 1 applicator rectally 2 (two) times daily. 05/03/13  Yes Antionette Char, MD  LORazepam (ATIVAN) 0.5 MG tablet Take 1-2 tabs  under the tongue or swallow  Every 6 hrs. As needed for nausea.  Will make you drowsy 05/23/13  Yes Lennis Buzzy Han, MD  losartan-hydrochlorothiazide (HYZAAR) 100-12.5 MG per tablet Take 1 tablet by mouth every morning.   Yes Historical Provider, MD  mometasone (NASONEX) 50 MCG/ACT nasal spray Place 2 sprays into the nose daily as needed (for allergies).   Yes Historical Provider, MD  ondansetron (ZOFRAN) 8 MG tablet Take 1-2 tablets (8-16 mg total) by mouth every 12 (twelve) hours as  needed for nausea (Will not make you drowsy). 05/23/13  Yes Lennis Buzzy Han, MD   Physical Exam: Filed Vitals:   06/22/13 0032 06/22/13 0357  BP: 123/70 132/62  Pulse: 116 106  Temp: 99.6 F (37.6 C) 99.2 F (37.3 C)  TempSrc: Oral Oral  Resp: 20 17  SpO2: 97% 98%     General:  Well-developed and nourished.  Eyes: Anicteric no pallor.  ENT: No discharge from ears eyes nose mouth.  Neck: No mass felt.  Cardiovascular: S1-S2 heard.  Respiratory: No rhonchi or crepitations.  Abdomen: Soft nontender bowel sounds present.  Skin: No rash.  Musculoskeletal: No edema.  Psychiatric: Appears normal.  Neurologic: Alert awake oriented to time place and person. Moves all extremities.  Labs on Admission:  Basic Metabolic Panel:  Recent Labs Lab 06/22/13 0250  NA 127*  K 3.5  CL 94*  CO2 22  GLUCOSE 126*  BUN 27*  CREATININE 1.51*  CALCIUM 8.6   Liver Function Tests: No results found for this basename: AST, ALT, ALKPHOS, BILITOT, PROT, ALBUMIN,  in the last 168 hours No results found for this basename: LIPASE, AMYLASE,  in the last 168 hours No results found for this basename: AMMONIA,  in the last 168 hours CBC:  Recent Labs Lab 06/20/13 1033 06/22/13 0250  WBC 5.5 19.7*  NEUTROABS 3.2 18.3*  HGB 13.2 11.7*  HCT 37.8 33.4*  MCV 86.7 86.5  PLT 292 226   Cardiac Enzymes: No results found for this basename: CKTOTAL, CKMB, CKMBINDEX, TROPONINI,  in the last 168 hours  BNP (last 3 results) No results found for this basename: PROBNP,  in the last 8760 hours CBG: No results found for this basename: GLUCAP,  in the last 168 hours  Radiological Exams on Admission: No results found.   Assessment/Plan Principal Problem:   ARF (acute renal failure) Active Problems:   HYPERTENSION   Endometrial cancer   1. Acute renal failure probably secondary to dehydration - at this time we will hold patient's lisinopril and HCTZ. Gently hydrate and closely follow  metabolic panel and intake output. 2. Mild fever - blood cultures and urine cultures have been ordered. Closely follow fever trends and if it tends to increase more than 100.4 may consider antibiotic. 3. Mild hyponatremia - probably secondary to dehydration and HCTZ. Closely follow metabolic panel. 4. Hypertension - lisinopril and HCTZ is on hold due to renal failure. Continue amlodipine and patient is placed on when necessary IV hydralazine for systolic blood pressure more than 160. 5. Endometrial carcinoma - per oncologist. 6. Leukocytosis - probably secondary to Neupogen. Follow cultures. Patient does not look septic. 7. Mild anemia - probably related to chemotherapy. Follow CBC.    Code Status: Full code.  Family Communication: Patient's daughter at the bedside.  Disposition Plan: Admit to inpatient.    Janasha Barkalow N. [facility] Pager [phone].  If 7PM-7AM, please contact night-coverage [email or web address] Password [facility] 06/22/2013, 4:54 AM

## 2013-06-22 NOTE — ED Provider Notes (Signed)
History    CSN: [id number] Arrival date & time 06/22/13  0027  First MD Initiated Contact with Patient 06/22/13 0200     Chief Complaint  Patient presents with  . Fever  . Urinary Retention   (Consider location/radiation/quality/duration/timing/severity/associated sxs/prior Treatment) HPI 79 yo female presents to the ER with complaint of generalized fatigue, weakness, low grade fever 100.2 and below, decreased PO intake, and no urination today.  Pt had chemotherapy for endometrial cancer on Wednesday, received neupogen shot today, due to have second tomorrow.  No cough, no n/v/d, no abd pain, no urinary symptoms.  Pt feels the vague urge to go, but has not urinated since yesterday morning.    Past Medical History  Diagnosis Date  . ALLERGIC RHINITIS 10/03/2007  . HYPERLIPIDEMIA 10/03/2007  . HYPERTENSION 10/03/2007  . Ulcer     gastric  . GERD (gastroesophageal reflux disease)     "ONCE IN A WHILE"  TUMS IF NEEDED  . Arthritis     KNEES  . Endometrial cancer     new diagnosis 03/02/13   Past Surgical History  Procedure Laterality Date  . Cardiac catheterization    . Tubal ligation    . Lithotripsy      for kidney stone  . Partial thyroidectomy for a growth      YRS AGO   . Robotic assisted total hysterectomy with bilateral salpingo oopherectomy Bilateral 04/24/2013    Procedure: ROBOTIC ASSISTED TOTAL HYSTERECTOMY WITH BILATERAL SALPINGO OOPHORECTOMY,  LYMPH NODE DISSECTION;  Surgeon: Rejeana Brock A. Duard Brady, MD;  Location: [facility];  Service: Gynecology;  Laterality: Bilateral;  . Lymph node dissection N/A 04/24/2013    Procedure: LYMPH NODE DISSECTION;  Surgeon: Rejeana Brock A. Duard Brady, MD;  Location: [facility];  Service: Gynecology;  Laterality: N/A;  . Abdominal hysterectomy     Family History  Problem Relation Age of Onset  . Arthritis Mother   . Colon cancer Neg Hx   . Cancer Sister     1 deceased from lung cancer  . Cancer Brother     2 deceased from lung cancer   History   Substance Use Topics  . Smoking status: Never Smoker   . Smokeless tobacco: Never Used  . Alcohol Use: No   OB History   Grav Para Term Preterm Abortions TAB SAB Ect Mult Living                 Review of Systems  Constitutional: Negative.   HENT: Negative.   Eyes: Negative.   Respiratory: Negative.   Cardiovascular: Negative.   Gastrointestinal: Negative.   Genitourinary: Negative.   Musculoskeletal: Negative.   Skin: Negative.   Neurological: Negative.   Psychiatric/Behavioral: Negative.   All other systems reviewed and are negative.    Allergies  Dexamethasone  Home Medications   Current Outpatient Rx  Name  Route  Sig  Dispense  Refill  . amLODipine (NORVASC) 5 MG tablet   Oral   Take 5 mg by mouth every evening.         Marland Kitchen aspirin 81 MG tablet   Oral   Take 81 mg by mouth daily.          . haloperidol (HALDOL) 1 MG tablet   Oral   Take 1 tablet (1 mg total) by mouth 3 (three) times daily as needed.   30 tablet   0   . HYDROcodone-acetaminophen (NORCO) 10-325 MG per tablet   Oral   Take 1 tablet by mouth every  6 (six) hours as needed for pain.         . hydrocortisone-pramoxine (PROCTOFOAM-HC) rectal foam   Rectal   Place 1 applicator rectally 2 (two) times daily.   10 g   2   . LORazepam (ATIVAN) 0.5 MG tablet      Take 1-2 tabs  under the tongue or swallow  Every 6 hrs. As needed for nausea.  Will make you drowsy   20 tablet   0   . losartan-hydrochlorothiazide (HYZAAR) 100-12.5 MG per tablet   Oral   Take 1 tablet by mouth every morning.         . mometasone (NASONEX) 50 MCG/ACT nasal spray   Nasal   Place 2 sprays into the nose daily as needed (for allergies).         . ondansetron (ZOFRAN) 8 MG tablet   Oral   Take 1-2 tablets (8-16 mg total) by mouth every 12 (twelve) hours as needed for nausea (Will not make you drowsy).   20 tablet   2    BP 123/70  Pulse 116  Temp(Src) 99.6 F (37.6 C) (Oral)  Resp 20  SpO2  97% Physical Exam  Nursing note and vitals reviewed. Constitutional: She is oriented to person, place, and time. She appears well-developed and well-nourished.  HENT:  Head: Normocephalic and atraumatic.  Nose: Nose normal.  Dry mucous membranes  Eyes: Conjunctivae and EOM are normal. Pupils are equal, round, and reactive to light.  Neck: Normal range of motion. Neck supple. No JVD present. No tracheal deviation present. No thyromegaly present.  Cardiovascular: Normal rate, regular rhythm, normal heart sounds and intact distal pulses.  Exam reveals no gallop and no friction rub.   No murmur heard. Pulmonary/Chest: Effort normal and breath sounds normal. No stridor. No respiratory distress. She has no wheezes. She has no rales. She exhibits no tenderness.  Abdominal: Soft. Bowel sounds are normal. She exhibits no distension and no mass. There is no tenderness. There is no rebound and no guarding.  Musculoskeletal: Normal range of motion. She exhibits no edema and no tenderness.  Lymphadenopathy:    She has no cervical adenopathy.  Neurological: She is alert and oriented to person, place, and time. She exhibits normal muscle tone. Coordination normal.  Skin: Skin is warm and dry. No rash noted. No erythema. No pallor.  Psychiatric: She has a normal mood and affect. Her behavior is normal. Judgment and thought content normal.    ED Course  Procedures (including critical care time) Labs Reviewed  CBC WITH DIFFERENTIAL - Abnormal; Notable for the following:    WBC 19.7 (*)    RBC 3.86 (*)    Hemoglobin 11.7 (*)    HCT 33.4 (*)    Neutrophils Relative % 93 (*)    Neutro Abs 18.3 (*)    Lymphocytes Relative 2 (*)    Lymphs Abs 0.5 (*)    All other components within normal limits  BASIC METABOLIC PANEL - Abnormal; Notable for the following:    Sodium 127 (*)    Chloride 94 (*)    Glucose, Bld 126 (*)    BUN 27 (*)    Creatinine, Ser 1.51 (*)    GFR calc non Af Amer 32 (*)    GFR  calc Af Amer 37 (*)    All other components within normal limits   No results found. 1. Dehydration   2. Hyponatremia   3. Acute renal insufficiency   4. Leukocytosis  MDM  79 yo female with temps under 100.4, on chemo, recent neupogen with fatigue, no urinary output.  Bladder scan with 170 cc.  Pt appears clinically dehydrated.  Will give IVF, check labs.  3:44 AM Pt with leukocytosis, which seems high even with neupogen given today.  Again, no source of low grade temps, no fever here.  Pt with new hyponatremia and renal insufficiency.  Will d/w hospitalist for admission.  Olivia Mackie, MD 06/22/13 [phone]

## 2013-06-22 NOTE — Progress Notes (Signed)
BP 124/54  Pulse 106  Temp(Src) 98.8 F (37.1 C) (Oral)  Resp 18  Ht 5\' 6"  (1.676 m)  Wt 91.9 kg (202 lb 9.6 oz)  BMI 32.72 kg/m2  SpO2 98% - blood cultures and urine cultures pending. - Monitor fever trends - cont IV fluids check a b-met in am. - Check CBC.

## 2013-06-22 NOTE — Telephone Encounter (Signed)
Spoke to pt's daughter [name] she is in the hospital now but she is out of her Haldol and is only taking it BID. Told [name] will call Haldol into pharmacy as requested. [name] verbalized understanding. Rx called into pharmacy.

## 2013-06-23 ENCOUNTER — Ambulatory Visit: Payer: Medicare Other

## 2013-06-23 DIAGNOSIS — D72829 Elevated white blood cell count, unspecified: Secondary | ICD-10-CM

## 2013-06-23 DIAGNOSIS — N289 Disorder of kidney and ureter, unspecified: Secondary | ICD-10-CM

## 2013-06-23 LAB — BASIC METABOLIC PANEL
BUN: 13 mg/dL (ref 6–23)
CO2: 21 mEq/L (ref 19–32)
Calcium: 8.4 mg/dL (ref 8.4–10.5)
Creatinine, Ser: 0.98 mg/dL (ref 0.50–1.10)
Glucose, Bld: 100 mg/dL — ABNORMAL HIGH (ref 70–99)

## 2013-06-23 LAB — URINE CULTURE: Colony Count: NO GROWTH

## 2013-06-23 LAB — CBC
MCH: 30.6 pg (ref 26.0–34.0)
MCHC: 34.9 g/dL (ref 30.0–36.0)
MCV: 87.8 fL (ref 78.0–100.0)
Platelets: 196 10*3/uL (ref 150–400)
RBC: 3.43 MIL/uL — ABNORMAL LOW (ref 3.87–5.11)

## 2013-06-23 MED ORDER — HALOPERIDOL 1 MG PO TABS: 1.0000 mg | ORAL_TABLET | Freq: Three times a day (TID) | ORAL | Status: DC | PRN

## 2013-06-23 MED ORDER — POLYETHYLENE GLYCOL 3350 17 G PO PACK
17.0000 g | PACK | Freq: Every day | ORAL | Status: DC
Start: 2013-06-23 — End: 2013-06-23
  Administered 2013-06-23: 17 g via ORAL
  Filled 2013-06-23: qty 1

## 2013-06-23 NOTE — Discharge Summary (Addendum)
Physician Discharge Summary  QUINESHA SELINGER ZOX:[id number] DOB: 21-Feb-1933 DOA: 06/22/2013  PCP: Rogelia Boga, MD  Admit date: 06/22/2013 Discharge date: 06/23/2013  Time spent: 35 minutes  Recommendations for Outpatient Follow-up:  1. Follow up with Dr. Dorie Rank, hospital follow up.  Discharge Diagnoses:  Principal Problem:   ARF (acute renal failure) Active Problems:   HYPERTENSION   Endometrial cancer   Discharge Condition: stable  Diet recommendation: regular  Filed Weights   06/22/13 0603  Weight: 91.9 kg (202 lb 9.6 oz)    History of present illness:  79 y.o. female with history of endometrial carcinoma on chemotherapy and presently receiving Neupogen post chemotherapy has difficulty urinating yesterday. Since the symptoms persisted she came to the ER. In the ER bladder scan showed she had around 160 cc of urine in the bladder and patient was able to void herself. Labs show increased creatinine from baseline with mild hyponatremia. In addition patient also has leukocytosis as patient has been receiving Neupogen. Patient's temperature was around 100.12F. Patient denies any nausea vomiting abdominal pain diarrhea chest pain shortness of breath. At this time patient has been admitted for dehydration and close followup of her fever trends.   Hospital Course:  Acute renal failure:  - Probably pre-renal.  - Started empirically on IV fluids,  held patient's lisinopril and HCTZ.  - B-met repeated showed Cr. At baseline. - blood cultures and urine cultures negative till date. - has remained afebrile through out her hospital stay.  Mild hyponatremia: - Probably secondary to dehydration and HCTZ.  - Resolved.  Hypertension: - lisinopril and HCTZ is on hold due to renal failure.  - Continue amlodipine and resume as an outpatient.  Endometrial carcinoma:  - per oncologist.  Leukocytosis:  - probably secondary to Neupogen.   Mild anemia: - probably related to  chemotherapy. Follow CBC.   Procedures:  Blood cultures and U.C. negative till date.  Consultations:  none  Discharge Exam: Filed Vitals:   06/22/13 1330 06/22/13 1742 06/22/13 2155 06/23/13 1455  BP: 125/73 120/70 127/63 122/64  Pulse: 94  98 94  Temp: 98.6 F (37 C)  98 F (36.7 C) 98 F (36.7 C)  TempSrc: Oral  Oral Oral  Resp: 24  18 20   Height:      Weight:      SpO2: 96%  96% 97%    General: A&O x3 Cardiovascular: RRR Respiratory: good air movement CTA B/L  Discharge Instructions      Discharge Orders   Future Appointments Provider Department Dept Phone   06/28/2013 3:00 PM Sherrie Mustache [facility] [phone]   06/28/2013 3:30 PM [facility] [address] [facility] [phone]   07/17/2013 1:15 PM Jeannette Corpus, MD [facility] [phone]   09/07/2013 10:45 AM Jeannette Corpus, MD [facility] [phone]   Future Orders Complete By Expires     Diet - low sodium heart healthy  As directed     Increase activity slowly  As directed         Medication List         amLODipine 5 MG tablet  Commonly known as:  NORVASC  Take 5 mg by mouth every evening.     aspirin 81 MG tablet  Take 81 mg by mouth daily.     haloperidol 1 MG tablet  Commonly known as:  HALDOL  Take 1 tablet (1 mg  total) by mouth 3 (three) times daily as needed.     HYDROcodone-acetaminophen 10-325 MG per tablet  Commonly known as:  NORCO  Take 1 tablet by mouth every 6 (six) hours as needed for pain.     hydrocortisone-pramoxine rectal foam  Commonly known as:  PROCTOFOAM-HC  Place 1 applicator rectally 2 (two) times daily.     LORazepam 0.5 MG tablet  Commonly known as:  ATIVAN  Take 1-2 tabs  under the tongue or swallow  Every 6 hrs. As needed for nausea.  Will make you drowsy     losartan-hydrochlorothiazide  100-12.5 MG per tablet  Commonly known as:  HYZAAR  Take 1 tablet by mouth every morning.     mometasone 50 MCG/ACT nasal spray  Commonly known as:  NASONEX  Place 2 sprays into the nose daily as needed (for allergies).     ondansetron 8 MG tablet  Commonly known as:  ZOFRAN  Take 1-2 tablets (8-16 mg total) by mouth every 12 (twelve) hours as needed for nausea (Will not make you drowsy).       Allergies  Allergen Reactions  . Dexamethasone Other (See Comments)    Mood disturbance, psychosis   Follow-up Information   Follow up with LIVESAY,LENNIS P, MD In 2 weeks. (hospital follow up)    Contact information:   [address] [place] [address] [phone]        The results of significant diagnostics from this hospitalization (including imaging, microbiology, ancillary and laboratory) are listed below for reference.    Significant Diagnostic Studies: Dg Chest Port 1 View  06/22/2013   *RADIOLOGY REPORT*  Clinical Data: Generalized fatigue and weakness with fever.  1 day post chemotherapy for endometrial carcinoma.  PORTABLE CHEST - 1 VIEW  Comparison: 04/20/2013  Findings: The shallow inspiration.  Heart size and pulmonary vascularity are normal.  Peribronchial thickening and perihilar streaky opacities consistent with chronic bronchitis.  No blunting of costophrenic angles.  No pneumothorax.  No focal consolidation. Surgical clip in the base of the neck.  Degenerative changes in the spine and shoulders.  No significant change in the appearance of chest since previous study.  IMPRESSION: Shallow inspiration with chronic bronchitic changes.  No evidence of active pulmonary disease.   Original Report Authenticated By: Burman Nieves, M.D.    Microbiology: Recent Results (from the past 240 hour(s))  CULTURE, BLOOD (ROUTINE X 2)     Status: None   Collection Time    06/22/13  4:12 AM      Result Value Range Status   Specimen Description BLOOD BLOOD RIGHT FOREARM   Final    Special Requests BOTTLES DRAWN AEROBIC ONLY 4CC   Final   Culture  Setup Time 06/22/2013 09:15   Final   Culture     Final   Value:        BLOOD CULTURE RECEIVED NO GROWTH TO DATE CULTURE WILL BE HELD FOR 5 DAYS BEFORE ISSUING A FINAL NEGATIVE REPORT   Report Status PENDING   Incomplete  CULTURE, BLOOD (ROUTINE X 2)     Status: None   Collection Time    06/22/13  4:13 AM      Result Value Range Status   Specimen Description BLOOD BLOOD LEFT FOREARM   Final   Special Requests BOTTLES DRAWN AEROBIC ONLY 3 CC   Final   Culture  Setup Time 06/22/2013 09:15   Final   Culture     Final   Value:  BLOOD CULTURE RECEIVED NO GROWTH TO DATE CULTURE WILL BE HELD FOR 5 DAYS BEFORE ISSUING A FINAL NEGATIVE REPORT   Report Status PENDING   Incomplete  URINE CULTURE     Status: None   Collection Time    06/22/13  7:20 AM      Result Value Range Status   Specimen Description URINE, CLEAN CATCH   Final   Special Requests NONE   Final   Culture  Setup Time 06-05-2013 10:43   Final   Colony Count NO GROWTH   Final   Culture NO GROWTH   Final   Report Status 06/23/2013 FINAL   Final     Labs: Basic Metabolic Panel:  Recent Labs Lab 06/22/13 0250 06/23/13 0540  NA 127* 136  K 3.5 3.6  CL 94* 105  CO2 22 21  GLUCOSE 126* 100*  BUN 27* 13  CREATININE 1.51* 0.98  CALCIUM 8.6 8.4   Liver Function Tests: No results found for this basename: AST, ALT, ALKPHOS, BILITOT, PROT, ALBUMIN,  in the last 168 hours No results found for this basename: LIPASE, AMYLASE,  in the last 168 hours No results found for this basename: AMMONIA,  in the last 168 hours CBC:  Recent Labs Lab 06/20/13 1033 06/22/13 0250 06/23/13 0540  WBC 5.5 19.7* 6.5  NEUTROABS 3.2 18.3*  --   HGB 13.2 11.7* 10.5*  HCT 37.8 33.4* 30.1*  MCV 86.7 86.5 87.8  PLT 292 226 196   Cardiac Enzymes: No results found for this basename: CKTOTAL, CKMB, CKMBINDEX, TROPONINI,  in the last 168 hours BNP: BNP (last 3  results) No results found for this basename: PROBNP,  in the last 8760 hours CBG: No results found for this basename: GLUCAP,  in the last 168 hours     Signed:  Marinda Elk  [facility] 06/23/2013, 4:26 PM

## 2013-06-25 ENCOUNTER — Ambulatory Visit: Payer: Medicare Other

## 2013-06-25 ENCOUNTER — Telehealth: Payer: Self-pay

## 2013-06-25 NOTE — Telephone Encounter (Signed)
Told daughter [name] that her mother needs to keep follow up appointment on 06-28-13 not in 2 weeks as mentioned in 06-24-13 discharge summary.  Ms. Misty Alvarado verbalized understanding.

## 2013-06-26 ENCOUNTER — Ambulatory Visit: Payer: Medicare Other

## 2013-06-27 ENCOUNTER — Ambulatory Visit: Payer: Medicare Other

## 2013-06-27 ENCOUNTER — Other Ambulatory Visit: Payer: Medicare Other | Admitting: Lab

## 2013-06-28 ENCOUNTER — Ambulatory Visit (HOSPITAL_BASED_OUTPATIENT_CLINIC_OR_DEPARTMENT_OTHER): Payer: Medicare Other | Admitting: Hematology and Oncology

## 2013-06-28 ENCOUNTER — Telehealth: Payer: Self-pay | Admitting: Hematology and Oncology

## 2013-06-28 ENCOUNTER — Other Ambulatory Visit (HOSPITAL_BASED_OUTPATIENT_CLINIC_OR_DEPARTMENT_OTHER): Payer: Medicare Other | Admitting: Lab

## 2013-06-28 VITALS — BP 135/82 | HR 117 | Temp 97.1°F | Resp 17 | Ht 66.0 in | Wt 192.6 lb

## 2013-06-28 DIAGNOSIS — C549 Malignant neoplasm of corpus uteri, unspecified: Secondary | ICD-10-CM

## 2013-06-28 DIAGNOSIS — IMO0002 Reserved for concepts with insufficient information to code with codable children: Secondary | ICD-10-CM

## 2013-06-28 DIAGNOSIS — C541 Malignant neoplasm of endometrium: Secondary | ICD-10-CM

## 2013-06-28 DIAGNOSIS — F22 Delusional disorders: Secondary | ICD-10-CM

## 2013-06-28 LAB — CBC WITH DIFFERENTIAL/PLATELET
BASO%: 1.1 % (ref 0.0–2.0)
EOS%: 5 % (ref 0.0–7.0)
HCT: 33.3 % — ABNORMAL LOW (ref 34.8–46.6)
LYMPH%: 36 % (ref 14.0–49.7)
MCH: 30.8 pg (ref 25.1–34.0)
MCHC: 35.7 g/dL (ref 31.5–36.0)
NEUT%: 39.4 % (ref 38.4–76.8)
RBC: 3.86 10*6/uL (ref 3.70–5.45)
lymph#: 1.6 10*3/uL (ref 0.9–3.3)

## 2013-06-28 LAB — COMPREHENSIVE METABOLIC PANEL (CC13)
ALT: 22 U/L (ref 0–55)
AST: 22 U/L (ref 5–34)
Chloride: 101 mEq/L (ref 98–109)
Creatinine: 0.9 mg/dL (ref 0.6–1.1)
Total Bilirubin: 0.97 mg/dL (ref 0.20–1.20)

## 2013-06-28 LAB — CULTURE, BLOOD (ROUTINE X 2): Culture: NO GROWTH

## 2013-06-28 NOTE — Progress Notes (Signed)
OFFICE PROGRESS NOTE   06/28/2013   Physicians::D.ClarkePearson, J.Kinard, P. 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 Major problem following the first chemotherapy was extreme agitation and paranoia, which may have been steroid psychosis. In addition to usual decadron premedication for taxol (20 mg 12 hrs and 6 hrs prior, 20 mg with IV premeds at time of treatment) she had 2 doses of decadron for aches on day 3 and day 4. After the problems began, family reported that patient has had some suspected mental health/ behavioral health problems, which family has managed without medical intervention previously. Patient is now on haldol tid by PCP Dr Frederica Kuster, which family feels is helpful. She additionally had severe taxol aches at dose of 175 mg/m2 and was neutropenic by day 10, neupogen added then. (Note family refused to take her to ED for evaluation including behavioral health assessment when those issues were extreme following the chemotherapy).  She has seen Dr Roselind Messier in consultation; he would like to reevaluate her towards completion of chemotherapy.  She does not have PAC.   ONCOLOGIC HISTORY  Patient had been in usual good health when she developed several weeks of vaginal spotting in early 2014, without other symptoms. She had endometrial biopsy 03-02-2012 [phone]) with grade 1 endometroid adenocarcinoma. She was referred to Dr Yolande Jolly, his exam not remarkable otherwise. She had colonoscopy by Dr Leone Payor on 04-23-13, with a diminutive adenoma and otherwise benign. She had CXR preoperatively 04-20-2013 with chronic bronchitic and interstitial changes and  degenerative changes of spine, otherwise not remarkable; I do not believe that she had other imaging of abdomen/ pelvis. She was taken to total robotic hysterectomy with BSO and bilateral pelvic node evaluation by Dr Duard Brady on 04-24-2013. Post operative course was uncomplicated and she was discharged home on POD 1. Pathology [phone]) found high grade papillary serous carcinoma tumor size 3.5 cm with myometrial invasion 0.5 cm where myometrium was 1.5 cm, with focal angiolymphatic invasion, 6 right pelvic and 1 left pelvic node negative, for T1aN0 staging. She saw Dr Yolande Jolly in follow up on 05-09-13, with recommendation for 6 cycles of adjuvant taxol/ carboplatin followed by vaginal vault brachytherapy. Cycle 1 taxol carbo was 05-30-13, complicated by apparent steroid psychosis, severe aching and neutropenia.  She was admitted due to dehydration, feeling better today but still very tired. Note patient is staying alone at her home now, with family checking on her.  Objective:  Vital signs in last 24 hours:  BP 135/82  Pulse 117  Temp(Src) 97.1 F (36.2 C) (Oral)  Resp 17  Ht 5\' 6"  (1.676 m)  Wt 192 lb 9.6 oz (87.363 kg)  BMI 31.1 kg/m2  Weight is down 3 lbs. Very slowly ambulatory "she is moving well" per granddaughter, able to get on and off exam table with some assistance. Very flat affect as she has had since I met her, good eye contact, minimal conversation, cooperative with exam. Head shaved but no alopecia yet.  HEENT:PERRLA, sclera clear, anicteric, oropharynx clear, no lesions and neck supple with midline trachea.  LymphaticsCervical, supraclavicular, and axillary nodes normal. Resp: clear to auscultation bilaterally and normal percussion bilaterally Cardio: regular rate and rhythm GI: soft, non-tender; bowel sounds normal; no masses,  no organomegaly Extremities: extremities normal, atraumatic,  no cyanosis or edema Neuro:no sensory deficits noted, otherwise as above. Skin  without rash or ecchymosis   Lab Results:  Results for orders placed in visit on 06/28/13  CBC WITH DIFFERENTIAL      Result Value Range   WBC 4.4  3.9 - 10.3 10e3/uL   NEUT# 1.7  1.5 - 6.5 10e3/uL   HGB 11.9  11.6 - 15.9 g/dL   HCT 16.1 (*) 09.6 - 04.5 %   Platelets 269  145 - 400 10e3/uL   MCV 86.1  79.5 - 101.0 fL   MCH 30.8  25.1 - 34.0 pg   MCHC 35.7  31.5 - 36.0 g/dL   RBC 4.09  8.11 - 9.14 10e6/uL   RDW 13.4  11.2 - 14.5 %   lymph# 1.6  0.9 - 3.3 10e3/uL   MONO# 0.8  0.1 - 0.9 10e3/uL   Eosinophils Absolute 0.2  0.0 - 0.5 10e3/uL   Basophils Absolute 0.0  0.0 - 0.1 10e3/uL   NEUT% 39.4  38.4 - 76.8 %   LYMPH% 36.0  14.0 - 49.7 %   MONO% 18.5 (*) 0.0 - 14.0 %   EOS% 5.0  0.0 - 7.0 %   BASO% 1.1  0.0 - 2.0 %  COMPREHENSIVE METABOLIC PANEL (CC13)      Result Value Range   Sodium 137  136 - 145 mEq/L   Potassium 3.4 (*) 3.5 - 5.1 mEq/L   Chloride 101  98 - 109 mEq/L   CO2 24  22 - 29 mEq/L   Glucose 121  70 - 140 mg/dl   BUN 6.2 (*) 7.0 - 78.2 mg/dL   Creatinine 0.9  0.6 - 1.1 mg/dL   Total Bilirubin 9.56  0.20 - 1.20 mg/dL   Alkaline Phosphatase 71  40 - 150 U/L   AST 22  5 - 34 U/L   ALT 22  0 - 55 U/L   Total Protein 6.8  6.4 - 8.3 g/dL   Albumin 3.5  3.5 - 5.0 g/dL   Calcium 9.2  8.4 - 21.3 mg/dL     Studies/Results:  No results found.  Medications: I have reviewed the patient's current medications.   With patient present, family and I have discussed options for treatment now and possibility of referral for behavioral health evaluation. I have offered chemotherapy with decrease in decadron premeds and decrease in taxol dose (to 135 mg/m2), or omitting taxol (and steroids) entirely from this second cycle. The daughter is most comfortable with carboplatin only for cycle 2, realizing that this is not perhaps best treatment for the cancer but at same time will keep some treatment going as we also pursue behavioral health assistance. I have been able to schedule  her to see psychologist Dr Enzo Bi with [facility] on 06-19-13, appointment time to allow daughter to accompany. In the meantime she will continue present haldol. Cycle 2 carboplatin only/ no steroids including with premeds at [facility] will be given 06-20-13 as long as ANC >=1.5 and plt >=100k; she will receive neupogen 7-10,11,12 and will see medical oncology provider on 06-27-13 with lab.   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 Vaginal brachytherapy by Dr Roselind Messier could be given during chemo if tolerated, otherwise after chemo completes. Patient completed 2 cycles of chemotherapy (C2 with carbo only).  Plan  for C3 on 7/30  But will see her in the clinic first same day. Will bring patient for IV fluid on Monday 7/21 and refer her to our nutritional team giving the poor po intake.  2.agitation and paranoia reported x several days after treatment including additional steroids for taxol aches.  Per daughter, this behavior may be exacerbation of previous tendencies. Hold steroids, refer to [facility]. Note no psychiatrist with that group, however the staff can refer if needed or will work with PCP. 3.unremarkable colonoscopy and mammograms within past month  4.HTN x years  5.degenerative arthritis knees  6.allergic rhinitis  Patient seems comfortable with plan; daughter and granddaughter are in full agreement.   Zachery Dakins, MD   06/28/2013, 4:25 PM

## 2013-06-28 NOTE — Telephone Encounter (Signed)
gv and printed appt sched and avs for pt...will call pt with d/t for ivf and tx.  emailed MW to add tx.Marland KitchenMarland Kitchen

## 2013-06-29 ENCOUNTER — Telehealth: Payer: Self-pay | Admitting: *Deleted

## 2013-06-29 ENCOUNTER — Telehealth: Payer: Self-pay | Admitting: Hematology and Oncology

## 2013-06-29 ENCOUNTER — Telehealth: Payer: Self-pay | Admitting: Internal Medicine

## 2013-06-29 NOTE — Telephone Encounter (Signed)
pt daughter call work to advised on appt.Marland KitchenMarland KitchenDone.Marland KitchenMarland KitchenNo answer.

## 2013-06-29 NOTE — Telephone Encounter (Signed)
Patient's daughter, Marin Roberts who is also a nurse, called to report that her mother's intake had decreased somewhat.   She has however tolerated po without nausea or vomiting.  Her labs from yesterday were reviewed and were reassuring.  PLAN: 1) Continue to closely monitor her po intake especially oral fluids and to encourage them as tolerated.   If she is unable to adequately hydrate due to vomiting, nausea, she should call her primary oncologist's office in am or the oncologists on call.   The patient and her daughter was agreeable with this plan and will follow up as needed.

## 2013-06-29 NOTE — Telephone Encounter (Signed)
called pt work # again with no answer just ringing....also lvm on [phone]

## 2013-06-29 NOTE — Telephone Encounter (Signed)
Per staff message and POF I have scheduled appts.  JMW  

## 2013-07-02 ENCOUNTER — Ambulatory Visit (HOSPITAL_BASED_OUTPATIENT_CLINIC_OR_DEPARTMENT_OTHER): Payer: Medicare Other

## 2013-07-02 ENCOUNTER — Other Ambulatory Visit: Payer: Self-pay | Admitting: Hematology and Oncology

## 2013-07-02 DIAGNOSIS — E860 Dehydration: Secondary | ICD-10-CM

## 2013-07-02 MED ORDER — SODIUM CHLORIDE 0.9 % IV SOLN
Freq: Once | INTRAVENOUS | Status: AC
  Administered 2013-07-02: 14:00:00 via INTRAVENOUS

## 2013-07-02 NOTE — Patient Instructions (Signed)
Dehydration, Adult Dehydration is when you lose more fluids from the body than you take in. Vital organs like the kidneys, brain, and heart cannot function without a proper amount of fluids and salt. Any loss of fluids from the body can cause dehydration.  CAUSES   Vomiting.  Diarrhea.  Excessive sweating.  Excessive urine output.  Fever. SYMPTOMS  Mild dehydration  Thirst.  Dry lips.  Slightly dry mouth. Moderate dehydration  Very dry mouth.  Sunken eyes.  Skin does not bounce back quickly when lightly pinched and released.  Dark urine and decreased urine production.  Decreased tear production.  Headache. Severe dehydration  Very dry mouth.  Extreme thirst.  Rapid, weak pulse (more than 100 beats per minute at rest).  Cold hands and feet.  Not able to sweat in spite of heat and temperature.  Rapid breathing.  Blue lips.  Confusion and lethargy.  Difficulty being awakened.  Minimal urine production.  No tears. DIAGNOSIS  Your caregiver will diagnose dehydration based on your symptoms and your exam. Blood and urine tests will help confirm the diagnosis. The diagnostic evaluation should also identify the cause of dehydration. TREATMENT  Treatment of mild or moderate dehydration can often be done at home by increasing the amount of fluids that you drink. It is best to drink small amounts of fluid more often. Drinking too much at one time can make vomiting worse. Refer to the home care instructions below. Severe dehydration needs to be treated at the hospital where you will probably be given intravenous (IV) fluids that contain water and electrolytes. HOME CARE INSTRUCTIONS   Ask your caregiver about specific rehydration instructions.  Drink enough fluids to keep your urine clear or pale yellow.  Drink small amounts frequently if you have nausea and vomiting.  Eat as you normally do.  Avoid:  Foods or drinks high in sugar.  Carbonated  drinks.  Juice.  Extremely hot or cold fluids.  Drinks with caffeine.  Fatty, greasy foods.  Alcohol.  Tobacco.  Overeating.  Gelatin desserts.  Wash your hands well to avoid spreading bacteria and viruses.  Only take over-the-counter or prescription medicines for pain, discomfort, or fever as directed by your caregiver.  Ask your caregiver if you should continue all prescribed and over-the-counter medicines.  Keep all follow-up appointments with your caregiver. SEEK MEDICAL CARE IF:  You have abdominal pain and it increases or stays in one area (localizes).  You have a rash, stiff neck, or severe headache.  You are irritable, sleepy, or difficult to awaken.  You are weak, dizzy, or extremely thirsty. SEEK IMMEDIATE MEDICAL CARE IF:   You are unable to keep fluids down or you get worse despite treatment.  You have frequent episodes of vomiting or diarrhea.  You have blood or green matter (bile) in your vomit.  You have blood in your stool or your stool looks black and tarry.  You have not urinated in 6 to 8 hours, or you have only urinated a small amount of very dark urine.  You have a fever.  You faint. MAKE SURE YOU:   Understand these instructions.  Will watch your condition.  Will get help right away if you are not doing well or get worse. Document Released: 11/29/2005 Document Revised: 02/21/2012 Document Reviewed: 07/19/2011 [employer] Patient Information 2014 [employer].  

## 2013-07-10 ENCOUNTER — Telehealth: Payer: Self-pay | Admitting: *Deleted

## 2013-07-10 ENCOUNTER — Other Ambulatory Visit (HOSPITAL_BASED_OUTPATIENT_CLINIC_OR_DEPARTMENT_OTHER): Payer: Medicare Other | Admitting: Lab

## 2013-07-10 ENCOUNTER — Telehealth: Payer: Self-pay | Admitting: Hematology and Oncology

## 2013-07-10 ENCOUNTER — Ambulatory Visit (HOSPITAL_BASED_OUTPATIENT_CLINIC_OR_DEPARTMENT_OTHER): Payer: Medicare Other | Admitting: Hematology and Oncology

## 2013-07-10 VITALS — BP 126/85 | HR 108 | Temp 98.1°F | Resp 19 | Ht 66.0 in | Wt 186.7 lb

## 2013-07-10 DIAGNOSIS — C549 Malignant neoplasm of corpus uteri, unspecified: Secondary | ICD-10-CM

## 2013-07-10 DIAGNOSIS — C541 Malignant neoplasm of endometrium: Secondary | ICD-10-CM

## 2013-07-10 LAB — CBC WITH DIFFERENTIAL/PLATELET
BASO%: 1.2 % (ref 0.0–2.0)
EOS%: 2.1 % (ref 0.0–7.0)
HCT: 31.8 % — ABNORMAL LOW (ref 34.8–46.6)
LYMPH%: 34.9 % (ref 14.0–49.7)
MCH: 30.7 pg (ref 25.1–34.0)
MCHC: 35.3 g/dL (ref 31.5–36.0)
NEUT%: 50.5 % (ref 38.4–76.8)
Platelets: 131 10*3/uL — ABNORMAL LOW (ref 145–400)

## 2013-07-10 LAB — COMPREHENSIVE METABOLIC PANEL (CC13)
ALT: 15 U/L (ref 0–55)
AST: 22 U/L (ref 5–34)
Creatinine: 1 mg/dL (ref 0.6–1.1)
Total Bilirubin: 0.91 mg/dL (ref 0.20–1.20)

## 2013-07-10 NOTE — Telephone Encounter (Signed)
gv and printed appt sched and avs for pt...emailed MW to add tx... °

## 2013-07-10 NOTE — Progress Notes (Signed)
OFFICE PROGRESS NOTE   07/10/2013   Physicians::D.ClarkePearson, J.Kinard, P. 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 Major problem following the first chemotherapy was extreme agitation and paranoia, which may have been steroid psychosis. In addition to usual decadron premedication for taxol (20 mg 12 hrs and 6 hrs prior, 20 mg with IV premeds at time of treatment) she had 2 doses of decadron for aches on day 3 and day 4. After the problems began, family reported that patient has had some suspected mental health/ behavioral health problems, which family has managed without medical intervention previously. Patient is now on haldol tid by PCP Dr Frederica Kuster, which family feels is helpful. She additionally had severe taxol aches at dose of 175 mg/m2 and was neutropenic by day 10, neupogen added then. (Note family refused to take her to ED for evaluation including behavioral health assessment when those issues were extreme following the chemotherapy).  She has seen Dr Roselind Messier in consultation; he would like to reevaluate her towards completion of chemotherapy.  She does not have PAC.   ONCOLOGIC HISTORY  Patient had been in usual good health when she developed several weeks of vaginal spotting in early 2014, without other symptoms. She had endometrial biopsy 03-02-2012 [phone]) with grade 1 endometroid adenocarcinoma. She was referred to Dr Yolande Jolly, his exam not remarkable otherwise. She had colonoscopy by Dr Leone Payor on 04-23-13, with a diminutive adenoma and otherwise benign. She had CXR preoperatively 04-20-2013 with chronic bronchitic and interstitial changes and  degenerative changes of spine, otherwise not remarkable; I do not believe that she had other imaging of abdomen/ pelvis. She was taken to total robotic hysterectomy with BSO and bilateral pelvic node evaluation by Dr Duard Brady on 04-24-2013. Post operative course was uncomplicated and she was discharged home on POD 1. Pathology [phone]) found high grade papillary serous carcinoma tumor size 3.5 cm with myometrial invasion 0.5 cm where myometrium was 1.5 cm, with focal angiolymphatic invasion, 6 right pelvic and 1 left pelvic node negative, for T1aN0 staging. She saw Dr Yolande Jolly in follow up on 05-09-13, with recommendation for 6 cycles of adjuvant taxol/ carboplatin followed by vaginal vault brachytherapy. Cycle 1 taxol carbo was 05-30-13, complicated by apparent steroid psychosis, severe aching and neutropenia.  She was admitted due to dehydration, feeling better today but still very tired. Note patient is staying alone at her home now, with family checking on her.  Objective:  Vital signs in last 24 hours:  BP 126/85  Pulse 108  Temp(Src) 98.1 F (36.7 C) (Oral)  Resp 19  Ht 5\' 6"  (1.676 m)  Wt 186 lb 11.2 oz (84.687 kg)  BMI 30.15 kg/m2  Weight is down 3 lbs. Very slowly ambulatory "she is moving well" per granddaughter, able to get on and off exam table with some assistance. Very flat affect as she has had since I met her, good eye contact, minimal conversation, cooperative with exam. Head shaved but no alopecia yet.  HEENT:PERRLA, sclera clear, anicteric, oropharynx clear, no lesions and neck supple with midline trachea.  LymphaticsCervical, supraclavicular, and axillary nodes normal. Resp: clear to auscultation bilaterally and normal percussion bilaterally Cardio: regular rate and rhythm GI: soft, non-tender; bowel sounds normal; no masses,  no organomegaly Extremities: extremities normal, atraumatic,  no cyanosis or edema Neuro:no sensory deficits noted, otherwise as above. Skin  without rash or ecchymosis   Lab Results:  Results for orders placed in visit on 07/10/13  CBC WITH DIFFERENTIAL      Result Value Range   WBC 4.1  3.9 - 10.3 10e3/uL   NEUT# 2.1  1.5 - 6.5 10e3/uL   HGB 11.2 (*) 11.6 - 15.9 g/dL   HCT 82.9 (*) 56.2 - 13.0 %   Platelets 131 (*) 145 - 400 10e3/uL   MCV 87.1  79.5 - 101.0 fL   MCH 30.7  25.1 - 34.0 pg   MCHC 35.3  31.5 - 36.0 g/dL   RBC 8.65 (*) 7.84 - 6.96 10e6/uL   RDW 13.8  11.2 - 14.5 %   lymph# 1.4  0.9 - 3.3 10e3/uL   MONO# 0.5  0.1 - 0.9 10e3/uL   Eosinophils Absolute 0.1  0.0 - 0.5 10e3/uL   Basophils Absolute 0.0  0.0 - 0.1 10e3/uL   NEUT% 50.5  38.4 - 76.8 %   LYMPH% 34.9  14.0 - 49.7 %   MONO% 11.3  0.0 - 14.0 %   EOS% 2.1  0.0 - 7.0 %   BASO% 1.2  0.0 - 2.0 %     Studies/Results:  No results found.  Medications: I have reviewed the patient's current medications.   With patient present, family and I have discussed options for treatment now and possibility of referral for behavioral health evaluation. I have offered chemotherapy with decrease in decadron premeds and decrease in taxol dose (to 135 mg/m2), or omitting taxol (and steroids) entirely from this second cycle. The daughter is most comfortable with carboplatin only for cycle 2, realizing that this is not perhaps best treatment for the cancer but at same time will keep some treatment going as we also pursue behavioral health assistance. I have been able to schedule her to see psychologist Dr Enzo Bi with [facility] on 06-19-13, appointment time to allow daughter to accompany. In the meantime she will continue present haldol. Cycle 2 carboplatin only/ no steroids including with premeds at [facility] will be given 06-20-13 as long as ANC >=1.5 and plt >=100k; she will receive neupogen 7-10,11,12 and will see medical oncology provider on 06-27-13 with lab.   Assessment/Plan:  1.T1aN0 grade 3 papillary serous endometrial carcinoma with + angiolymphatic  invasion:Due to problems after cycle 1 taxol/ [place], cycle 2 was carboplatin only and she received 3 days of neupogen following. Vaginal brachytherapy by Dr Roselind Messier could be given during chemo if tolerated, otherwise after chemo completes. Patient completed 2 cycles of chemotherapy (C2 with [place] only) and she is her today for evaluation before receiving cycle #3 tomorrow on 07/11/2013. Count is ok today and she is doing well, therefore, we will proceed with C3 as planned for tomorrow, this time we will re-add the taxol but will minimize the steroid, to hopefully avoid psychotic episodes. Plan for total of 6 cycles. Patient will return on 07/12/2013 for IV fluids then 08/01/2013 for cycle 4.  2.agitation and paranoia reported x several days after treatment including additional steroids for taxol aches.  Per daughter, this behavior may be exacerbation of previous tendencies. Hold steroids, [facility] to [facility]. Note no psychiatrist with that group, however the staff can refer if needed or will work with PCP. 3.unremarkable colonoscopy and mammograms within past month  4.HTN x years  5.degenerative arthritis knees  6.allergic rhinitis  Patient seems comfortable with plan; daughter  and granddaughter are in full agreement.   Zachery Dakins, MD   07/10/2013, 1:28 PM

## 2013-07-10 NOTE — Telephone Encounter (Signed)
Per staff message and POF I have scheduled appts.  JMW  

## 2013-07-11 ENCOUNTER — Telehealth: Payer: Self-pay | Admitting: *Deleted

## 2013-07-11 ENCOUNTER — Other Ambulatory Visit: Payer: Self-pay | Admitting: Hematology and Oncology

## 2013-07-11 ENCOUNTER — Ambulatory Visit (HOSPITAL_BASED_OUTPATIENT_CLINIC_OR_DEPARTMENT_OTHER): Payer: Medicare Other

## 2013-07-11 ENCOUNTER — Ambulatory Visit (HOSPITAL_COMMUNITY)
Admission: RE | Admit: 2013-07-11 | Discharge: 2013-07-11 | Disposition: A | Discharge status: Home / Self Care | Payer: Medicare Other | Source: Ambulatory Visit | Attending: Hematology and Oncology | Admitting: Hematology and Oncology

## 2013-07-11 ENCOUNTER — Telehealth: Payer: Self-pay | Admitting: Oncology

## 2013-07-11 VITALS — BP 132/75 | HR 85 | Temp 98.1°F | Resp 18

## 2013-07-11 DIAGNOSIS — Z5111 Encounter for antineoplastic chemotherapy: Secondary | ICD-10-CM

## 2013-07-11 DIAGNOSIS — W19XXXA Unspecified fall, initial encounter: Secondary | ICD-10-CM | POA: Insufficient documentation

## 2013-07-11 DIAGNOSIS — G319 Degenerative disease of nervous system, unspecified: Secondary | ICD-10-CM | POA: Insufficient documentation

## 2013-07-11 DIAGNOSIS — IMO0002 Reserved for concepts with insufficient information to code with codable children: Secondary | ICD-10-CM | POA: Insufficient documentation

## 2013-07-11 DIAGNOSIS — C541 Malignant neoplasm of endometrium: Secondary | ICD-10-CM

## 2013-07-11 DIAGNOSIS — C549 Malignant neoplasm of corpus uteri, unspecified: Secondary | ICD-10-CM

## 2013-07-11 MED ORDER — SODIUM CHLORIDE 0.9 % IV SOLN
480.0000 mg | Freq: Once | INTRAVENOUS | Status: AC
  Administered 2013-07-11: 480 mg via INTRAVENOUS
  Filled 2013-07-11: qty 48

## 2013-07-11 MED ORDER — DIPHENHYDRAMINE HCL 50 MG/ML IJ SOLN
50.0000 mg | Freq: Once | INTRAMUSCULAR | Status: AC
Start: 2013-07-11 — End: 2013-07-11
  Administered 2013-07-11: 50 mg via INTRAVENOUS

## 2013-07-11 MED ORDER — PACLITAXEL CHEMO INJECTION 300 MG/50ML
135.0000 mg/m2 | Freq: Once | INTRAVENOUS | Status: AC
  Administered 2013-07-11: 282 mg via INTRAVENOUS
  Filled 2013-07-11: qty 47

## 2013-07-11 MED ORDER — LORAZEPAM 1 MG PO TABS
1.0000 mg | ORAL_TABLET | Freq: Once | ORAL | Status: AC | PRN
  Administered 2013-07-11: 1 mg via ORAL

## 2013-07-11 MED ORDER — FAMOTIDINE IN NACL 20-0.9 MG/50ML-% IV SOLN
20.0000 mg | Freq: Once | INTRAVENOUS | Status: AC
  Administered 2013-07-11: 20 mg via INTRAVENOUS

## 2013-07-11 MED ORDER — SODIUM CHLORIDE 0.9 % IV SOLN
Freq: Once | INTRAVENOUS | Status: AC
  Administered 2013-07-11: 13:00:00 via INTRAVENOUS

## 2013-07-11 MED ORDER — ONDANSETRON 16 MG/50ML IVPB (CHCC)
16.0000 mg | Freq: Once | INTRAVENOUS | Status: AC
  Administered 2013-07-11: 16 mg via INTRAVENOUS

## 2013-07-11 MED ORDER — DEXAMETHASONE SODIUM PHOSPHATE 20 MG/5ML IJ SOLN
20.0000 mg | Freq: Once | INTRAMUSCULAR | Status: AC
  Administered 2013-07-11: 16 mg via INTRAVENOUS

## 2013-07-11 NOTE — Telephone Encounter (Signed)
gv and printed appt sched for pt with all appt d/t

## 2013-07-11 NOTE — Progress Notes (Signed)
Pt got up to go to restroom unassisted. Was intercepted/ assisted by Rene Kocher, NT to restroom. Nurse tech stood outside door once pt was seated on commode. She reports she then heard a thump. Found pt lying on L side on the floor. Pt reports she just "tipped over". Denies any pain. Abrasion noted to L forehead. Pt was able to stand with minimal assistance. Ambulated with standby assistance back to chair. Daughter notified.

## 2013-07-11 NOTE — Patient Instructions (Addendum)
[facility] Discharge Instructions for Patients Receiving Chemotherapy  Today you received the following chemotherapy agents: Taxol and Carboplatin.  To help prevent nausea and vomiting after your treatment, we encourage you to take your nausea medication, Ondansetron. Take one by mouth twice daily for 3 days. Begin the morning of 7/31. May take Lorazepam as needed for nausea not relieved by Ondansetron.   If you develop nausea and vomiting that is not controlled by your nausea medication, call the clinic.   BELOW ARE SYMPTOMS THAT SHOULD BE REPORTED IMMEDIATELY:  *FEVER GREATER THAN 100.5 F  *CHILLS WITH OR WITHOUT FEVER  NAUSEA AND VOMITING THAT IS NOT CONTROLLED WITH YOUR NAUSEA MEDICATION  *UNUSUAL SHORTNESS OF BREATH  *UNUSUAL BRUISING OR BLEEDING  TENDERNESS IN MOUTH AND THROAT WITH OR WITHOUT PRESENCE OF ULCERS  *URINARY PROBLEMS  *BOWEL PROBLEMS  UNUSUAL RASH Items with * indicate a potential emergency and should be followed up as soon as possible.  Feel free to call the clinic should you have any questions or concerns. The clinic phone number is [phone].

## 2013-07-11 NOTE — Telephone Encounter (Signed)
Called pt's daughter [name] to make her aware pt fell in the restroom today during treatment. Dr. Karel Jarvis assessed pt, order received for non-contrast CT of head. [name] made aware. Plans to come to office.

## 2013-07-12 ENCOUNTER — Ambulatory Visit (HOSPITAL_BASED_OUTPATIENT_CLINIC_OR_DEPARTMENT_OTHER): Payer: Medicare Other

## 2013-07-12 VITALS — BP 131/81 | HR 95 | Temp 98.2°F | Resp 17

## 2013-07-12 DIAGNOSIS — Z5189 Encounter for other specified aftercare: Secondary | ICD-10-CM

## 2013-07-12 DIAGNOSIS — C549 Malignant neoplasm of corpus uteri, unspecified: Secondary | ICD-10-CM

## 2013-07-12 DIAGNOSIS — C541 Malignant neoplasm of endometrium: Secondary | ICD-10-CM

## 2013-07-12 MED ORDER — FILGRASTIM 480 MCG/0.8ML IJ SOLN
480.0000 ug | Freq: Once | INTRAMUSCULAR | Status: AC
  Administered 2013-07-12: 480 ug via SUBCUTANEOUS
  Filled 2013-07-12: qty 0.8

## 2013-07-12 MED ORDER — SODIUM CHLORIDE 0.9 % IV SOLN
Freq: Once | INTRAVENOUS | Status: AC
  Administered 2013-07-12: 14:00:00 via INTRAVENOUS

## 2013-07-12 NOTE — Progress Notes (Signed)
Patient fell yesterday, no changes noted per family/patient.

## 2013-07-12 NOTE — Patient Instructions (Addendum)
Dehydration, Adult Dehydration is when you lose more fluids from the body than you take in. Vital organs like the kidneys, brain, and heart cannot function without a proper amount of fluids and salt. Any loss of fluids from the body can cause dehydration.  CAUSES   Vomiting.  Diarrhea.  Excessive sweating.  Excessive urine output.  Fever. SYMPTOMS  Mild dehydration  Thirst.  Dry lips.  Slightly dry mouth. Moderate dehydration  Very dry mouth.  Sunken eyes.  Skin does not bounce back quickly when lightly pinched and released.  Dark urine and decreased urine production.  Decreased tear production.  Headache. Severe dehydration  Very dry mouth.  Extreme thirst.  Rapid, weak pulse (more than 100 beats per minute at rest).  Cold hands and feet.  Not able to sweat in spite of heat and temperature.  Rapid breathing.  Blue lips.  Confusion and lethargy.  Difficulty being awakened.  Minimal urine production.  No tears. DIAGNOSIS  Your caregiver will diagnose dehydration based on your symptoms and your exam. Blood and urine tests will help confirm the diagnosis. The diagnostic evaluation should also identify the cause of dehydration. TREATMENT  Treatment of mild or moderate dehydration can often be done at home by increasing the amount of fluids that you drink. It is best to drink small amounts of fluid more often. Drinking too much at one time can make vomiting worse. Refer to the home care instructions below. Severe dehydration needs to be treated at the hospital where you will probably be given intravenous (IV) fluids that contain water and electrolytes. HOME CARE INSTRUCTIONS   Ask your caregiver about specific rehydration instructions.  Drink enough fluids to keep your urine clear or pale yellow.  Drink small amounts frequently if you have nausea and vomiting.  Eat as you normally do.  Avoid:  Foods or drinks high in sugar.  Carbonated  drinks.  Juice.  Extremely hot or cold fluids.  Drinks with caffeine.  Fatty, greasy foods.  Alcohol.  Tobacco.  Overeating.  Gelatin desserts.  Wash your hands well to avoid spreading bacteria and viruses.  Only take over-the-counter or prescription medicines for pain, discomfort, or fever as directed by your caregiver.  Ask your caregiver if you should continue all prescribed and over-the-counter medicines.  Keep all follow-up appointments with your caregiver. SEEK MEDICAL CARE IF:  You have abdominal pain and it increases or stays in one area (localizes).  You have a rash, stiff neck, or severe headache.  You are irritable, sleepy, or difficult to awaken.  You are weak, dizzy, or extremely thirsty. SEEK IMMEDIATE MEDICAL CARE IF:   You are unable to keep fluids down or you get worse despite treatment.  You have frequent episodes of vomiting or diarrhea.  You have blood or green matter (bile) in your vomit.  You have blood in your stool or your stool looks black and tarry.  You have not urinated in 6 to 8 hours, or you have only urinated a small amount of very dark urine.  You have a fever.  You faint. MAKE SURE YOU:   Understand these instructions.  Will watch your condition.  Will get help right away if you are not doing well or get worse. Document Released: 11/29/2005 Document Revised: 02/21/2012 Document Reviewed: 07/19/2011 Exit[employer] Patient Information 2014 [employer].  

## 2013-07-13 ENCOUNTER — Inpatient Hospital Stay (HOSPITAL_COMMUNITY)
Admission: EM | Admit: 2013-07-13 | Discharge: 2013-07-17 | DRG: 308 | Disposition: A | Discharge status: Home Health | Payer: Medicare Other | Attending: Internal Medicine | Admitting: Internal Medicine

## 2013-07-13 ENCOUNTER — Other Ambulatory Visit: Payer: Self-pay

## 2013-07-13 ENCOUNTER — Emergency Department (HOSPITAL_COMMUNITY): Payer: Medicare Other

## 2013-07-13 ENCOUNTER — Encounter (HOSPITAL_COMMUNITY): Payer: Self-pay | Admitting: Emergency Medicine

## 2013-07-13 DIAGNOSIS — R5383 Other fatigue: Secondary | ICD-10-CM | POA: Diagnosis present

## 2013-07-13 DIAGNOSIS — R5381 Other malaise: Secondary | ICD-10-CM | POA: Diagnosis present

## 2013-07-13 DIAGNOSIS — C541 Malignant neoplasm of endometrium: Secondary | ICD-10-CM | POA: Diagnosis present

## 2013-07-13 DIAGNOSIS — R109 Unspecified abdominal pain: Secondary | ICD-10-CM | POA: Diagnosis present

## 2013-07-13 DIAGNOSIS — N390 Urinary tract infection, site not specified: Secondary | ICD-10-CM | POA: Diagnosis present

## 2013-07-13 DIAGNOSIS — R350 Frequency of micturition: Secondary | ICD-10-CM | POA: Diagnosis present

## 2013-07-13 DIAGNOSIS — R3915 Urgency of urination: Secondary | ICD-10-CM | POA: Diagnosis present

## 2013-07-13 DIAGNOSIS — D61810 Antineoplastic chemotherapy induced pancytopenia: Secondary | ICD-10-CM | POA: Diagnosis present

## 2013-07-13 DIAGNOSIS — C549 Malignant neoplasm of corpus uteri, unspecified: Secondary | ICD-10-CM | POA: Diagnosis present

## 2013-07-13 DIAGNOSIS — I10 Essential (primary) hypertension: Secondary | ICD-10-CM | POA: Diagnosis present

## 2013-07-13 DIAGNOSIS — E43 Unspecified severe protein-calorie malnutrition: Secondary | ICD-10-CM | POA: Diagnosis present

## 2013-07-13 DIAGNOSIS — I4891 Unspecified atrial fibrillation: Principal | ICD-10-CM

## 2013-07-13 DIAGNOSIS — R21 Rash and other nonspecific skin eruption: Secondary | ICD-10-CM | POA: Diagnosis not present

## 2013-07-13 DIAGNOSIS — E7800 Pure hypercholesterolemia, unspecified: Secondary | ICD-10-CM | POA: Diagnosis present

## 2013-07-13 DIAGNOSIS — E876 Hypokalemia: Secondary | ICD-10-CM | POA: Diagnosis present

## 2013-07-13 DIAGNOSIS — E785 Hyperlipidemia, unspecified: Secondary | ICD-10-CM | POA: Diagnosis present

## 2013-07-13 DIAGNOSIS — T451X5A Adverse effect of antineoplastic and immunosuppressive drugs, initial encounter: Secondary | ICD-10-CM | POA: Diagnosis present

## 2013-07-13 LAB — CBC
Hemoglobin: 12 g/dL (ref 12.0–15.0)
MCH: 30.5 pg (ref 26.0–34.0)
MCHC: 35.7 g/dL (ref 30.0–36.0)
MCV: 85.5 fL (ref 78.0–100.0)
RBC: 3.93 MIL/uL (ref 3.87–5.11)

## 2013-07-13 LAB — COMPREHENSIVE METABOLIC PANEL
ALT: 15 U/L (ref 0–35)
BUN: 16 mg/dL (ref 6–23)
CO2: 24 mEq/L (ref 19–32)
Calcium: 8.7 mg/dL (ref 8.4–10.5)
Creatinine, Ser: 1.03 mg/dL (ref 0.50–1.10)
GFR calc Af Amer: 59 mL/min — ABNORMAL LOW (ref >90)
GFR calc non Af Amer: 51 mL/min — ABNORMAL LOW (ref >90)
Glucose, Bld: 117 mg/dL — ABNORMAL HIGH (ref 70–99)

## 2013-07-13 LAB — URINALYSIS, ROUTINE W REFLEX MICROSCOPIC
Nitrite: NEGATIVE
Protein, ur: 100 mg/dL — AB
Urobilinogen, UA: 0.2 mg/dL (ref 0.0–1.0)

## 2013-07-13 LAB — URINE MICROSCOPIC-ADD ON

## 2013-07-13 LAB — LIPASE, BLOOD: Lipase: 29 U/L (ref 11–59)

## 2013-07-13 MED ORDER — ONDANSETRON HCL 4 MG/2ML IJ SOLN
4.0000 mg | Freq: Once | INTRAMUSCULAR | Status: AC
  Administered 2013-07-13: 4 mg via INTRAVENOUS
  Filled 2013-07-13: qty 2

## 2013-07-13 MED ORDER — SODIUM CHLORIDE 0.9 % IV BOLUS (SEPSIS)
1000.0000 mL | Freq: Once | INTRAVENOUS | Status: AC
  Administered 2013-07-13: 1000 mL via INTRAVENOUS

## 2013-07-13 MED ORDER — MORPHINE SULFATE 4 MG/ML IJ SOLN
4.0000 mg | Freq: Once | INTRAMUSCULAR | Status: AC
  Administered 2013-07-13: 4 mg via INTRAVENOUS
  Filled 2013-07-13: qty 1

## 2013-07-13 MED ORDER — DILTIAZEM HCL 25 MG/5ML IV SOLN: 10.0000 mg | Freq: Once | INTRAVENOUS | Status: DC

## 2013-07-13 MED ORDER — DILTIAZEM HCL 100 MG IV SOLR
5.0000 mg/h | INTRAVENOUS | Status: DC
  Administered 2013-07-13: 10 mg/h via INTRAVENOUS
  Administered 2013-07-14: 15 mg/h via INTRAVENOUS
  Filled 2013-07-13: qty 100

## 2013-07-13 MED ORDER — IOHEXOL 300 MG/ML  SOLN
100.0000 mL | Freq: Once | INTRAMUSCULAR | Status: AC | PRN
  Administered 2013-07-13: 100 mL via INTRAVENOUS

## 2013-07-13 MED ORDER — IOHEXOL 300 MG/ML  SOLN
50.0000 mL | Freq: Once | INTRAMUSCULAR | Status: AC | PRN
  Administered 2013-07-13: 50 mL via ORAL

## 2013-07-13 MED ORDER — DILTIAZEM LOAD VIA INFUSION
10.0000 mg | Freq: Once | INTRAVENOUS | Status: AC
  Administered 2013-07-13: 10 mg via INTRAVENOUS
  Filled 2013-07-13: qty 10

## 2013-07-13 NOTE — ED Notes (Addendum)
Pt states that she had chemo on Wednesday and has been feeling weak ever since. Pt went back to cancer ctr yesterday for fluids and began having abdominal pain and sweats today. Called oncologist who told her to come to the ED. Pt denies any chest pain, n/v, diarrhea, constipation or SOB. Pt did she has has felt weaker today and dizzy.

## 2013-07-13 NOTE — ED Provider Notes (Signed)
CSN: [id number]     Arrival date & time 07/13/13  1916 History     First MD Initiated Contact with Patient 07/13/13 1931     Chief Complaint  Patient presents with  . Abdominal Pain   (Consider location/radiation/quality/duration/timing/severity/associated sxs/prior Treatment) HPI Pt presenting with c/o abdominal pain and pressure along with weakness.  She has hx of endometrial cancer and had chemo last 2 days ago.  She denies vomiting or diarrhea.  Daughter states she has been drinking somewhat but less than before chemo.  She returned to the cancer center yesterday and was given IV hydration.  She denies any change in stools.  No fever/chills.  No cough or shortness of breath.  She describes abdominal discomfort as more like a pressure diffusely.  She denies dysuria.  There are no other associated systemic symptoms, there are no other alleviating or modifying factors.   Past Medical History  Diagnosis Date  . ALLERGIC RHINITIS 10/03/2007  . HYPERLIPIDEMIA 10/03/2007  . HYPERTENSION 10/03/2007  . Ulcer     gastric  . GERD (gastroesophageal reflux disease)     "ONCE IN A WHILE"  TUMS IF NEEDED  . Arthritis     KNEES  . Endometrial cancer     new diagnosis 03/02/13   Past Surgical History  Procedure Laterality Date  . Cardiac catheterization    . Tubal ligation    . Lithotripsy      for kidney stone  . Partial thyroidectomy for a growth      YRS AGO   . Robotic assisted total hysterectomy with bilateral salpingo oopherectomy Bilateral 04/24/2013    Procedure: ROBOTIC ASSISTED TOTAL HYSTERECTOMY WITH BILATERAL SALPINGO OOPHORECTOMY,  LYMPH NODE DISSECTION;  Surgeon: Rejeana Brock A. Duard Brady, MD;  Location: [facility];  Service: Gynecology;  Laterality: Bilateral;  . Lymph node dissection N/A 04/24/2013    Procedure: LYMPH NODE DISSECTION;  Surgeon: Rejeana Brock A. Duard Brady, MD;  Location: [facility];  Service: Gynecology;  Laterality: N/A;  . Abdominal hysterectomy    . Back surgery  1968    Ruptured disc  repair   Family History  Problem Relation Age of Onset  . Arthritis Mother   . Colon cancer Neg Hx   . Cancer Sister     1 deceased from lung cancer  . Cancer Brother     2 deceased from lung cancer   History  Substance Use Topics  . Smoking status: Never Smoker   . Smokeless tobacco: Never Used  . Alcohol Use: No   OB History   Grav Para Term Preterm Abortions TAB SAB Ect Mult Living                 Review of Systems ROS reviewed and all otherwise negative except for mentioned in HPI  Allergies  Dexamethasone  Home Medications   No current outpatient prescriptions on file. BP 106/44  Pulse 75  Temp(Src) 98.3 F (36.8 C) (Oral)  Resp 16  Ht 5\' 8"  (1.727 m)  Wt 195 lb 12.3 oz (88.8 kg)  BMI 29.77 kg/m2  SpO2 95% Vitals reviewed Physical Exam Physical Examination: General appearance - alert, well appearing, and in no distress Mental status - alert, oriented to person, place, and time Eyes - no scleral icterus, no conjunctival injection Mouth - mucous membranes moist, pharynx normal without lesions Chest - clear to auscultation, no wheezes, rales or rhonchi, symmetric air entry Heart - normal rate, regular rhythm, normal S1, S2, no murmurs, rubs, clicks or gallops Abdomen -  soft, diffuse mild tenderness, no gaurding or rebound, nondistended, no masses or organomegaly Extremities - peripheral pulses normal, no pedal edema, no clubbing or cyanosis Skin - normal coloration and turgor, no rashes Psych- flat affect  ED Course   Procedures (including critical care time)   Date: 07/13/2013  Rate: 153  Rhythm: atrial fibrillation with RVR  QRS Axis: normal  Intervals: indeterminate  ST/T Wave abnormalities: t wave abnormalities inferior leads  Conduction Disutrbances:none  Narrative Interpretation:   Old EKG Reviewed: afib with rvr is new since previous tracing  CRITICAL CARE Performed by: Ethelda Chick Total critical care time: 45 Critical care time was  exclusive of separately billable procedures and treating other patients. Critical care was necessary to treat or prevent imminent or life-threatening deterioration. Critical care was time spent personally by me on the following activities: development of treatment plan with patient and/or surrogate as well as nursing, discussions with consultants, evaluation of patient's response to treatment, examination of patient, obtaining history from patient or surrogate, ordering and performing treatments and interventions, ordering and review of laboratory studies, ordering and review of radiographic studies, pulse oximetry and re-evaluation of patient's condition.   Labs Reviewed  CBC - Abnormal; Notable for the following:    HCT 33.6 (*)    All other components within normal limits  COMPREHENSIVE METABOLIC PANEL - Abnormal; Notable for the following:    Potassium 3.4 (*)    Glucose, Bld 117 (*)    GFR calc non Af Amer 51 (*)    GFR calc Af Amer 59 (*)    All other components within normal limits  URINALYSIS, ROUTINE W REFLEX MICROSCOPIC - Abnormal; Notable for the following:    APPearance TURBID (*)    Hgb urine dipstick MODERATE (*)    Protein, ur 100 (*)    Leukocytes, UA LARGE (*)    All other components within normal limits  BASIC METABOLIC PANEL - Abnormal; Notable for the following:    Potassium 3.0 (*)    Glucose, Bld 101 (*)    Calcium 7.6 (*)    GFR calc non Af Amer 54 (*)    GFR calc Af Amer 63 (*)    All other components within normal limits  CBC - Abnormal; Notable for the following:    RBC 3.02 (*)    Hemoglobin 9.3 (*)    HCT 26.3 (*)    Platelets 138 (*)    All other components within normal limits  LIPID PANEL - Abnormal; Notable for the following:    Triglycerides 239 (*)    HDL 34 (*)    VLDL 48 (*)    All other components within normal limits  MRSA PCR SCREENING  URINE CULTURE  CULTURE, BLOOD (ROUTINE X 2)  CULTURE, BLOOD (ROUTINE X 2)  LIPASE, BLOOD  URINE  MICROSCOPIC-ADD ON  LACTIC ACID, PLASMA  TSH  PROTIME-INR  APTT   Ct Abdomen Pelvis W Contrast  07/14/2013   *RADIOLOGY REPORT*  Clinical Data: 79 year old female with abdominal and pelvic pain. Patient on recent chemotherapy for endometrial cancer.  CT ABDOMEN AND PELVIS WITH CONTRAST  Technique:  Multidetector CT imaging of the abdomen and pelvis was performed following the standard protocol during bolus administration of intravenous contrast.  Contrast:  100 ml intravenous Omnipaque-300  Comparison: 03/07/2006 CT of the abdomen  Findings: The liver, gallbladder, spleen, pancreas, and adrenal glands are unremarkable. Mild bilateral renal cortical thinning is noted as well as a punctate nonobstructing left upper pole renal  calculus.  A left renal cyst is also identified.  No free fluid, enlarged lymph nodes, biliary dilation or abdominal aortic aneurysm identified.  There is no evidence of bowel obstruction or pneumoperitoneum. The bowel and bladder are within normal limits. The patient is status post hysterectomy. A small right inguinal hernia containing fat is again noted.  No acute or suspicious bony abnormalities are identified. Degenerative changes of the lumbar spine and hips are identified.  IMPRESSION: No evidence of acute abnormality.  Punctate nonobstructing left upper pole renal calculus.   Original Report Authenticated By: Harmon Pier, M.D.   1. Urinary tract infection   2. Atrial fibrillation with RVR   3. Abdominal pain   4. Endometrial cancer   5. Other and unspecified hyperlipidemia   6. Unspecified essential hypertension     MDM  Pt presenting with c/o abdominal discomfort and weakness.  She is currently on chemo for endometrial ca.  She is not neutropenic.  Abdominal CT is benign.  Pt has findings of UTI.  Urine culture sent.  She is also tachycardic on arrival - found to be in rapid atrial fibrillation- she has no hx of this.  Started on IV hydration, diltiazem drip.  HR iniitally  in 150s, controlled to approx 110 with diltiazem at this time.  She is maintaining her blood pressure at systolics of 100-110, but hesitant to be too aggressive with rate control as she does have infection and blood pressure is somewhat soft.  Pt started on rocephin for UTI.  Will also obtain lactate and blood culture      Ethelda Chick, MD 07/14/13 [phone]

## 2013-07-13 NOTE — ED Notes (Signed)
EKG old and new given to EDP, Linker, MD.

## 2013-07-14 ENCOUNTER — Other Ambulatory Visit: Payer: Self-pay

## 2013-07-14 DIAGNOSIS — I10 Essential (primary) hypertension: Secondary | ICD-10-CM

## 2013-07-14 DIAGNOSIS — E876 Hypokalemia: Secondary | ICD-10-CM | POA: Diagnosis present

## 2013-07-14 DIAGNOSIS — R109 Unspecified abdominal pain: Secondary | ICD-10-CM | POA: Diagnosis present

## 2013-07-14 DIAGNOSIS — E785 Hyperlipidemia, unspecified: Secondary | ICD-10-CM

## 2013-07-14 DIAGNOSIS — I4891 Unspecified atrial fibrillation: Principal | ICD-10-CM

## 2013-07-14 DIAGNOSIS — R21 Rash and other nonspecific skin eruption: Secondary | ICD-10-CM | POA: Diagnosis not present

## 2013-07-14 DIAGNOSIS — N390 Urinary tract infection, site not specified: Secondary | ICD-10-CM | POA: Diagnosis present

## 2013-07-14 DIAGNOSIS — C549 Malignant neoplasm of corpus uteri, unspecified: Secondary | ICD-10-CM

## 2013-07-14 LAB — PROTIME-INR: INR: 1.08 (ref 0.00–1.49)

## 2013-07-14 LAB — LIPID PANEL
LDL Cholesterol: 74 mg/dL (ref 0–99)
Triglycerides: 239 mg/dL — ABNORMAL HIGH (ref <150)
VLDL: 48 mg/dL — ABNORMAL HIGH (ref 0–40)

## 2013-07-14 LAB — CBC
Platelets: 138 10*3/uL — ABNORMAL LOW (ref 150–400)
RDW: 14.7 % (ref 11.5–15.5)

## 2013-07-14 LAB — BASIC METABOLIC PANEL
BUN: 12 mg/dL (ref 6–23)
CO2: 23 mEq/L (ref 19–32)
Chloride: 103 mEq/L (ref 96–112)
Glucose, Bld: 101 mg/dL — ABNORMAL HIGH (ref 70–99)
Potassium: 3 mEq/L — ABNORMAL LOW (ref 3.5–5.1)
Sodium: 136 mEq/L (ref 135–145)

## 2013-07-14 LAB — LACTIC ACID, PLASMA: Lactic Acid, Venous: 1.8 mmol/L (ref 0.5–2.2)

## 2013-07-14 MED ORDER — HEPARIN BOLUS VIA INFUSION
4000.0000 [IU] | Freq: Once | INTRAVENOUS | Status: AC
  Administered 2013-07-14: 4000 [IU] via INTRAVENOUS

## 2013-07-14 MED ORDER — POTASSIUM CHLORIDE 10 MEQ/100ML IV SOLN
INTRAVENOUS | Status: AC
  Filled 2013-07-14: qty 100

## 2013-07-14 MED ORDER — DILTIAZEM HCL 100 MG IV SOLR
5.0000 mg/h | INTRAVENOUS | Status: DC
  Administered 2013-07-14: 5 mg/h via INTRAVENOUS
  Filled 2013-07-14: qty 100

## 2013-07-14 MED ORDER — POTASSIUM CHLORIDE IN NACL 20-0.9 MEQ/L-% IV SOLN
INTRAVENOUS | Status: DC
  Administered 2013-07-14: 08:00:00 via INTRAVENOUS
  Administered 2013-07-14: 1000 mL via INTRAVENOUS
  Administered 2013-07-16 (×2): via INTRAVENOUS
  Filled 2013-07-14 (×9): qty 1000

## 2013-07-14 MED ORDER — ZOLPIDEM TARTRATE 5 MG PO TABS: 5.0000 mg | ORAL_TABLET | Freq: Every evening | ORAL | Status: DC | PRN

## 2013-07-14 MED ORDER — ACETAMINOPHEN 650 MG RE SUPP: 650.0000 mg | Freq: Four times a day (QID) | RECTAL | Status: DC | PRN

## 2013-07-14 MED ORDER — DIPHENHYDRAMINE-ZINC ACETATE 2-0.1 % EX CREA
TOPICAL_CREAM | Freq: Three times a day (TID) | CUTANEOUS | Status: DC | PRN
  Administered 2013-07-14 – 2013-07-16 (×2): via TOPICAL
  Filled 2013-07-14: qty 28

## 2013-07-14 MED ORDER — ONDANSETRON HCL 4 MG/2ML IJ SOLN
4.0000 mg | Freq: Four times a day (QID) | INTRAMUSCULAR | Status: DC | PRN
  Filled 2013-07-14: qty 2

## 2013-07-14 MED ORDER — SODIUM CHLORIDE 0.9 % IV BOLUS (SEPSIS)
500.0000 mL | Freq: Once | INTRAVENOUS | Status: AC
  Administered 2013-07-14: 500 mL via INTRAVENOUS

## 2013-07-14 MED ORDER — HYDROMORPHONE HCL PF 1 MG/ML IJ SOLN: 0.5000 mg | INTRAMUSCULAR | Status: DC | PRN

## 2013-07-14 MED ORDER — ONDANSETRON HCL 4 MG PO TABS: 4.0000 mg | ORAL_TABLET | Freq: Four times a day (QID) | ORAL | Status: DC | PRN

## 2013-07-14 MED ORDER — RIVAROXABAN 20 MG PO TABS
20.0000 mg | ORAL_TABLET | Freq: Every day | ORAL | Status: DC
  Administered 2013-07-14 – 2013-07-17 (×4): 20 mg via ORAL
  Filled 2013-07-14 (×6): qty 1

## 2013-07-14 MED ORDER — METOPROLOL TARTRATE 1 MG/ML IV SOLN
5.0000 mg | Freq: Once | INTRAVENOUS | Status: AC
  Administered 2013-07-14: 5 mg via INTRAVENOUS
  Filled 2013-07-14: qty 5

## 2013-07-14 MED ORDER — ACETAMINOPHEN 325 MG PO TABS: 650.0000 mg | ORAL_TABLET | Freq: Four times a day (QID) | ORAL | Status: DC | PRN

## 2013-07-14 MED ORDER — OXYCODONE HCL 5 MG PO TABS
5.0000 mg | ORAL_TABLET | ORAL | Status: DC | PRN
  Administered 2013-07-14 – 2013-07-17 (×12): 5 mg via ORAL
  Filled 2013-07-14 (×12): qty 1

## 2013-07-14 MED ORDER — HEPARIN (PORCINE) IN NACL 100-0.45 UNIT/ML-% IJ SOLN
1100.0000 [IU]/h | INTRAMUSCULAR | Status: DC
  Administered 2013-07-14: 1100 [IU]/h via INTRAVENOUS
  Filled 2013-07-14 (×2): qty 250

## 2013-07-14 MED ORDER — DEXTROSE 5 % IV SOLN
1.0000 g | Freq: Once | INTRAVENOUS | Status: AC
  Administered 2013-07-14: 1 g via INTRAVENOUS
  Filled 2013-07-14: qty 10

## 2013-07-14 MED ORDER — DEXTROSE 5 % IV SOLN: 1.0000 g | INTRAVENOUS | Status: DC

## 2013-07-14 MED ORDER — SODIUM CHLORIDE 0.9 % IV SOLN
INTRAVENOUS | Status: DC
  Administered 2013-07-14: 03:00:00 via INTRAVENOUS

## 2013-07-14 MED ORDER — CIPROFLOXACIN IN D5W 400 MG/200ML IV SOLN
400.0000 mg | Freq: Two times a day (BID) | INTRAVENOUS | Status: DC
  Administered 2013-07-14 – 2013-07-16 (×5): 400 mg via INTRAVENOUS
  Filled 2013-07-14 (×6): qty 200

## 2013-07-14 MED ORDER — HALOPERIDOL 1 MG PO TABS
1.0000 mg | ORAL_TABLET | Freq: Two times a day (BID) | ORAL | Status: DC
  Administered 2013-07-14 – 2013-07-17 (×7): 1 mg via ORAL
  Filled 2013-07-14 (×10): qty 1

## 2013-07-14 MED ORDER — ALUM & MAG HYDROXIDE-SIMETH 200-200-20 MG/5ML PO SUSP
30.0000 mL | Freq: Four times a day (QID) | ORAL | Status: DC | PRN
Start: 2013-07-14 — End: 2013-07-17
  Administered 2013-07-16: 30 mL via ORAL
  Filled 2013-07-14: qty 30

## 2013-07-14 NOTE — Progress Notes (Signed)
0300-0700 Summary; Misty Alvarado arrived in controlled atrial fibrillation. She was on a Cardizem infusion at that time. Her BP did not tolerate Cardizem. Lenny Pastel, NP, notified. bolus given, and Cardizem infusion stopped. She converted to NSR at 0640am, with improvement in her BP. She is having difficulty urinating.

## 2013-07-14 NOTE — Progress Notes (Signed)
ANTICOAGULATION CONSULT NOTE - Initial Consult  Pharmacy Consult for Heparin Indication: atrial fibrillation  Allergies  Allergen Reactions  . Dexamethasone Other (See Comments)    Mood disturbance, psychosis    Patient Measurements: Height: 5\' 8"  (172.7 cm) Weight: 187 lb (84.823 kg) IBW/kg (Calculated) : 63.9 Heparin Dosing Weight: 81 kg  Vital Signs: Temp: 97.4 F (36.3 C) (08/01 1949) Temp src: Oral (08/01 1949) BP: 101/51 mmHg (08/01 2330) Pulse Rate: 81 (08/01 2330)  Labs:  Recent Labs  07/13/13 2025  HGB 12.0  HCT 33.6*  PLT 150  CREATININE 1.03    Estimated Creatinine Clearance: 52.2 ml/min (by C-G formula based on Cr of 1.03).   Medical History: Past Medical History  Diagnosis Date  . ALLERGIC RHINITIS 10/03/2007  . HYPERLIPIDEMIA 10/03/2007  . HYPERTENSION 10/03/2007  . Ulcer     gastric  . GERD (gastroesophageal reflux disease)     "ONCE IN A WHILE"  TUMS IF NEEDED  . Arthritis     KNEES  . Endometrial cancer     new diagnosis 03/02/13    Medications:  Scheduled:  . metoprolol  5 mg Intravenous Once   Infusions:  . cefTRIAXone (ROCEPHIN)  IV    . diltiazem (CARDIZEM) infusion 20 mg/hr (07/14/13 0050)    Assessment:  79 year old female with complaint of abdominal pain/pressure and weakness.  H/O endometrial cancer and undergoing chemo.  Patient found to be in AFib with RVR  IV Heparin to begin  Not on oral anticoagulation prior to admission  Goal of Therapy:  Heparin level 0.3-0.7 units/ml Monitor platelets by anticoagulation protocol: Yes   Plan:   Check baseline PTT and PT/INR  Heparin 4000 units IV bolus x 1 followed by drip @ 1100 units/hr  Check heparin level 8 hrs after heparin started  Check daily heparin level & CBC while on heparin  Darien Mignogna, Joselyn Glassman, PharmD 07/14/2013,12:51 AM

## 2013-07-14 NOTE — Progress Notes (Addendum)
[facility] PROGRESS NOTE  Misty Alvarado BJY:[id number] DOB: 05-18-1933 DOA: 07/13/2013 PCP: Rogelia Boga, MD  Brief narrative: Misty Alvarado is an 79 y.o. female with a PMH of endometrial cancer diagnosed 02/2013 who is currently undergoing chemotherapy was admitted on 07/14/2013 with a chief complaint of left lower quadrant/pelvic pain, urinary frequency/urgency. Upon initial evaluation emergency department, the patient was found to be in atrial fibrillation with rapid ventricular response.  She spontaneously converted to normal sinus rhythm overnight 07/14/2013.  Assessment/Plan: Principal Problem:   Atrial fibrillation with RVR -Admitted to the step down unit and placed on a Cardizem drip. -Heparin per pharmacy consult ordered.  ChA2DS2-VASc score = 4, with a projected stroke rate risk of 4% per year so we'll need chronic anticoagulation. The risks and benefits of chronic anticoagulation were fully discussed with the patient and all of her questions were answered.  We'll ask the pharmacy to transition to Xarelto. Active Problems:   Rash -Possibly a allergy to Rocephin. Stop Rocephin. -Benadryl cream as needed.   Hypokalemia -Add potassium to IV fluids.   HYPERLIPIDEMIA -Not currently on therapy for this. Check fasting lipid panel.   HYPERTENSION -Currently being managed with amlodipine and Hyzaar.   Endometrial cancer -Status post optimal debulking 04-24-2013 and first cycle of adjuvant taxol carboplatin given 05-30-2013, which she tolerated extremely poorly (developed agitation and paranoia thought to be secondary to steroids). She has now finished a total of 3 cycles of chemotherapy with her fourth cycle planned for 08/01/2013 out of a total of 6 planned courses. -Plans to have brachytherapy at end of chemotherapy.   Urinary tract infection -On empiric Rocephin. Change to Cipro given rash. Followup urine cultures.   Abdominal pain -Multifactorial with endometrial  cancer status post robotic hysterectomy on 04/24/2013 and UTI contributory.   Code Status: Full. Family Communication: No family currently at the bedside. Disposition Plan: Home when stable.   Medical Consultants:  None.  Other Consultants:  None.  Anti-infectives:  Rocephin 07/14/2013--->  HPI/Subjective: Misty Alvarado continues to have some mild abdominal pain. No nausea or vomiting. No chest pain or palpitations.  Objective: Filed Vitals:   07/14/13 0300 07/14/13 0400 07/14/13 0515 07/14/13 0630  BP: 110/58 108/53 84/42 87/43   Pulse: 32 75    Temp: 98.8 F (37.1 C) 98.6 F (37 C)    TempSrc: Oral Oral    Resp: 26 14 17 17   Height: 5\' 8"  (1.727 m)     Weight: 88.8 kg (195 lb 12.3 oz)     SpO2: 84% 95% 92% 93%    Intake/Output Summary (Last 24 hours) at 07/14/13 0710 Last data filed at 07/14/13 0600  Gross per 24 hour  Intake    724 ml  Output    550 ml  Net    174 ml    Exam: Gen:  NAD Cardiovascular:  RRR, No M/R/G Respiratory:  Lungs CTAB Gastrointestinal:  Abdomen soft, NT/ND, + BS Extremities:  No C/E/C Skin: Diffuse erythematous macular rash to trunk.  Data Reviewed: Basic Metabolic Panel:  Recent Labs Lab 07/10/13 1257 07/13/13 2025  NA 141 136  K 3.4* 3.4*  CL  --  100  CO2 26 24  GLUCOSE 107 117*  BUN 12.2 16  CREATININE 1.0 1.03  CALCIUM 9.2 8.7   GFR Estimated Creatinine Clearance: 53.4 ml/min (by C-G formula based on Cr of 1.03). Liver Function Tests:  Recent Labs Lab 07/10/13 1257 07/13/13 2025  AST 22 22  ALT 15 15  ALKPHOS 63 66  BILITOT 0.91 0.6  PROT 6.9 6.3  ALBUMIN 3.7 3.5    Recent Labs Lab 07/13/13 2025  LIPASE 29   Coagulation profile  Recent Labs Lab 07/14/13 0057  INR 1.08    CBC:  Recent Labs Lab 07/10/13 1257 07/13/13 2025  WBC 4.1 9.2  NEUTROABS 2.1  --   HGB 11.2* 12.0  HCT 31.8* 33.6*  MCV 87.1 85.5  PLT 131* 150   Thyroid function studies  Recent Labs  07/14/13 0057  TSH  2.220   Microbiology Recent Results (from the past 240 hour(s))  MRSA PCR SCREENING     Status: None   Collection Time    07/14/13  3:08 AM      Result Value Range Status   MRSA by PCR NEGATIVE  NEGATIVE Final   Comment:            The GeneXpert MRSA Assay (FDA     approved for NASAL specimens     only), is one component of a     comprehensive MRSA colonization     surveillance program. It is not     intended to diagnose MRSA     infection nor to guide or     monitor treatment for     MRSA infections.     Procedures and Diagnostic Studies: Ct Head Wo Contrast  11-17-2013   *RADIOLOGY REPORT*  Clinical Data: Head trauma secondary to a fall.  Abrasion to the left side of forehead.  CT HEAD WITHOUT CONTRAST  Technique:  Contiguous axial images were obtained from the base of the skull through the vertex without contrast.  Comparison: None.  Findings: There is no acute intracranial hemorrhage or mass lesion. There is fairly severe frontal and temporal lobe atrophy.  There is a vague area of lucency low in the right basal ganglia which may represent an old lacunar infarct.  Small areas of lucency in the anterior limbs of both internal capsules consistent with small vessel ischemic disease.  IMPRESSION: No acute intracranial abnormality.  Frontal and temporal lobe atrophy.  Probable old right basal ganglia lacunar infarct.   Original Report Authenticated By: Francene Boyers, M.D.   Ct Abdomen Pelvis W Contrast  07/14/2013   *RADIOLOGY REPORT*  Clinical Data: 79 year old female with abdominal and pelvic pain. Patient on recent chemotherapy for endometrial cancer.  CT ABDOMEN AND PELVIS WITH CONTRAST  Technique:  Multidetector CT imaging of the abdomen and pelvis was performed following the standard protocol during bolus administration of intravenous contrast.  Contrast:  100 ml intravenous Omnipaque-300  Comparison: 03/07/2006 CT of the abdomen  Findings: The liver, gallbladder, spleen, pancreas, and  adrenal glands are unremarkable. Mild bilateral renal cortical thinning is noted as well as a punctate nonobstructing left upper pole renal calculus.  A left renal cyst is also identified.  No free fluid, enlarged lymph nodes, biliary dilation or abdominal aortic aneurysm identified.  There is no evidence of bowel obstruction or pneumoperitoneum. The bowel and bladder are within normal limits. The patient is status post hysterectomy. A small right inguinal hernia containing fat is again noted.  No acute or suspicious bony abnormalities are identified. Degenerative changes of the lumbar spine and hips are identified.  IMPRESSION: No evidence of acute abnormality.  Punctate nonobstructing left upper pole renal calculus.   Original Report Authenticated By: Harmon Pier, M.D.    Scheduled Meds: . [START ON 07/15/2013] cefTRIAXone (ROCEPHIN)  IV  1 g Intravenous Q24H   Continuous  Infusions: . sodium chloride 75 mL/hr at 07/14/13 0300  . diltiazem (CARDIZEM) infusion 5 mg/hr (07/14/13 0300)  . heparin 1,100 Units/hr (07/14/13 0116)    Time spent: 35 minutes with greater than 50% of the time spent counseling the patient about her diagnosis of atrial fibrillation, discussing various treatment options, clinical impression, and plan of care.   LOS: 1 day   RAMA,CHRISTINA  [facility] Pager [phone].   *Please note that the hospitalists switch teams on Wednesdays. Please call the flow manager at [phone] if you are having difficulty reaching the hospitalist taking care of this patient as she can update you and provide the most up-to-date pager number of provider caring for the patient. If 8PM-8AM, please contact night-coverage at [email or web address], password [facility]  07/14/2013, 7:10 AM

## 2013-07-14 NOTE — H&P (Signed)
[facility] History and Physical  AXEL MEAS UJW:[id number] DOB: 1933-06-07 DOA: 07/13/2013  Referring physician: EDP PCP: Rogelia Boga, MD  Specialists:   Chief Complaint: ABD Pain  HPI: Misty Alvarado is a 79 y.o. female with a history of endometrial cancer diagnosed in 02/2013 who is currently undergoing chemotherapy and presents to the ED with complaints of LLQ and pelvic area pain and urinary frequency and urgency.   She was evaluated in the ED and was found to have Atrial fibrillation with RVR at 134, and was placed on a Diltiazem drip, and her laboratory studies revealed an UTI. A urine C+S was sent and she was placed on IV Rocephin.   She was referred for admission.      Review of Systems: The patient denies anorexia, fever, chills, headaches, weight loss, vision loss, diplopia, dizziness, decreased hearing, rhinitis, hoarseness, chest pain, syncope, dyspnea on exertion, peripheral edema, balance deficits, cough, hemoptysis, abdominal pain, nausea, vomiting, diarrhea, constipation, hematemesis, melena, hematochezia, severe indigestion/heartburn, hematuria, incontinence, suspicious skin lesions, transient blindness, difficulty walking, depression, unusual weight change, abnormal bleeding, enlarged lymph nodes, angioedema, and breast masses.    Past Medical History  Diagnosis Date  . ALLERGIC RHINITIS 10/03/2007  . HYPERLIPIDEMIA 10/03/2007  . HYPERTENSION 10/03/2007  . Ulcer     gastric  . GERD (gastroesophageal reflux disease)     "ONCE IN A WHILE"  TUMS IF NEEDED  . Arthritis     KNEES  . Endometrial cancer     new diagnosis 03/02/13    Past Surgical History  Procedure Laterality Date  . Cardiac catheterization    . Tubal ligation    . Lithotripsy      for kidney stone  . Partial thyroidectomy for a growth      YRS AGO   . Robotic assisted total hysterectomy with bilateral salpingo oopherectomy Bilateral 04/24/2013    Procedure: ROBOTIC ASSISTED  TOTAL HYSTERECTOMY WITH BILATERAL SALPINGO OOPHORECTOMY,  LYMPH NODE DISSECTION;  Surgeon: Rejeana Brock A. Duard Brady, MD;  Location: [facility];  Service: Gynecology;  Laterality: Bilateral;  . Lymph node dissection N/A 04/24/2013    Procedure: LYMPH NODE DISSECTION;  Surgeon: Rejeana Brock A. Duard Brady, MD;  Location: [facility];  Service: Gynecology;  Laterality: N/A;  . Abdominal hysterectomy    . Back surgery  1968    Ruptured disc repair    Prior to Admission medications   Medication Sig Start Date End Date Taking? Authorizing Provider  amLODipine (NORVASC) 5 MG tablet Take 5 mg by mouth every evening.   Yes Historical Provider, MD  aspirin 81 MG tablet Take 81 mg by mouth daily.    Yes Historical Provider, MD  haloperidol (HALDOL) 1 MG tablet Take 1 mg by mouth 2 (two) times daily. 8:00 am and 8:00 pm   Yes Historical Provider, MD  HYDROcodone-acetaminophen (NORCO) 10-325 MG per tablet Take 1 tablet by mouth every 6 (six) hours as needed for pain.   Yes Historical Provider, MD  hydrocortisone-pramoxine [facility]) rectal foam Place 1 applicator rectally 2 (two) times daily as needed for hemorrhoids.   Yes Historical Provider, MD  LORazepam (ATIVAN) 0.5 MG tablet Take 1-2 tabs  under the tongue or swallow  Every 6 hrs. As needed for nausea.  Will make you drowsy 05/23/13  Yes Lennis Buzzy Han, MD  losartan-hydrochlorothiazide (HYZAAR) 100-12.5 MG per tablet Take 1 tablet by mouth every morning.   Yes Historical Provider, MD  mometasone (NASONEX) 50 MCG/ACT nasal spray Place 2 sprays into the nose  daily as needed (for allergies).   Yes Historical Provider, MD  ondansetron (ZOFRAN) 8 MG tablet Take 1-2 tablets (8-16 mg total) by mouth every 12 (twelve) hours as needed for nausea (Will not make you drowsy). 05/23/13  Yes Lennis Buzzy Han, MD    Allergies  Allergen Reactions  . Dexamethasone Other (See Comments)    Mood disturbance, psychosis    Social History:  reports that she has never smoked. She has never used  smokeless tobacco. She reports that she does not drink alcohol or use illicit drugs.     Family History  Problem Relation Age of Onset  . Arthritis Mother   . Colon cancer Neg Hx   . Cancer Sister     1 deceased from lung cancer  . Cancer Brother     2 deceased from lung cancer      Physical Exam:  GEN:  Pleasant Elderly frail Obese   79 y.o. Caucasian female  examined  and in no acute distress; cooperative with exam Filed Vitals:   07/14/13 0200 07/14/13 0215 07/14/13 0300 07/14/13 0400  BP:  95/60 110/58 108/53  Pulse: 83 72 32 75  Temp:   98.8 F (37.1 C) 98.6 F (37 C)  TempSrc:   Oral Oral  Resp: 21 22 26 14   Height:   5\' 8"  (1.727 m)   Weight:   88.8 kg (195 lb 12.3 oz)   SpO2: 94% 93% 84% 95%   Blood pressure 108/53, pulse 75, temperature 98.6 F (37 C), temperature source Oral, resp. rate 14, height 5\' 8"  (1.727 m), weight 88.8 kg (195 lb 12.3 oz), SpO2 95.00%. PSYCH: She is alert and oriented x4; does not appear anxious does not appear depressed; affect is normal HEENT: Normocephalic and Atraumatic, Mucous membranes pink; PERRLA; EOM intact; Fundi:  Benign;  No scleral icterus, Nares: Patent, Oropharynx: Clear, Fair Dentition, Neck:  FROM, no cervical lymphadenopathy nor thyromegaly or carotid bruit; no JVD; Breasts:: Not examined CHEST WALL: No tenderness CHEST: Normal respiration, clear to auscultation bilaterally HEART: Regular rate and rhythm; no murmurs rubs or gallops BACK: No kyphosis or scoliosis; no CVA tenderness ABDOMEN: Positive Bowel Sounds, Obese, soft non-tender; no masses, no organomegaly, no pannus; no intertriginous candida. Rectal Exam: Not done EXTREMITIES: No cyanosis, clubbing or edema; no ulcerations. Genitalia: not examined PULSES: 2+ and symmetric SKIN: Normal hydration no rash or ulceration CNS: Cranial nerves 2-12 grossly intact no focal neurologic deficit    Labs on Admission:  Basic Metabolic Panel:  Recent Labs Lab  07/10/13 1257 07/13/13 2025  NA 141 136  K 3.4* 3.4*  CL  --  100  CO2 26 24  GLUCOSE 107 117*  BUN 12.2 16  CREATININE 1.0 1.03  CALCIUM 9.2 8.7   Liver Function Tests:  Recent Labs Lab 07/10/13 1257 07/13/13 2025  AST 22 22  ALT 15 15  ALKPHOS 63 66  BILITOT 0.91 0.6  PROT 6.9 6.3  ALBUMIN 3.7 3.5    Recent Labs Lab 07/13/13 2025  LIPASE 29   No results found for this basename: AMMONIA,  in the last 168 hours CBC:  Recent Labs Lab 07/10/13 1257 07/13/13 2025  WBC 4.1 9.2  NEUTROABS 2.1  --   HGB 11.2* 12.0  HCT 31.8* 33.6*  MCV 87.1 85.5  PLT 131* 150   Cardiac Enzymes: No results found for this basename: CKTOTAL, CKMB, CKMBINDEX, TROPONINI,  in the last 168 hours  BNP (last 3 results) No results found for  this basename: PROBNP,  in the last 8760 hours CBG: No results found for this basename: GLUCAP,  in the last 168 hours  Radiological Exams on Admission: Ct Abdomen Pelvis W Contrast  07/14/2013   *RADIOLOGY REPORT*  Clinical Data: 79 year old female with abdominal and pelvic pain. Patient on recent chemotherapy for endometrial cancer.  CT ABDOMEN AND PELVIS WITH CONTRAST  Technique:  Multidetector CT imaging of the abdomen and pelvis was performed following the standard protocol during bolus administration of intravenous contrast.  Contrast:  100 ml intravenous Omnipaque-300  Comparison: 03/07/2006 CT of the abdomen  Findings: The liver, gallbladder, spleen, pancreas, and adrenal glands are unremarkable. Mild bilateral renal cortical thinning is noted as well as a punctate nonobstructing left upper pole renal calculus.  A left renal cyst is also identified.  No free fluid, enlarged lymph nodes, biliary dilation or abdominal aortic aneurysm identified.  There is no evidence of bowel obstruction or pneumoperitoneum. The bowel and bladder are within normal limits. The patient is status post hysterectomy. A small right inguinal hernia containing fat is again  noted.  No acute or suspicious bony abnormalities are identified. Degenerative changes of the lumbar spine and hips are identified.  IMPRESSION: No evidence of acute abnormality.  Punctate nonobstructing left upper pole renal calculus.   Original Report Authenticated By: Harmon Pier, M.D.     EKG: Independently reviewed. Atrial fibrillation rate 134 no Acute S-T  Changes.      Assessment/Plan Principal Problem:   Atrial fibrillation with RVR Active Problems:   Urinary tract infection   Abdominal pain   HYPERLIPIDEMIA   HYPERTENSION   Endometrial cancer   1.  Atrial Fibrillation with RVR- Admitted to Stepdown and placed on an IV Diltiazem drip, and heart rate is improving.     2.  UTI- Urine C+S sent in ED, IV Rocephin started.    3.  ABD Pain- Due to UTI and Endometrial Cancer S/p surgery,  Pain Control PRN.    4.  HTN-   Continue home meds Amlodipine and Hyzaar.  monitor BPS.    5.  Hyperlipidemia-  Check lipid panel.   Currently on no cholesterol meds.    6.  Endometrial Cancer- undergoing chemotherapy, notify Oncolgy of admit.  7.  DVT prophylaxis with Lovenox.     Code Status:   FULL CODE Family Communication:    No Family Present Disposition Plan:     Return to Home on Discharge    Time spent:  71 Minutes  Ron Parker [facility] Pager [phone]  If 7PM-7AM, please contact night-coverage [email or web address] Password [facility] 07/14/2013, 5:58 AM

## 2013-07-14 NOTE — Progress Notes (Signed)
ANTICOAGULATION CONSULT NOTE - Initial Consult  Pharmacy Consult for Xarelto Indication: atrial fibrillation  Patient Measurements: Height: 5\' 8"  (172.7 cm) Weight: 195 lb 12.3 oz (88.8 kg) IBW/kg (Calculated) : 63.9  Vital Signs: Temp: 98.3 F (36.8 C) (08/02 0750) Temp src: Oral (08/02 0750) BP: 106/56 mmHg (08/02 0800) Pulse Rate: 75 (08/02 0400)  Labs:  Recent Labs  07/13/13 2025 07/14/13 0057  HGB 12.0  --   HCT 33.6*  --   PLT 150  --   APTT  --  24  LABPROT  --  13.8  INR  --  1.08  CREATININE 1.03  --    Estimated Creatinine Clearance: 53.4 ml/min (by C-G formula based on Cr of 1.03).  Medical History: Past Medical History  Diagnosis Date  . ALLERGIC RHINITIS 10/03/2007  . HYPERLIPIDEMIA 10/03/2007  . HYPERTENSION 10/03/2007  . Ulcer     gastric  . GERD (gastroesophageal reflux disease)     "ONCE IN A WHILE"  TUMS IF NEEDED  . Arthritis     KNEES  . Endometrial cancer     new diagnosis 03/02/13   Medications:  Scheduled:  . ciprofloxacin  400 mg Intravenous BID  . potassium chloride      . rivaroxaban  20 mg Oral Q breakfast    Assessment: 79 yoF admit with LLQ pain, urinary frequency/urgency. Found in Afib during ED eval, Diltiazem infusion, Heparin infusion by pharmacy begun. Conversion to NSR overnight. Discontinue Heparin, begin Xarelto, needs chronic anticoagulation.  Begin Xarelto 20mg  daily when Heparin infusion discontinued.  Rash noted, abx changed from Rocephin to Cipro  Diltiazem d/c.  Goal of Therapy: Monitor renal function, CBC   Plan:  Xarelto 20mg  daily  Otho Bellows PharmD Pager [phone] 07/14/2013, 9:37 AM

## 2013-07-15 DIAGNOSIS — E43 Unspecified severe protein-calorie malnutrition: Secondary | ICD-10-CM | POA: Diagnosis present

## 2013-07-15 LAB — BASIC METABOLIC PANEL
Calcium: 7.7 mg/dL — ABNORMAL LOW (ref 8.4–10.5)
Chloride: 103 mEq/L (ref 96–112)
Creatinine, Ser: 0.88 mg/dL (ref 0.50–1.10)
GFR calc Af Amer: 72 mL/min — ABNORMAL LOW (ref >90)
GFR calc non Af Amer: 62 mL/min — ABNORMAL LOW (ref >90)

## 2013-07-15 LAB — URINE CULTURE

## 2013-07-15 LAB — MAGNESIUM: Magnesium: 1.2 mg/dL — ABNORMAL LOW (ref 1.5–2.5)

## 2013-07-15 MED ORDER — ENSURE COMPLETE PO LIQD
237.0000 mL | Freq: Three times a day (TID) | ORAL | Status: DC
  Administered 2013-07-15 (×2): 237 mL via ORAL

## 2013-07-15 MED ORDER — POTASSIUM CHLORIDE CRYS ER 20 MEQ PO TBCR
40.0000 meq | EXTENDED_RELEASE_TABLET | Freq: Two times a day (BID) | ORAL | Status: AC
  Administered 2013-07-15 (×2): 40 meq via ORAL
  Filled 2013-07-15 (×2): qty 2

## 2013-07-15 MED ORDER — BOOST PLUS PO LIQD
237.0000 mL | Freq: Three times a day (TID) | ORAL | Status: DC
  Filled 2013-07-15: qty 237

## 2013-07-15 NOTE — Patient Care Conference (Signed)
Report called to [place], Charity fundraiser. Patient to be transferred to [id number].

## 2013-07-15 NOTE — Progress Notes (Signed)
INITIAL NUTRITION ASSESSMENT  Patient meets the criteria for severe MALNUTRITION in the context of chronic illness with 11% weight loss in 3 months and PO intake <75% of estimated needs.   DOCUMENTATION CODES Per approved criteria  -Severe malnutrition in the context of chronic illness   INTERVENTION: 1. Ensure Complete mixed with ice cream TID.  2. RD to follow per nutrition plan of care  NUTRITION DIAGNOSIS: Inadequate oral intake related to decreased appetite as evidenced by <70% meal intake.   Goal: Patient will meet >/=90% of estimated nutrition needs  Monitor:  PO intake, weight, labs  Reason for Assessment: Malnutrition screening  79 y.o. female  Admitting Dx: Atrial fibrillation with RVR  ASSESSMENT: Patient with a history of endometrial cancer, currently undergoing chemotherapy. She reports that her oral intake is fair. She has not tried nutrition supplements, but would like some here. She has lost 11% of her usual weight in 3 months.   Height: Ht Readings from Last 1 Encounters:  07/15/13 5\' 8"  (1.727 m)    Weight: Wt Readings from Last 1 Encounters:  07/15/13 197 lb 8.5 oz (89.6 kg)  ? Current weight. Admit weight: 187 pounds  Ideal Body Weight: 140 pounds  % Ideal Body Weight: 141%  Wt Readings from Last 10 Encounters:  07/15/13 197 lb 8.5 oz (89.6 kg)  07/10/13 186 lb 11.2 oz (84.687 kg)  06/28/13 192 lb 9.6 oz (87.363 kg)  06/22/13 202 lb 9.6 oz (91.9 kg)  06/13/13 202 lb (91.627 kg)  06/06/13 205 lb 4.8 oz (93.123 kg)  06/06/13 205 lb 8 oz (93.214 kg)  05/23/13 209 lb 12.8 oz (95.165 kg)  05/09/13 205 lb 6.4 oz (93.169 kg)  04/24/13 212 lb (96.163 kg)    Usual Body Weight: 210 pounds (~May 2014)  % Usual Body Weight: 89% (using admit weight)  BMI:  Body mass index is 28.43 kg/(m^2). Patient is overweight, using admit weight.    Estimated Nutritional Needs: Kcal: 1900-2050 kcal Protein: 100-115 g Fluid: >2.6 L/day  Skin: Intact  Diet  Order: Cardiac  EDUCATION NEEDS: -No education needs identified at this time   Intake/Output Summary (Last 24 hours) at 07/15/13 1439 Last data filed at 07/15/13 1343  Gross per 24 hour  Intake   1725 ml  Output   1975 ml  Net   -250 ml    Last BM: PTA   Labs:   Recent Labs Lab 07/13/13 2025 07/14/13 0930 07/15/13 0905  NA 136 136 137  K 3.4* 3.0* 2.9*  CL 100 103 103  CO2 24 23 25   BUN 16 12 8   CREATININE 1.03 0.98 0.88  CALCIUM 8.7 7.6* 7.7*  MG  --   --  1.2*  GLUCOSE 117* 101* 116*    CBG (last 3)  No results found for this basename: GLUCAP,  in the last 72 hours  Scheduled Meds: . ciprofloxacin  400 mg Intravenous BID  . haloperidol  1 mg Oral BID  . lactose free nutrition  237 mL Oral TID WC  . potassium chloride  40 mEq Oral BID  . rivaroxaban  20 mg Oral Q breakfast    Continuous Infusions: . 0.9 % NaCl with KCl 20 mEq / L 1,000 mL (07/14/13 2225)    Past Medical History  Diagnosis Date  . ALLERGIC RHINITIS 10/03/2007  . HYPERLIPIDEMIA 10/03/2007  . HYPERTENSION 10/03/2007  . Ulcer     gastric  . GERD (gastroesophageal reflux disease)     "ONCE IN  A WHILE"  TUMS IF NEEDED  . Arthritis     KNEES  . Endometrial cancer     new diagnosis 03/02/13    Past Surgical History  Procedure Laterality Date  . Cardiac catheterization    . Tubal ligation    . Lithotripsy      for kidney stone  . Partial thyroidectomy for a growth      YRS AGO   . Robotic assisted total hysterectomy with bilateral salpingo oopherectomy Bilateral 04/24/2013    Procedure: ROBOTIC ASSISTED TOTAL HYSTERECTOMY WITH BILATERAL SALPINGO OOPHORECTOMY,  LYMPH NODE DISSECTION;  Surgeon: Rejeana Brock A. Duard Brady, MD;  Location: [facility];  Service: Gynecology;  Laterality: Bilateral;  . Lymph node dissection N/A 04/24/2013    Procedure: LYMPH NODE DISSECTION;  Surgeon: Rejeana Brock A. Duard Brady, MD;  Location: [facility];  Service: Gynecology;  Laterality: N/A;  . Abdominal hysterectomy    . Back surgery   1968    Ruptured disc repair    Linnell Fulling, RD, LDN Pager #: [phone] After-Hours Pager #: [phone]

## 2013-07-15 NOTE — Progress Notes (Addendum)
[facility] PROGRESS NOTE  Misty Alvarado JYN:[id number] DOB: Nov 25, 1933 DOA: 07/13/2013 PCP: Rogelia Boga, MD  Brief narrative: Misty Alvarado is an 78 y.o. female with a PMH of endometrial cancer diagnosed 02/2013 who is currently undergoing chemotherapy was admitted on 07/14/2013 with a chief complaint of left lower quadrant/pelvic pain, urinary frequency/urgency. Upon initial evaluation emergency department, the patient was found to be in atrial fibrillation with rapid ventricular response.  She spontaneously converted to normal sinus rhythm overnight 07/14/2013.  Assessment/Plan: Principal Problem:   Atrial fibrillation with RVR -Admitted to the step down unit and placed on a Cardizem drip. -Heparin per pharmacy consult ordered.  ChA2DS2-VASc score = 4, with a projected stroke rate risk of 4% per year so we'll need chronic anticoagulation. The risks and benefits of chronic anticoagulation were fully discussed with the patient and all of her questions were answered.  We'll ask the pharmacy to transition to Xarelto. -D/C tele.  Maintaining NSR. Active Problems:   Severe malnutrition in the context of chronic illness -Seen by dietitian on 07/15/2013. Continue nutritional supplements with Ensure 3 times a day.   Rash -Possibly a allergy to Rocephin. Stop Rocephin. -Benadryl cream as needed.   Hypokalemia -Potassium lower despite potassium added to IV fluids. We'll replete orally and check magnesium.   HYPERLIPIDEMIA -Not currently on therapy for this. Cholesterol 156, triglycerides 239, HDL 34, LDL 74.   HYPERTENSION -Currently being managed with amlodipine and Hyzaar.   Endometrial cancer -Status post optimal debulking 04-24-2013 and first cycle of adjuvant taxol carboplatin given 05-30-2013, which she tolerated extremely poorly (developed agitation and paranoia thought to be secondary to steroids). She has now finished a total of 3 cycles of chemotherapy with her fourth cycle  planned for 08/01/2013 out of a total of 6 planned courses. -Plans to have brachytherapy at end of chemotherapy.   Urinary tract infection -On empiric Rocephin. Change to Cipro given rash. Followup urine cultures.   Abdominal pain -Multifactorial with endometrial cancer status post robotic hysterectomy on 04/24/2013 and UTI contributory.   Code Status: Full. Family Communication: Daughter at the bedside. Disposition Plan: Home when stable.   Medical Consultants:  None.  Other Consultants:  None.  Anti-infectives:  Rocephin 07/14/2013--->  HPI/Subjective: Misty Alvarado is without complaints of chest pain, dyspnea, palpitations.  Abdominal pain improved.  Wants foley out and tele off.  Objective: Filed Vitals:   07/15/13 0400 07/15/13 0556 07/15/13 1024 07/15/13 1343  BP: 116/65 152/79 143/74 137/84  Pulse:   92 86  Temp: 97.7 F (36.5 C) 98 F (36.7 C) 98.1 F (36.7 C) 98.1 F (36.7 C)  TempSrc: Oral Oral Oral Oral  Resp: 13 13 16 16   Height:  5\' 8"  (1.727 m)    Weight: 90.3 kg (199 lb 1.2 oz) 89.6 kg (197 lb 8.5 oz)    SpO2: 94% 100% 96% 98%    Intake/Output Summary (Last 24 hours) at 07/15/13 1346 Last data filed at 07/15/13 1343  Gross per 24 hour  Intake   1800 ml  Output   1975 ml  Net   -175 ml    Exam: Gen:  NAD Cardiovascular:  RRR, No M/R/G Respiratory:  Lungs CTAB Gastrointestinal:  Abdomen soft, NT/ND, + BS Extremities:  No C/E/C  Data Reviewed: Basic Metabolic Panel:  Recent Labs Lab 07/10/13 1257  07/13/13 2025 07/14/13 0930 07/15/13 0905  NA 141  --  136 136 137  K 3.4*  < > 3.4* 3.0* 2.9*  CL  --   --  100 103 103  CO2 26  --  24 23 25   GLUCOSE 107  --  117* 101* 116*  BUN 12.2  --  16 12 8   CREATININE 1.0  --  1.03 0.98 0.88  CALCIUM 9.2  --  8.7 7.6* 7.7*  < > = values in this interval not displayed. GFR Estimated Creatinine Clearance: 62.7 ml/min (by C-G formula based on Cr of 0.88). Liver Function Tests:  Recent  Labs Lab 07/10/13 1257 07/13/13 2025  AST 22 22  ALT 15 15  ALKPHOS 63 66  BILITOT 0.91 0.6  PROT 6.9 6.3  ALBUMIN 3.7 3.5    Recent Labs Lab 07/13/13 2025  LIPASE 29   Coagulation profile  Recent Labs Lab 07/14/13 0057  INR 1.08    CBC:  Recent Labs Lab 07/10/13 1257 07/13/13 2025 07/14/13 0930  WBC 4.1 9.2 6.7  NEUTROABS 2.1  --   --   HGB 11.2* 12.0 9.3*  HCT 31.8* 33.6* 26.3*  MCV 87.1 85.5 87.1  PLT 131* 150 138*   Thyroid function studies  Recent Labs  07/14/13 0057  TSH 2.220   Microbiology Recent Results (from the past 240 hour(s))  URINE CULTURE     Status: None   Collection Time    07/13/13 10:21 PM      Result Value Range Status   Specimen Description URINE, CLEAN CATCH   Final   Special Requests NONE   Final   Culture  Setup Time 07/14/2013 04:42   Final   Colony Count 6,000 COLONIES/ML   Final   Culture INSIGNIFICANT GROWTH   Final   Report Status 07/15/2013 FINAL   Final  CULTURE, BLOOD (ROUTINE X 2)     Status: None   Collection Time    07/14/13 12:57 AM      Result Value Range Status   Specimen Description BLOOD RIGHT HAND   Final   Special Requests BOTTLES DRAWN AEROBIC AND ANAEROBIC 3CC   Final   Culture  Setup Time 07/14/2013 04:25   Final   Culture     Final   Value:        BLOOD CULTURE RECEIVED NO GROWTH TO DATE CULTURE WILL BE HELD FOR 5 DAYS BEFORE ISSUING A FINAL NEGATIVE REPORT   Report Status PENDING   Incomplete  CULTURE, BLOOD (ROUTINE X 2)     Status: None   Collection Time    07/14/13  1:32 AM      Result Value Range Status   Specimen Description BLOOD RIGHT FOREARM   Final   Special Requests BOTTLES DRAWN AEROBIC AND ANAEROBIC 5CC   Final   Culture  Setup Time 07/14/2013 04:24   Final   Culture     Final   Value:        BLOOD CULTURE RECEIVED NO GROWTH TO DATE CULTURE WILL BE HELD FOR 5 DAYS BEFORE ISSUING A FINAL NEGATIVE REPORT   Report Status PENDING   Incomplete  MRSA PCR SCREENING     Status: None    Collection Time    07/14/13  3:08 AM      Result Value Range Status   MRSA by PCR NEGATIVE  NEGATIVE Final   Comment:            The GeneXpert MRSA Assay (FDA     approved for NASAL specimens     only), is one component of a     comprehensive MRSA colonization     surveillance program.  It is not     intended to diagnose MRSA     infection nor to guide or     monitor treatment for     MRSA infections.     Procedures and Diagnostic Studies: Ct Head Wo Contrast  11/01/202014   *RADIOLOGY REPORT*  Clinical Data: Head trauma secondary to a fall.  Abrasion to the left side of forehead.  CT HEAD WITHOUT CONTRAST  Technique:  Contiguous axial images were obtained from the base of the skull through the vertex without contrast.  Comparison: None.  Findings: There is no acute intracranial hemorrhage or mass lesion. There is fairly severe frontal and temporal lobe atrophy.  There is a vague area of lucency low in the right basal ganglia which may represent an old lacunar infarct.  Small areas of lucency in the anterior limbs of both internal capsules consistent with small vessel ischemic disease.  IMPRESSION: No acute intracranial abnormality.  Frontal and temporal lobe atrophy.  Probable old right basal ganglia lacunar infarct.   Original Report Authenticated By: Francene Boyers, M.D.   Ct Abdomen Pelvis W Contrast  07/14/2013   *RADIOLOGY REPORT*  Clinical Data: 79 year old female with abdominal and pelvic pain. Patient on recent chemotherapy for endometrial cancer.  CT ABDOMEN AND PELVIS WITH CONTRAST  Technique:  Multidetector CT imaging of the abdomen and pelvis was performed following the standard protocol during bolus administration of intravenous contrast.  Contrast:  100 ml intravenous Omnipaque-300  Comparison: 03/07/2006 CT of the abdomen  Findings: The liver, gallbladder, spleen, pancreas, and adrenal glands are unremarkable. Mild bilateral renal cortical thinning is noted as well as a punctate  nonobstructing left upper pole renal calculus.  A left renal cyst is also identified.  No free fluid, enlarged lymph nodes, biliary dilation or abdominal aortic aneurysm identified.  There is no evidence of bowel obstruction or pneumoperitoneum. The bowel and bladder are within normal limits. The patient is status post hysterectomy. A small right inguinal hernia containing fat is again noted.  No acute or suspicious bony abnormalities are identified. Degenerative changes of the lumbar spine and hips are identified.  IMPRESSION: No evidence of acute abnormality.  Punctate nonobstructing left upper pole renal calculus.   Original Report Authenticated By: Harmon Pier, M.D.    Scheduled Meds: . ciprofloxacin  400 mg Intravenous BID  . haloperidol  1 mg Oral BID  . rivaroxaban  20 mg Oral Q breakfast   Continuous Infusions: . 0.9 % NaCl with KCl 20 mEq / L 1,000 mL (07/14/13 2225)    Time spent: 25 minutes.   LOS: 2 days   Jenet Durio  [facility] Pager [phone].   *Please note that the hospitalists switch teams on Wednesdays. Please call the flow manager at [phone] if you are having difficulty reaching the hospitalist taking care of this patient as she can update you and provide the most up-to-date pager number of provider caring for the patient. If 8PM-8AM, please contact night-coverage at [email or web address], password [facility]  07/15/2013, 1:46 PM

## 2013-07-15 NOTE — Plan of Care (Signed)
Problem: Phase I Progression Outcomes Goal: Other Phase I Outcomes/Goals Outcome: Not Applicable Date Met:  07/15/13 On 8/2 patient has urinary retention - placed foley to monitor urine output.

## 2013-07-16 LAB — BASIC METABOLIC PANEL
Calcium: 8.3 mg/dL — ABNORMAL LOW (ref 8.4–10.5)
Creatinine, Ser: 0.85 mg/dL (ref 0.50–1.10)
GFR calc Af Amer: 75 mL/min — ABNORMAL LOW (ref >90)
GFR calc non Af Amer: 64 mL/min — ABNORMAL LOW (ref >90)

## 2013-07-16 MED ORDER — MAGNESIUM SULFATE 40 MG/ML IJ SOLN
2.0000 g | Freq: Once | INTRAMUSCULAR | Status: AC
  Administered 2013-07-16: 2 g via INTRAVENOUS
  Filled 2013-07-16: qty 50

## 2013-07-16 NOTE — Evaluation (Signed)
Physical Therapy Evaluation Patient Details Name: KETARA CAVNESS MRN: [id number] DOB: [birth year] Today's Date: 07/16/2013 Time: 9147-8295 PT Time Calculation (min): 25 min  PT Assessment / Plan / Recommendation History of Present Illness  KAMILLAH DIDONATO is a 79 y.o. female with a history of endometrial cancer diagnosed in 02/2013 who is currently undergoing chemotherapy and presents to the ED with complaints of LLQ and pelvic area pain and urinary frequency and urgency.   She was evaluated in the ED and was found to have Atrial fibrillation with RVR at 134, and was placed on a Diltiazem drip, and her laboratory studies revealed an UTI. A urine C+S was sent and she was placed on IV Rocephin.  Pt spontaneously converted to NSR 8/2   Clinical Impression  Pt presents with generalized deconditioning with flat affect and decreased neck and trunk rotation.  She would benefit from OT consult and continued PT to regain functional independence.  Pt lives alone.  Recommend short term SNF    PT Assessment  Patient needs continued PT services    Follow Up Recommendations  SNF    Does the patient have the potential to tolerate intense rehabilitation      Barriers to Discharge Decreased caregiver support      Equipment Recommendations  Rolling walker with 5" wheels    Recommendations for Other Services OT consult   Frequency Min 3X/week    Precautions / Restrictions Precautions Precautions: Fall   Pertinent Vitals/Pain No c/o pain      Mobility  Bed Mobility Bed Mobility: Supine to Sit Supine to Sit: 4: Min assist Transfers Transfers: Sit to Stand;Stand to Sit Sit to Stand: 4: Min assist Details for Transfer Assistance: frequent cues to push up on armrests Ambulation/Gait Ambulation/Gait Assistance: 4: Min assist Ambulation Distance (Feet): 75 Feet Assistive device: Rolling walker Ambulation/Gait Assistance Details: cues for posture and assist for safety Gait Pattern:  Step-through pattern;Trunk flexed;Decreased step length - right;Decreased step length - left;Decreased trunk rotation;Left flexed knee in stance;Right flexed knee in stance;Shuffle Gait velocity: decreased General Gait Details: pt maintains trunk flexed at hips and has difficulty extending hips even with cues.  Pt keeps head and body in center alignment    Exercises General Exercises - Lower Extremity Ankle Circles/Pumps: AROM;Both;10 reps;Supine Gluteal Sets: AROM;Both;5 reps;Standing Long Arc Quad: AROM;Both;5 reps;Seated Hip Flexion/Marching: AROM;Both;5 reps;Standing Other Exercises Other Exercises: repeated sit to stand x 5 reps   PT Diagnosis: Difficulty walking;Generalized weakness  PT Problem List: Decreased strength;Decreased activity tolerance;Decreased mobility;Decreased safety awareness;Obesity PT Treatment Interventions: Gait training;Functional mobility training;Therapeutic activities;Therapeutic exercise;Balance training;Patient/family education     PT Goals(Current goals can be found in the care plan section) Acute Rehab PT Goals Patient Stated Goal: I know I need to walk more PT Goal Formulation: With patient Time For Goal Achievement: 07/30/13 Potential to Achieve Goals: Good  Visit Information  Last PT Received On: 07/16/13 Assistance Needed: +1 History of Present Illness: DEANDA RUDDELL is a 79 y.o. female with a history of endometrial cancer diagnosed in 02/2013 who is currently undergoing chemotherapy and presents to the ED with complaints of LLQ and pelvic area pain and urinary frequency and urgency.   She was evaluated in the ED and was found to have Atrial fibrillation with RVR at 134, and was placed on a Diltiazem drip, and her laboratory studies revealed an UTI. A urine C+S was sent and she was placed on IV Rocephin.  Pt spontaneously converted to NSR 8/2  Prior Functioning  Home Living Family/patient expects to be discharged to:: Private  residence Living Arrangements: Alone Available Help at Discharge: Family;Available PRN/intermittently (daughter works) Type of Home: House Home Layout: Able to live on main level with bedroom/bathroom Additional Comments: maybe had a walker, can't remember Prior Function Level of Independence: Independent Communication Communication: No difficulties    Cognition  Cognition Arousal/Alertness: Awake/alert Behavior During Therapy: WFL for tasks assessed/performed;Flat affect Overall Cognitive Status: Within Functional Limits for tasks assessed    Extremity/Trunk Assessment Lower Extremity Assessment Lower Extremity Assessment: Generalized weakness;Overall WFL for tasks assessed   Balance Balance Balance Assessed: Yes Static Sitting Balance Static Sitting - Balance Support: No upper extremity supported;Feet supported Static Sitting - Level of Assistance: 5: Stand by assistance Static Standing Balance Static Standing - Balance Support: Bilateral upper extremity supported;During functional activity Static Standing - Level of Assistance: 5: Stand by assistance  End of Session PT - End of Session Equipment Utilized During Treatment: Gait belt Activity Tolerance: Patient tolerated treatment well Patient left: in chair;with call bell/phone within reach  GP Rosey Bath K. Manson Passey, [place] [phone] 07/16/2013, 4:35 PM

## 2013-07-16 NOTE — Progress Notes (Signed)
[facility] PROGRESS NOTE  Misty Alvarado ZOX:[id number] DOB: 06-01-1933 DOA: 07/13/2013 PCP: Rogelia Boga, MD  Brief narrative: Misty Alvarado is an 79 y.o. female with a PMH of endometrial cancer diagnosed 02/2013 who is currently undergoing chemotherapy was admitted on 07/14/2013 with a chief complaint of left lower quadrant/pelvic pain, urinary frequency/urgency. Upon initial evaluation emergency department, the patient was found to be in atrial fibrillation with rapid ventricular response.  She spontaneously converted to normal sinus rhythm overnight 07/14/2013.  Assessment/Plan: Principal Problem:   Atrial fibrillation with RVR -Admitted to the step down unit and placed on a Cardizem drip. -Heparin per pharmacy consult ordered.  ChA2DS2-VASc score = 4, with a projected stroke rate risk of 4% per year so we'll need chronic anticoagulation. The risks and benefits of chronic anticoagulation were fully discussed with the patient and all of her questions were answered.  Now on Xarelto. -Maintaining NSR. Active Problems:   Severe malnutrition in the context of chronic illness -Seen by dietitian on 07/15/2013. Continue nutritional supplements with Ensure 3 times a day.   Rash -Possibly from Rocephin, which has been stopped. -Benadryl cream as needed.   Hypokalemia / hypomagnesemia -Potassium corrected.  -Magnesium low, will give 4 grams IV.   HYPERLIPIDEMIA -Not currently on therapy for this. Cholesterol 156, triglycerides 239, HDL 34, LDL 74.   HYPERTENSION -Currently being managed with amlodipine and Hyzaar.   Endometrial cancer -Status post optimal debulking 04-24-2013 and first cycle of adjuvant taxol carboplatin given 05-30-2013, which she tolerated extremely poorly (developed agitation and paranoia thought to be secondary to steroids). She has now finished a total of 3 cycles of chemotherapy with her fourth cycle planned for 08/01/2013 out of a total of 6 planned  courses. -Plans to have brachytherapy at end of chemotherapy.   Urinary tract infection -Urine cultures negative, d/c antibiotics.   Abdominal pain -Multifactorial with endometrial cancer status post robotic hysterectomy on 04/24/2013 and UTI contributory.   Code Status: Full. Family Communication: No family at the bedside. Disposition Plan: Home when stable.   Medical Consultants:  None.  Other Consultants:  None.  Anti-infectives:  Rocephin 07/14/2013--->07/15/13  Cipro 07/15/13--->07/16/13  HPI/Subjective: Misty Alvarado is without complaints of chest pain, dyspnea, palpitations.  Abdominal pain improved.  Feels better than when she came in.  Objective: Filed Vitals:   07/15/13 2209 07/16/13 0140 07/16/13 0522 07/16/13 1021  BP: 144/73 122/62 163/74 142/69  Pulse: 89 86 92 86  Temp: 98.2 F (36.8 C) 97.8 F (36.6 C) 97.8 F (36.6 C) 97.9 F (36.6 C)  TempSrc: Oral Oral Oral Oral  Resp: 18 18 18 18   Height:      Weight:      SpO2: 98% 100% 99% 98%    Intake/Output Summary (Last 24 hours) at 07/16/13 1141 Last data filed at 07/16/13 1049  Gross per 24 hour  Intake   2200 ml  Output   1925 ml  Net    275 ml    Exam: Gen:  NAD Cardiovascular:  RRR, No M/R/G Respiratory:  Lungs CTAB Gastrointestinal:  Abdomen soft, NT/ND, + BS Extremities:  No C/E/C  Data Reviewed: Basic Metabolic Panel:  Recent Labs Lab 07/10/13 1257  07/13/13 2025 07/14/13 0930 07/15/13 0905 07/16/13 0450  NA 141  --  136 136 137 139  K 3.4*  < > 3.4* 3.0* 2.9* 3.6  CL  --   --  100 103 103 104  CO2 26  --  24 23 25  28  GLUCOSE 107  --  117* 101* 116* 105*  BUN 12.2  --  16 12 8 7   CREATININE 1.0  --  1.03 0.98 0.88 0.85  CALCIUM 9.2  --  8.7 7.6* 7.7* 8.3*  MG  --   --   --   --  1.2*  --   < > = values in this interval not displayed. GFR Estimated Creatinine Clearance: 64.9 ml/min (by C-G formula based on Cr of 0.85). Liver Function Tests:  Recent Labs Lab  07/10/13 1257 07/13/13 2025  AST 22 22  ALT 15 15  ALKPHOS 63 66  BILITOT 0.91 0.6  PROT 6.9 6.3  ALBUMIN 3.7 3.5    Recent Labs Lab 07/13/13 2025  LIPASE 29   Coagulation profile  Recent Labs Lab 07/14/13 0057  INR 1.08    CBC:  Recent Labs Lab 07/10/13 1257 07/13/13 2025 07/14/13 0930  WBC 4.1 9.2 6.7  NEUTROABS 2.1  --   --   HGB 11.2* 12.0 9.3*  HCT 31.8* 33.6* 26.3*  MCV 87.1 85.5 87.1  PLT 131* 150 138*   Thyroid function studies  Recent Labs  07/14/13 0057  TSH 2.220   Microbiology Recent Results (from the past 240 hour(s))  URINE CULTURE     Status: None   Collection Time    07/13/13 10:21 PM      Result Value Range Status   Specimen Description URINE, CLEAN CATCH   Final   Special Requests NONE   Final   Culture  Setup Time 07/14/2013 04:42   Final   Colony Count 6,000 COLONIES/ML   Final   Culture INSIGNIFICANT GROWTH   Final   Report Status 07/15/2013 FINAL   Final  CULTURE, BLOOD (ROUTINE X 2)     Status: None   Collection Time    07/14/13 12:57 AM      Result Value Range Status   Specimen Description BLOOD RIGHT HAND   Final   Special Requests BOTTLES DRAWN AEROBIC AND ANAEROBIC 3CC   Final   Culture  Setup Time 07/14/2013 04:25   Final   Culture     Final   Value:        BLOOD CULTURE RECEIVED NO GROWTH TO DATE CULTURE WILL BE HELD FOR 5 DAYS BEFORE ISSUING A FINAL NEGATIVE REPORT   Report Status PENDING   Incomplete  CULTURE, BLOOD (ROUTINE X 2)     Status: None   Collection Time    07/14/13  1:32 AM      Result Value Range Status   Specimen Description BLOOD RIGHT FOREARM   Final   Special Requests BOTTLES DRAWN AEROBIC AND ANAEROBIC 5CC   Final   Culture  Setup Time 07/14/2013 04:24   Final   Culture     Final   Value:        BLOOD CULTURE RECEIVED NO GROWTH TO DATE CULTURE WILL BE HELD FOR 5 DAYS BEFORE ISSUING A FINAL NEGATIVE REPORT   Report Status PENDING   Incomplete  MRSA PCR SCREENING     Status: None   Collection  Time    07/14/13  3:08 AM      Result Value Range Status   MRSA by PCR NEGATIVE  NEGATIVE Final   Comment:            The GeneXpert MRSA Assay (FDA     approved for NASAL specimens     only), is one component of a  comprehensive MRSA colonization     surveillance program. It is not     intended to diagnose MRSA     infection nor to guide or     monitor treatment for     MRSA infections.     Procedures and Diagnostic Studies: Ct Head Wo Contrast  07/11/2013   *RADIOLOGY REPORT*  Clinical Data: Head trauma secondary to a fall.  Abrasion to the left side of forehead.  CT HEAD WITHOUT CONTRAST  Technique:  Contiguous axial images were obtained from the base of the skull through the vertex without contrast.  Comparison: None.  Findings: There is no acute intracranial hemorrhage or mass lesion. There is fairly severe frontal and temporal lobe atrophy.  There is a vague area of lucency low in the right basal ganglia which may represent an old lacunar infarct.  Small areas of lucency in the anterior limbs of both internal capsules consistent with small vessel ischemic disease.  IMPRESSION: No acute intracranial abnormality.  Frontal and temporal lobe atrophy.  Probable old right basal ganglia lacunar infarct.   Original Report Authenticated By: Francene Boyers, M.D.   Ct Abdomen Pelvis W Contrast  07/14/2013   *RADIOLOGY REPORT*  Clinical Data: 79 year old female with abdominal and pelvic pain. Patient on recent chemotherapy for endometrial cancer.  CT ABDOMEN AND PELVIS WITH CONTRAST  Technique:  Multidetector CT imaging of the abdomen and pelvis was performed following the standard protocol during bolus administration of intravenous contrast.  Contrast:  100 ml intravenous Omnipaque-300  Comparison: 03/07/2006 CT of the abdomen  Findings: The liver, gallbladder, spleen, pancreas, and adrenal glands are unremarkable. Mild bilateral renal cortical thinning is noted as well as a punctate nonobstructing  left upper pole renal calculus.  A left renal cyst is also identified.  No free fluid, enlarged lymph nodes, biliary dilation or abdominal aortic aneurysm identified.  There is no evidence of bowel obstruction or pneumoperitoneum. The bowel and bladder are within normal limits. The patient is status post hysterectomy. A small right inguinal hernia containing fat is again noted.  No acute or suspicious bony abnormalities are identified. Degenerative changes of the lumbar spine and hips are identified.  IMPRESSION: No evidence of acute abnormality.  Punctate nonobstructing left upper pole renal calculus.   Original Report Authenticated By: Harmon Pier, M.D.    Scheduled Meds: . ciprofloxacin  400 mg Intravenous BID  . feeding supplement  237 mL Oral TID BM  . haloperidol  1 mg Oral BID  . rivaroxaban  20 mg Oral Q breakfast   Continuous Infusions: . 0.9 % NaCl with KCl 20 mEq / L 75 mL/hr at 07/16/13 0728    Time spent: 25 minutes.   LOS: 3 days   Sakia Schrimpf  [facility] Pager [phone].   *Please note that the hospitalists switch teams on Wednesdays. Please call the flow manager at [phone] if you are having difficulty reaching the hospitalist taking care of this patient as she can update you and provide the most up-to-date pager number of provider caring for the patient. If 8PM-8AM, please contact night-coverage at [email or web address], password [facility]  07/16/2013, 11:41 AM

## 2013-07-17 ENCOUNTER — Ambulatory Visit: Payer: Medicare Other | Admitting: Gynecology

## 2013-07-17 DIAGNOSIS — D61810 Antineoplastic chemotherapy induced pancytopenia: Secondary | ICD-10-CM | POA: Diagnosis present

## 2013-07-17 LAB — MAGNESIUM: Magnesium: 1.6 mg/dL (ref 1.5–2.5)

## 2013-07-17 LAB — BASIC METABOLIC PANEL
Chloride: 103 mEq/L (ref 96–112)
GFR calc Af Amer: >90 mL/min (ref >90)
Potassium: 3.9 mEq/L (ref 3.5–5.1)

## 2013-07-17 LAB — CBC
Platelets: 145 10*3/uL — ABNORMAL LOW (ref 150–400)
RDW: 14.5 % (ref 11.5–15.5)
WBC: 1.3 10*3/uL — CL (ref 4.0–10.5)

## 2013-07-17 MED ORDER — RIVAROXABAN 20 MG PO TABS: 20.0000 mg | ORAL_TABLET | Freq: Every day | ORAL | Status: DC

## 2013-07-17 NOTE — Discharge Summary (Signed)
Physician Discharge Summary  Misty Alvarado:[id number] DOB: 04-01-1933 DOA: 07/13/2013  PCP: Rogelia Boga, MD  Admit date: 07/13/2013 Discharge date: 07/17/2013  Recommendations for Outpatient Follow-up:  1. The patient was started on Xarelto for treatment of non-valvular PAF;  CBC and creatinine should be periodically monitored while on this medicine.  Aspirin was discontinued and the patient was advised to avoid NSAIDS. 2. Home health PT was set up. 3. Triglycerides 239 on FLP.  Consider treating. 4. Recommend close follow up of chemotherapy induced pancytopenia. 5. F/U final blood culture results (negative to date).  Discharge Diagnoses:  Principal Problem:    Atrial fibrillation with RVR Active Problems:    HYPERLIPIDEMIA    HYPERTENSION    Endometrial cancer    Urinary tract infection    Abdominal pain    Hypokalemia    Rash and nonspecific skin eruption    Protein-calorie malnutrition, severe    Antineoplastic chemotherapy induced pancytopenia   Discharge Condition: Improved.  Diet recommendation: Low sodium, heart healthy.  History of present illness:  Misty Alvarado is an 79 y.o. female with a PMH of endometrial cancer diagnosed 02/2013 who is currently undergoing chemotherapy was admitted on 07/14/2013 with a chief complaint of left lower quadrant/pelvic pain, urinary frequency/urgency. Upon initial evaluation emergency department, the patient was found to be in atrial fibrillation with rapid ventricular response. She spontaneously converted to normal sinus rhythm overnight 07/14/2013.  Hospital Course by problem:  Principal Problem:  Atrial fibrillation with RVR  -Admitted to the step down unit and placed on a Cardizem drip.  -Heparin per pharmacy consult ordered. ChA2DS2-VASc score = 4, with a projected stroke rate risk of 4% per year so was felt to need chronic anticoagulation. The risks and benefits of chronic anticoagulation were fully  discussed with the patient and all of her questions were answered. Now on Xarelto.  -Maintaining NSR prior to discharge.  Active Problems:  Severe malnutrition in the context of chronic illness  -Seen by dietitian on 07/15/2013. Continue nutritional supplements with Ensure 3 times a day.  Rash  -Possibly from Rocephin, which has been stopped.  -Benadryl cream as needed was provided until rash resolved.  Hypokalemia / hypomagnesemia  -Potassium corrected.  -Magnesium low, corrected status post 4 grams IV given 07/16/2013.  HYPERLIPIDEMIA  -Not currently on therapy for this. Cholesterol 156, triglycerides 239, HDL 34, LDL 74.  HYPERTENSION  -Currently being managed with amlodipine and Hyzaar.  Endometrial cancer  -Status post optimal debulking 04-24-2013 and first cycle of adjuvant taxol carboplatin given 05-30-2013, which she tolerated extremely poorly (developed agitation and paranoia thought to be secondary to steroids). She has now finished a total of 3 cycles of chemotherapy with her fourth cycle planned for 08/01/2013 out of a total of 6 planned courses.  -Plans to have brachytherapy at end of chemotherapy.  Urinary tract infection  -Urine cultures negative, initially treated with empiric Rocephin which discontinued.  Abdominal pain  -Unclear etiology. No evidence of UTI. Improving with conservative care.  Pancytopenia  -Secondary to bone marrow suppression from recent chemotherapy. Recommend close monitoring of blood counts.   Discharge Exam: Filed Vitals:   07/17/13 1058  BP: 155/83  Pulse: 96  Temp: 98 F (36.7 C)  Resp: 20   Filed Vitals:   07/16/13 2044 07/17/13 0230 07/17/13 0428 07/17/13 1058  BP: 163/82 156/87 164/89 155/83  Pulse: 94 85 87 96  Temp: 98.3 F (36.8 C) 98.2 F (36.8 C) 98.3 F (36.8 C) 98 F (  36.7 C)  TempSrc: Oral Oral Oral Oral  Resp: 18 18 18 20   Height:      Weight:      SpO2: 96% 97% 99% 96%    Gen:  NAD Cardiovascular:  RRR, No  M/R/G Respiratory: Lungs CTAB Gastrointestinal: Abdomen soft, NT/ND with normal active bowel sounds. Extremities: No C/E/C   Discharge Instructions  Discharge Orders   Future Appointments Provider Department Dept Phone   08/01/2013 9:00 AM Mauri [place] [facility] [phone]   08/01/2013 9:30 AM [facility] [address] [place] [facility] [phone]   08/01/2013 10:30 AM [facility] [address] [place] [facility] [phone]   08/02/2013 1:00 PM [facility] [place] [facility] [phone]   08/10/2013 11:00 AM Jeannette Corpus, MD [facility] G[facility]OLOG[facility] ONCOL[facility] [phone]   09/07/2013 10:45 AM Jeannette Corpus, MD [facility] [phone]   Future Orders Complete By Expires     Call MD for:  persistant nausea and vomiting  As directed     Call MD for:  severe uncontrolled pain  As directed     Call MD for:  temperature >100.4  As directed     Diet - low sodium heart healthy  As directed     Discharge instructions  As directed     Comments:      You were started on a blood thinner called Xarelto to reduce your risk of stroke in the setting of having an intermittent irregular heart beat.  Avoid aspirin or NSAIDS (OTC pain relievers except tylenol) which can increase your risk of bleeding.  Please advise all of your treating physician's that you were started on this medication.  You were cared for by Dr. Hillery Aldo  (a hospitalist) during your hospital stay. If you have any questions about your discharge medications or the care you received while you were in the hospital after you are discharged, you can call the unit and ask to speak with the hospitalist on call if the hospitalist that took care of you is not available. Once you are discharged, your primary care physician will handle any further medical issues.  Please note that NO REFILLS for any discharge medications will be authorized once you are discharged, as it is imperative that you return to your primary care physician (or establish a relationship with a primary care physician if you do not have one) for your aftercare needs so that they can reassess your need for medications and monitor your lab values.  Any outstanding tests can be reviewed by your PCP at your follow up visit.  It is also important to review any medicine changes with your PCP.  Please bring these d/c instructions with you to your next visit so your physician can review these changes with you.  If you do not have a primary care physician, you can call [phone] for a physician referral.  It is highly recommended that you obtain a PCP for hospital follow up.    Face-to-face encounter (required for Medicare/Medicaid patients)  As directed     Comments:      I RAMA,CHRISTINA certify that this patient is under my care and that I, or a nurse practitioner or physician's assistant working with me, had a face-to-face encounter that meets the physician face-to-face encounter requirements with this patient on 07/17/2013. The encounter with the patient was in whole, or in part for the following  medical condition(s) which is the primary reason for home health care (List medical condition): endometrial cancer diagnosed 02/2013 who is currently undergoing chemotherapy and who is deconditioned from cancer treatment & needs PT for strength / endurance training.    Questions:      The encounter with the patient was in whole, or in part, for the following medical condition, which is the primary reason for home health care:  Deconditioning secondary to treatment of cancer s/p chemo    I certify that, based on my findings, the following services are medically necessary home health services:  Physical therapy    My clinical findings support the need for the above services:  Unable to leave home safely without  assistance and/or assistive device    Further, I certify that my clinical findings support that this patient is homebound due to:  Unable to leave home safely without assistance    Reason for Medically Necessary Home Health Services:  Therapy- Therapeutic Exercises to Increase Strength and Endurance    Home Health  As directed     Questions:      To provide the following care/treatments:  PT    Increase activity slowly  As directed     Walk with assistance  As directed     Walker   As directed         Medication List    STOP taking these medications       aspirin 81 MG tablet      TAKE these medications       amLODipine 5 MG tablet  Commonly known as:  NORVASC  Take 5 mg by mouth every evening.     haloperidol 1 MG tablet  Commonly known as:  HALDOL  Take 1 mg by mouth 2 (two) times daily. 8:00 am and 8:00 pm     HYDROcodone-acetaminophen 10-325 MG per tablet  Commonly known as:  NORCO  Take 1 tablet by mouth every 6 (six) hours as needed for pain.     hydrocortisone-pramoxine rectal foam  Commonly known as:  PROCTOFOAM-HC  Place 1 applicator rectally 2 (two) times daily as needed for hemorrhoids.     LORazepam 0.5 MG tablet  Commonly known as:  ATIVAN  Take 1-2 tabs  under the tongue or swallow  Every 6 hrs. As needed for nausea.  Will make you drowsy     losartan-hydrochlorothiazide 100-12.5 MG per tablet  Commonly known as:  HYZAAR  Take 1 tablet by mouth every morning.     mometasone 50 MCG/ACT nasal spray  Commonly known as:  NASONEX  Place 2 sprays into the nose daily as needed (for allergies).     ondansetron 8 MG tablet  Commonly known as:  ZOFRAN  Take 1-2 tablets (8-16 mg total) by mouth every 12 (twelve) hours as needed for nausea (Will not make you drowsy).     Rivaroxaban 20 MG Tabs tablet  Commonly known as:  XARELTO  Take 1 tablet (20 mg total) by mouth daily with breakfast.           Follow-up Information   Follow up with Rogelia Boga, MD. Schedule an appointment as soon as possible for a visit in 1 week.   Contact information:   [address] [place] Flake [place] [address] [phone]        The results of significant diagnostics from this hospitalization (including imaging, microbiology, ancillary and laboratory) are listed below for reference.    Significant Diagnostic Studies: Ct  Head Wo Contrast  07/11/2013   *RADIOLOGY REPORT*  Clinical Data: Head trauma secondary to a fall.  Abrasion to the left side of forehead.  CT HEAD WITHOUT CONTRAST  Technique:  Contiguous axial images were obtained from the base of the skull through the vertex without contrast.  Comparison: None.  Findings: There is no acute intracranial hemorrhage or mass lesion. There is fairly severe frontal and temporal lobe atrophy.  There is a vague area of lucency low in the right basal ganglia which may represent an old lacunar infarct.  Small areas of lucency in the anterior limbs of both internal capsules consistent with small vessel ischemic disease.  IMPRESSION: No acute intracranial abnormality.  Frontal and temporal lobe atrophy.  Probable old right basal ganglia lacunar infarct.   Original Report Authenticated By: Francene Boyers, M.D.   Ct Abdomen Pelvis W Contrast  07/14/2013   *RADIOLOGY REPORT*  Clinical Data: 79 year old female with abdominal and pelvic pain. Patient on recent chemotherapy for endometrial cancer.  CT ABDOMEN AND PELVIS WITH CONTRAST  Technique:  Multidetector CT imaging of the abdomen and pelvis was performed following the standard protocol during bolus administration of intravenous contrast.  Contrast:  100 ml intravenous Omnipaque-300  Comparison: 03/07/2006 CT of the abdomen  Findings: The liver, gallbladder, spleen, pancreas, and adrenal glands are unremarkable. Mild bilateral renal cortical thinning is noted as well as a punctate nonobstructing left upper pole renal calculus.  A left renal cyst is also identified.  No  free fluid, enlarged lymph nodes, biliary dilation or abdominal aortic aneurysm identified.  There is no evidence of bowel obstruction or pneumoperitoneum. The bowel and bladder are within normal limits. The patient is status post hysterectomy. A small right inguinal hernia containing fat is again noted.  No acute or suspicious bony abnormalities are identified. Degenerative changes of the lumbar spine and hips are identified.  IMPRESSION: No evidence of acute abnormality.  Punctate nonobstructing left upper pole renal calculus.   Original Report Authenticated By: Harmon Pier, M.D.   Dg Chest Port 1 View  Mar 30, 202014   *RADIOLOGY REPORT*  Clinical Data: Generalized fatigue and weakness with fever.  1 day post chemotherapy for endometrial carcinoma.  PORTABLE CHEST - 1 VIEW  Comparison: 04/20/2013  Findings: The shallow inspiration.  Heart size and pulmonary vascularity are normal.  Peribronchial thickening and perihilar streaky opacities consistent with chronic bronchitis.  No blunting of costophrenic angles.  No pneumothorax.  No focal consolidation. Surgical clip in the base of the neck.  Degenerative changes in the spine and shoulders.  No significant change in the appearance of chest since previous study.  IMPRESSION: Shallow inspiration with chronic bronchitic changes.  No evidence of active pulmonary disease.   Original Report Authenticated By: Burman Nieves, M.D.    Labs:  Basic Metabolic Panel:  Recent Labs Lab 07/13/13 2025 07/14/13 0930 07/15/13 0905 07/16/13 0450 07/17/13 0400  NA 136 136 137 139 137  K 3.4* 3.0* 2.9* 3.6 3.9  CL 100 103 103 104 103  CO2 24 23 25 28 27   GLUCOSE 117* 101* 116* 105* 100*  BUN 16 12 8 7 7   CREATININE 1.03 0.98 0.88 0.85 0.79  CALCIUM 8.7 7.6* 7.7* 8.3* 8.6  MG  --   --  1.2*  --  1.6   GFR Estimated Creatinine Clearance: 69 ml/min (by C-G formula based on Cr of 0.79). Liver Function Tests:  Recent Labs Lab 07/10/13 1257 07/13/13 2025  AST  22 22  ALT 15  15  ALKPHOS 63 66  BILITOT 0.91 0.6  PROT 6.9 6.3  ALBUMIN 3.7 3.5    Recent Labs Lab 07/13/13 2025  LIPASE 29   Coagulation profile  Recent Labs Lab 07/14/13 0057  INR 1.08    CBC:  Recent Labs Lab 07/10/13 1257 07/13/13 2025 07/14/13 0930 07/17/13 0400  WBC 4.1 9.2 6.7 1.3*  NEUTROABS 2.1  --   --   --   HGB 11.2* 12.0 9.3* 8.5*  HCT 31.8* 33.6* 26.3* 24.4*  MCV 87.1 85.5 87.1 87.8  PLT 131* 150 138* 145*   Microbiology Recent Results (from the past 240 hour(s))  URINE CULTURE     Status: None   Collection Time    07/13/13 10:21 PM      Result Value Range Status   Specimen Description URINE, CLEAN CATCH   Final   Special Requests NONE   Final   Culture  Setup Time 07/14/2013 04:42   Final   Colony Count 6,000 COLONIES/ML   Final   Culture INSIGNIFICANT GROWTH   Final   Report Status 07/15/2013 FINAL   Final  CULTURE, BLOOD (ROUTINE X 2)     Status: None   Collection Time    07/14/13 12:57 AM      Result Value Range Status   Specimen Description BLOOD RIGHT HAND   Final   Special Requests BOTTLES DRAWN AEROBIC AND ANAEROBIC 3CC   Final   Culture  Setup Time 07/14/2013 04:25   Final   Culture     Final   Value:        BLOOD CULTURE RECEIVED NO GROWTH TO DATE CULTURE WILL BE HELD FOR 5 DAYS BEFORE ISSUING A FINAL NEGATIVE REPORT   Report Status PENDING   Incomplete  CULTURE, BLOOD (ROUTINE X 2)     Status: None   Collection Time    07/14/13  1:32 AM      Result Value Range Status   Specimen Description BLOOD RIGHT FOREARM   Final   Special Requests BOTTLES DRAWN AEROBIC AND ANAEROBIC 5CC   Final   Culture  Setup Time 07/14/2013 04:24   Final   Culture     Final   Value:        BLOOD CULTURE RECEIVED NO GROWTH TO DATE CULTURE WILL BE HELD FOR 5 DAYS BEFORE ISSUING A FINAL NEGATIVE REPORT   Report Status PENDING   Incomplete  MRSA PCR SCREENING     Status: None   Collection Time    07/14/13  3:08 AM      Result Value Range Status    MRSA by PCR NEGATIVE  NEGATIVE Final   Comment:            The GeneXpert MRSA Assay (FDA     approved for NASAL specimens     only), is one component of a     comprehensive MRSA colonization     surveillance program. It is not     intended to diagnose MRSA     infection nor to guide or     monitor treatment for     MRSA infections.    Time coordinating discharge: 35 minutes.  Signed:  RAMA,CHRISTINA  Pager [phone] [facility] 07/17/2013, 12:21 PM

## 2013-07-17 NOTE — Progress Notes (Signed)
critCRITICAL VALUE ALERT  Critical value received:wbc 1.3  Date of notification:  07/17/13  Time of notification:  0530  Critical value read back:yes  Nurse who received alert:  Joya Gaskins RN  MD notified (1st page): K.Schorr  Time of first page:  0532  MD notified (2nd page):  Time of second page:  Responding MD:  K.Schorr  Time MD responded: [phone]

## 2013-07-17 NOTE — Progress Notes (Addendum)
[facility] PROGRESS NOTE  Misty Alvarado:[id number] DOB: 10-Dec-1933 DOA: 07/13/2013 PCP: Rogelia Boga, MD  Brief narrative: Misty Alvarado is an 79 y.o. female with a PMH of endometrial cancer diagnosed 02/2013 who is currently undergoing chemotherapy was admitted on 07/14/2013 with a chief complaint of left lower quadrant/pelvic pain, urinary frequency/urgency. Upon initial evaluation emergency department, the patient was found to be in atrial fibrillation with rapid ventricular response.  She spontaneously converted to normal sinus rhythm overnight 07/14/2013.  Assessment/Plan: Principal Problem:   Atrial fibrillation with RVR -Admitted to the step down unit and placed on a Cardizem drip. -Heparin per pharmacy consult ordered.  ChA2DS2-VASc score = 4, with a projected stroke rate risk of 4% per year so we'll need chronic anticoagulation. The risks and benefits of chronic anticoagulation were fully discussed with the patient and all of her questions were answered.  Now on Xarelto. -Maintaining NSR. Active Problems:   Severe malnutrition in the context of chronic illness -Seen by dietitian on 07/15/2013. Continue nutritional supplements with Ensure 3 times a day.   Rash -Possibly from Rocephin, which has been stopped. -Benadryl cream as needed.   Hypokalemia / hypomagnesemia -Potassium corrected.  -Magnesium low, corrected status post 4 grams IV given 07/16/2013.   HYPERLIPIDEMIA -Not currently on therapy for this. Cholesterol 156, triglycerides 239, HDL 34, LDL 74.   HYPERTENSION -Currently being managed with amlodipine and Hyzaar.   Endometrial cancer -Status post optimal debulking 04-24-2013 and first cycle of adjuvant taxol carboplatin given 05-30-2013, which she tolerated extremely poorly (developed agitation and paranoia thought to be secondary to steroids). She has now finished a total of 3 cycles of chemotherapy with her fourth cycle planned for 08/01/2013 out of a  total of 6 planned courses. -Plans to have brachytherapy at end of chemotherapy.   Urinary tract infection -Urine cultures negative, d/c antibiotics.   Abdominal pain -Unclear etiology. No evidence of UTI. Improving with conservative care.   Pancytopenia -Secondary to bone marrow suppression from recent chemotherapy. Monitor blood counts.  Code Status: Full. Family Communication: Daughter by telephone 07/17/13. Disposition Plan: Home when stable.   Medical Consultants:  None.  Other Consultants:  None.  Anti-infectives:  Rocephin 07/14/2013--->07/15/13  Cipro 07/15/13--->07/16/13  HPI/Subjective: Misty Alvarado is without complaints of chest pain, dyspnea, palpitations.  Abdominal pain improved.  Feels better than when she came in.  Objective: Filed Vitals:   07/16/13 1330 07/16/13 2044 07/17/13 0230 07/17/13 0428  BP: 153/78 163/82 156/87 164/89  Pulse: 94 94 85 87  Temp: 98.3 F (36.8 C) 98.3 F (36.8 C) 98.2 F (36.8 C) 98.3 F (36.8 C)  TempSrc: Oral Oral Oral Oral  Resp: 18 18 18 18   Height:      Weight:      SpO2: 98% 96% 97% 99%    Intake/Output Summary (Last 24 hours) at 07/17/13 0820 Last data filed at 07/17/13 0809  Gross per 24 hour  Intake 1831.25 ml  Output   3025 ml  Net -1193.75 ml    Exam: Gen:  NAD Cardiovascular:  RRR, No M/R/G Respiratory:  Lungs CTAB Gastrointestinal:  Abdomen soft, NT/ND, + BS Extremities:  No C/E/C  Data Reviewed: Basic Metabolic Panel:  Recent Labs Lab 07/13/13 2025 07/14/13 0930 07/15/13 0905 07/16/13 0450 07/17/13 0400  NA 136 136 137 139 137  K 3.4* 3.0* 2.9* 3.6 3.9  CL 100 103 103 104 103  CO2 24 23 25 28 27   GLUCOSE 117* 101* 116* 105* 100*  BUN 16 12 8 7 7   CREATININE 1.03 0.98 0.88 0.85 0.79  CALCIUM 8.7 7.6* 7.7* 8.3* 8.6  MG  --   --  1.2*  --  1.6   GFR Estimated Creatinine Clearance: 69 ml/min (by C-G formula based on Cr of 0.79). Liver Function Tests:  Recent Labs Lab 07/10/13 1257  07/13/13 2025  AST 22 22  ALT 15 15  ALKPHOS 63 66  BILITOT 0.91 0.6  PROT 6.9 6.3  ALBUMIN 3.7 3.5    Recent Labs Lab 07/13/13 2025  LIPASE 29   Coagulation profile  Recent Labs Lab 07/14/13 0057  INR 1.08    CBC:  Recent Labs Lab 07/10/13 1257 07/13/13 2025 07/14/13 0930 07/17/13 0400  WBC 4.1 9.2 6.7 1.3*  NEUTROABS 2.1  --   --   --   HGB 11.2* 12.0 9.3* 8.5*  HCT 31.8* 33.6* 26.3* 24.4*  MCV 87.1 85.5 87.1 87.8  PLT 131* 150 138* 145*   Microbiology Recent Results (from the past 240 hour(s))  URINE CULTURE     Status: None   Collection Time    07/13/13 10:21 PM      Result Value Range Status   Specimen Description URINE, CLEAN CATCH   Final   Special Requests NONE   Final   Culture  Setup Time 07/14/2013 04:42   Final   Colony Count 6,000 COLONIES/ML   Final   Culture INSIGNIFICANT GROWTH   Final   Report Status 07/15/2013 FINAL   Final  CULTURE, BLOOD (ROUTINE X 2)     Status: None   Collection Time    07/14/13 12:57 AM      Result Value Range Status   Specimen Description BLOOD RIGHT HAND   Final   Special Requests BOTTLES DRAWN AEROBIC AND ANAEROBIC 3CC   Final   Culture  Setup Time 07/14/2013 04:25   Final   Culture     Final   Value:        BLOOD CULTURE RECEIVED NO GROWTH TO DATE CULTURE WILL BE HELD FOR 5 DAYS BEFORE ISSUING A FINAL NEGATIVE REPORT   Report Status PENDING   Incomplete  CULTURE, BLOOD (ROUTINE X 2)     Status: None   Collection Time    07/14/13  1:32 AM      Result Value Range Status   Specimen Description BLOOD RIGHT FOREARM   Final   Special Requests BOTTLES DRAWN AEROBIC AND ANAEROBIC 5CC   Final   Culture  Setup Time 07/14/2013 04:24   Final   Culture     Final   Value:        BLOOD CULTURE RECEIVED NO GROWTH TO DATE CULTURE WILL BE HELD FOR 5 DAYS BEFORE ISSUING A FINAL NEGATIVE REPORT   Report Status PENDING   Incomplete  MRSA PCR SCREENING     Status: None   Collection Time    07/14/13  3:08 AM      Result  Value Range Status   MRSA by PCR NEGATIVE  NEGATIVE Final   Comment:            The GeneXpert MRSA Assay (FDA     approved for NASAL specimens     only), is one component of a     comprehensive MRSA colonization     surveillance program. It is not     intended to diagnose MRSA     infection nor to guide or     monitor treatment for  MRSA infections.     Procedures and Diagnostic Studies: Ct Head Wo Contrast  07/11/2013   *RADIOLOGY REPORT*  Clinical Data: Head trauma secondary to a fall.  Abrasion to the left side of forehead.  CT HEAD WITHOUT CONTRAST  Technique:  Contiguous axial images were obtained from the base of the skull through the vertex without contrast.  Comparison: None.  Findings: There is no acute intracranial hemorrhage or mass lesion. There is fairly severe frontal and temporal lobe atrophy.  There is a vague area of lucency low in the right basal ganglia which may represent an old lacunar infarct.  Small areas of lucency in the anterior limbs of both internal capsules consistent with small vessel ischemic disease.  IMPRESSION: No acute intracranial abnormality.  Frontal and temporal lobe atrophy.  Probable old right basal ganglia lacunar infarct.   Original Report Authenticated By: Francene Boyers, M.D.   Ct Abdomen Pelvis W Contrast  07/14/2013   *RADIOLOGY REPORT*  Clinical Data: 79 year old female with abdominal and pelvic pain. Patient on recent chemotherapy for endometrial cancer.  CT ABDOMEN AND PELVIS WITH CONTRAST  Technique:  Multidetector CT imaging of the abdomen and pelvis was performed following the standard protocol during bolus administration of intravenous contrast.  Contrast:  100 ml intravenous Omnipaque-300  Comparison: 03/07/2006 CT of the abdomen  Findings: The liver, gallbladder, spleen, pancreas, and adrenal glands are unremarkable. Mild bilateral renal cortical thinning is noted as well as a punctate nonobstructing left upper pole renal calculus.  A left  renal cyst is also identified.  No free fluid, enlarged lymph nodes, biliary dilation or abdominal aortic aneurysm identified.  There is no evidence of bowel obstruction or pneumoperitoneum. The bowel and bladder are within normal limits. The patient is status post hysterectomy. A small right inguinal hernia containing fat is again noted.  No acute or suspicious bony abnormalities are identified. Degenerative changes of the lumbar spine and hips are identified.  IMPRESSION: No evidence of acute abnormality.  Punctate nonobstructing left upper pole renal calculus.   Original Report Authenticated By: Harmon Pier, M.D.    Scheduled Meds: . feeding supplement  237 mL Oral TID BM  . haloperidol  1 mg Oral BID  . rivaroxaban  20 mg Oral Q breakfast   Continuous Infusions: . 0.9 % NaCl with KCl 20 mEq / L 75 mL/hr at 07/16/13 2300    Time spent: 25 minutes.   LOS: 4 days   Deziree Mokry  [facility] Pager [phone].   *Please note that the hospitalists switch teams on Wednesdays. Please call the flow manager at [phone] if you are having difficulty reaching the hospitalist taking care of this patient as she can update you and provide the most up-to-date pager number of provider caring for the patient. If 8PM-8AM, please contact night-coverage at [email or web address], password [facility]  07/17/2013, 8:20 AM

## 2013-07-17 NOTE — Progress Notes (Signed)
OT Cancellation Note  Patient Details Name: Misty Alvarado MRN: [id number] DOB: 09-Nov-1933   Cancelled Treatment:    Reason Eval/Treat Not Completed: Other (comment)  Pt is planning to d/c home today with 24/7 assistance.  She has a 3:1 commode but needs a RW.  Spoke to [employer].   [name] 07/17/2013, 1:36 PM Marica Otter, OTR/L [phone] 07/17/2013

## 2013-07-19 ENCOUNTER — Telehealth: Payer: Self-pay | Admitting: *Deleted

## 2013-07-19 NOTE — Telephone Encounter (Signed)
Transitional care  Admit date:07/13/2013 Discharge date:07/17/2013  Discharge diagnoses: Principal problem;    Atrial fibrillation with RVR Active problems:    Hypertension    Hyperlipidemia    Endometrial cancer    UTI    Abdominal pain    Hypokalemia    Rash and nonspecific skin eruption    Protein-calorie malnutrition,severe    Antineoplastic chemotherapy induced pancytopenia  Recommendations for outpatient follow-up: 1. The patient was started on xarelto for treatment of non-valvular PAF:  CBC and creatinine should be periodically monitored while on this medicine.  Aspirin was discontinued and the patient was advised to avoid NSAIDS. 2.  Home health PT was set up 3. triglycerides 239 on FLP.  Consider treating. 4. Recommend close follow up of chemotherapy induced pancytopenia. 5. F/u final blood culture results ( negative to date)  Discharge condition: improved  Talked with daughter, helen, who is her caregiver. She has not started her mom's xaralto yet., primarily because  She thinks her mom's a fib may have been caused by her UTI, and it is not a chronic condition. she plans on talking with dr Davy Pique at office visit on Friday.  She is compliant with medication regimen.    Post hospital follow-up scheduled fir 8-07/2013 at 4:30.

## 2013-07-19 NOTE — Telephone Encounter (Signed)
Transitional care  Admit date:07/13/2013 Discharge date:07/17/2013  Discharge diagnoses: Principal problem;    Atrial fibrillation with RVR Active problems:    Hypertension    Hyperlipidemia    Endometrial cancer    UTI    Abdominal pain    Hypokalemia    Rash and nonspecific skin eruption

## 2013-07-20 ENCOUNTER — Encounter: Payer: Self-pay | Admitting: Internal Medicine

## 2013-07-20 ENCOUNTER — Ambulatory Visit (INDEPENDENT_AMBULATORY_CARE_PROVIDER_SITE_OTHER): Payer: Medicare Other | Admitting: Internal Medicine

## 2013-07-20 VITALS — BP 158/90 | HR 98 | Temp 98.8°F | Resp 20 | Wt 190.0 lb

## 2013-07-20 DIAGNOSIS — I4891 Unspecified atrial fibrillation: Secondary | ICD-10-CM

## 2013-07-20 DIAGNOSIS — I10 Essential (primary) hypertension: Secondary | ICD-10-CM

## 2013-07-20 DIAGNOSIS — D61810 Antineoplastic chemotherapy induced pancytopenia: Secondary | ICD-10-CM

## 2013-07-20 DIAGNOSIS — T451X5A Adverse effect of antineoplastic and immunosuppressive drugs, initial encounter: Secondary | ICD-10-CM

## 2013-07-20 DIAGNOSIS — N390 Urinary tract infection, site not specified: Secondary | ICD-10-CM

## 2013-07-20 DIAGNOSIS — C541 Malignant neoplasm of endometrium: Secondary | ICD-10-CM

## 2013-07-20 LAB — CULTURE, BLOOD (ROUTINE X 2): Culture: NO GROWTH

## 2013-07-20 MED ORDER — HYDROCODONE-ACETAMINOPHEN 10-325 MG PO TABS: 1.0000 | ORAL_TABLET | Freq: Four times a day (QID) | ORAL | Status: DC | PRN

## 2013-07-20 NOTE — Progress Notes (Signed)
Subjective:    Patient ID: Misty Alvarado, female    DOB: 05-31-1933, 79 y.o.   MRN: [id number]  HPI  79 year old patient who is seen today following a hospital discharge. Hospital medical records reviewed.  She presented to the ED complaining of abdominal pain in the setting of a UTI. The patient was treated with Rocephin for 3 days. Prior to admission she received chemotherapy for endometrial cancer and was noted to have pancytopenia. She was also noted to have atrial fibrillation with rapid ventricular response. She has had no prior episodes of atrial fibrillation. Initial intent was to discharge on anticoagulation but after careful consideration of risk benefit ratio anticoagulation therapy was not instituted. The patient has done reasonable well since her discharge 3 days ago. She is accompanied by her daughter today she is followed closely by oncology  Stable medical problems include hypertension.  Past Medical History  Diagnosis Date  . ALLERGIC RHINITIS 10/03/2007  . HYPERLIPIDEMIA 10/03/2007  . HYPERTENSION 10/03/2007  . Ulcer     gastric  . GERD (gastroesophageal reflux disease)     "ONCE IN A WHILE"  TUMS IF NEEDED  . Arthritis     KNEES  . Endometrial cancer     new diagnosis 03/02/13    History   Social History  . Marital Status: Widowed    Spouse Name: N/A    Number of Children: N/A  . Years of Education: N/A   Occupational History  . Not on file.   Social History Main Topics  . Smoking status: Never Smoker   . Smokeless tobacco: Never Used  . Alcohol Use: No  . Drug Use: No  . Sexually Active: Not on file   Other Topics Concern  . Not on file   Social History Narrative  . No narrative on file    Past Surgical History  Procedure Laterality Date  . Cardiac catheterization    . Tubal ligation    . Lithotripsy      for kidney stone  . Partial thyroidectomy for a growth      YRS AGO   . Robotic assisted total hysterectomy with bilateral salpingo  oopherectomy Bilateral 04/24/2013    Procedure: ROBOTIC ASSISTED TOTAL HYSTERECTOMY WITH BILATERAL SALPINGO OOPHORECTOMY,  LYMPH NODE DISSECTION;  Surgeon: Rejeana Brock A. Duard Brady, MD;  Location: [facility];  Service: Gynecology;  Laterality: Bilateral;  . Lymph node dissection N/A 04/24/2013    Procedure: LYMPH NODE DISSECTION;  Surgeon: Rejeana Brock A. Duard Brady, MD;  Location: [facility];  Service: Gynecology;  Laterality: N/A;  . Abdominal hysterectomy    . Back surgery  1968    Ruptured disc repair    Family History  Problem Relation Age of Onset  . Arthritis Mother   . Colon cancer Neg Hx   . Cancer Sister     1 deceased from lung cancer  . Cancer Brother     2 deceased from lung cancer    Allergies  Allergen Reactions  . Dexamethasone Other (See Comments)    Mood disturbance, psychosis  . Rocephin (Ceftriaxone Sodium In Dextrose) Rash    Current Outpatient Prescriptions on File Prior to Visit  Medication Sig Dispense Refill  . amLODipine (NORVASC) 5 MG tablet Take 5 mg by mouth every evening.      . haloperidol (HALDOL) 1 MG tablet Take 1 mg by mouth 2 (two) times daily. 8:00 am and 8:00 pm      . hydrocortisone-pramoxine (PROCTOFOAM-HC) rectal foam Place 1 applicator rectally 2 (  two) times daily as needed for hemorrhoids.      Marland Kitchen LORazepam (ATIVAN) 0.5 MG tablet Take 1-2 tabs  under the tongue or swallow  Every 6 hrs. As needed for nausea.  Will make you drowsy  20 tablet  0  . losartan-hydrochlorothiazide (HYZAAR) 100-12.5 MG per tablet Take 1 tablet by mouth every morning.      . mometasone (NASONEX) 50 MCG/ACT nasal spray Place 2 sprays into the nose daily as needed (for allergies).      . ondansetron (ZOFRAN) 8 MG tablet Take 1-2 tablets (8-16 mg total) by mouth every 12 (twelve) hours as needed for nausea (Will not make you drowsy).  20 tablet  2  . Rivaroxaban (XARELTO) 20 MG TABS tablet Take 1 tablet (20 mg total) by mouth daily with breakfast.  30 tablet  3   No current facility-administered  medications on file prior to visit.    BP 158/90  Pulse 98  Temp(Src) 98.8 F (37.1 C) (Oral)  Resp 20  Wt 190 lb (86.183 kg)  BMI 28.9 kg/m2  SpO2 97%          Review of Systems  Constitutional: Positive for fatigue.  HENT: Negative for hearing loss, congestion, sore throat, rhinorrhea, dental problem, sinus pressure and tinnitus.   Eyes: Negative for pain, discharge and visual disturbance.  Respiratory: Negative for cough and shortness of breath.   Cardiovascular: Negative for chest pain, palpitations and leg swelling.  Gastrointestinal: Negative for nausea, vomiting, abdominal pain, diarrhea, constipation, blood in stool and abdominal distention.  Genitourinary: Negative for dysuria, urgency, frequency, hematuria, flank pain, vaginal bleeding, vaginal discharge, difficulty urinating, vaginal pain and pelvic pain.  Musculoskeletal: Negative for joint swelling, arthralgias and gait problem.  Skin: Negative for rash.  Neurological: Positive for weakness. Negative for dizziness, syncope, speech difficulty, numbness and headaches.  Hematological: Negative for adenopathy.  Psychiatric/Behavioral: Negative for behavioral problems, dysphoric mood and agitation. The patient is not nervous/anxious.        Objective:   Physical Exam  Constitutional: She is oriented to person, place, and time. She appears well-developed and well-nourished.  HENT:  Head: Normocephalic.  Right Ear: External ear normal.  Left Ear: External ear normal.  Mouth/Throat: Oropharynx is clear and moist.  Eyes: Conjunctivae and EOM are normal. Pupils are equal, round, and reactive to light.  Neck: Normal range of motion. Neck supple. No thyromegaly present.  Cardiovascular: Normal rate, regular rhythm, normal heart sounds and intact distal pulses.   Rate approaching 100 but regular  Pulmonary/Chest: Effort normal and breath sounds normal.  Abdominal: Soft. Bowel sounds are normal. She exhibits no mass.  There is no tenderness.  Musculoskeletal: Normal range of motion.  Lymphadenopathy:    She has no cervical adenopathy.  Neurological: She is alert and oriented to person, place, and time.  Skin: Skin is warm and dry. No rash noted.  Chemotherapy-induced baldness  Psychiatric: She has a normal mood and affect. Her behavior is normal.          Assessment & Plan:    chemotherapy induced pancytopenia. Followup oncology Single episode of paroxysmal atrial fibrillation.  The risks and benefits of anticoagulation discussed. The family wishes to defer anticoagulation at this time unless there is a second episode of atrial fibrillation Hypertension stable Endometrial cancer  Return here as needed or 6 months Close followup oncology

## 2013-07-20 NOTE — Patient Instructions (Signed)
Oncology follow-up as scheduled  Call or return to clinic prn if these symptoms worsen or fail to improve as anticipated.   

## 2013-07-24 ENCOUNTER — Telehealth: Payer: Self-pay | Admitting: Hematology and Oncology

## 2013-07-24 NOTE — Telephone Encounter (Signed)
, °

## 2013-07-27 ENCOUNTER — Telehealth: Payer: Self-pay | Admitting: Oncology

## 2013-07-30 ENCOUNTER — Telehealth: Payer: Self-pay | Admitting: Oncology

## 2013-07-30 ENCOUNTER — Ambulatory Visit: Payer: Self-pay | Admitting: Oncology

## 2013-07-30 ENCOUNTER — Telehealth: Payer: Self-pay | Admitting: Internal Medicine

## 2013-07-30 ENCOUNTER — Other Ambulatory Visit: Payer: Self-pay | Admitting: *Deleted

## 2013-07-30 ENCOUNTER — Other Ambulatory Visit: Payer: Self-pay | Admitting: Lab

## 2013-07-30 NOTE — Telephone Encounter (Signed)
Called Freida Busman back asked him if he called pt's daughter? Freida Busman said yes he has tried 5 times to contact pt and daughter and neither have returned his calls. Told him okay will let Dr. Kirtland Bouchard know. Thanks.

## 2013-07-30 NOTE — Telephone Encounter (Signed)
PT was discharged from the hospital, with afib, and was referred to home health PT. The physical therapist states that he has tried to contact her multiple time, with no success. He is discharging the pt, and she was never seen. Please assist.

## 2013-07-30 NOTE — Telephone Encounter (Signed)
PT DTR [name] CALLED TO R/S LAB APPT 09/19 @ 9:45 MD 09/21 @ 3 DTR REQUESTED LATE AFTERNOON APPT.

## 2013-07-31 ENCOUNTER — Other Ambulatory Visit (HOSPITAL_BASED_OUTPATIENT_CLINIC_OR_DEPARTMENT_OTHER): Payer: Medicare Other | Admitting: Lab

## 2013-07-31 DIAGNOSIS — C541 Malignant neoplasm of endometrium: Secondary | ICD-10-CM

## 2013-07-31 DIAGNOSIS — C549 Malignant neoplasm of corpus uteri, unspecified: Secondary | ICD-10-CM

## 2013-07-31 LAB — COMPREHENSIVE METABOLIC PANEL (CC13)
ALT: 16 U/L (ref 0–55)
Albumin: 3.2 g/dL — ABNORMAL LOW (ref 3.5–5.0)
CO2: 22 mEq/L (ref 22–29)
Calcium: 8.8 mg/dL (ref 8.4–10.4)
Chloride: 106 mEq/L (ref 98–109)
Potassium: 3.3 mEq/L — ABNORMAL LOW (ref 3.5–5.1)
Sodium: 141 mEq/L (ref 136–145)
Total Protein: 6.5 g/dL (ref 6.4–8.3)

## 2013-07-31 LAB — CBC WITH DIFFERENTIAL/PLATELET
BASO%: 1 % (ref 0.0–2.0)
HCT: 22.4 % — ABNORMAL LOW (ref 34.8–46.6)
MCHC: 35.2 g/dL (ref 31.5–36.0)
MONO#: 0.3 10*3/uL (ref 0.1–0.9)
NEUT%: 36.7 % — ABNORMAL LOW (ref 38.4–76.8)
WBC: 2.4 10*3/uL — ABNORMAL LOW (ref 3.9–10.3)
lymph#: 1.1 10*3/uL (ref 0.9–3.3)

## 2013-08-01 ENCOUNTER — Other Ambulatory Visit: Payer: Self-pay | Admitting: Lab

## 2013-08-01 ENCOUNTER — Ambulatory Visit: Payer: Self-pay

## 2013-08-01 ENCOUNTER — Other Ambulatory Visit: Payer: Self-pay | Admitting: Oncology

## 2013-08-01 DIAGNOSIS — C541 Malignant neoplasm of endometrium: Secondary | ICD-10-CM

## 2013-08-01 NOTE — Progress Notes (Signed)
Note of information  Patient did not keep appointment with this MD on 07-30-13, as daughter was not aware of the appointment. Labs done on 07-31-13 found by this MD now, counts too low for chemo planned today. No documentation in EMR that counts were communicated to patient/daughter. EMR has chemo 1030 today and IVF + this MD 08-02-13. This MD reached daughter [name] by phone now. Informed daughter that counts too low to treat today so will delay this treatment. Patient is fatigued but not SOB at rest and is ambulating; she had some bleeding from hemorrhoids over weekend, none now. She has no fever or symptoms of infection. K+ also a little low and daughter will increase this in diet today. Daughter will bring her at 1230 on 08-02-13 for repeat labs and would agree to PRBCs if needed then. This MD will see her for apt as scheduled 08-02-13. Neutropenic precautions reviewed with daughter. Infusion and pharmacy notified now of cancellation of today's chemo. POF to scheduler for lab 08-02-13 (cbc, bmet, type and hold)  L.Darrold Span, MD

## 2013-08-02 ENCOUNTER — Ambulatory Visit: Payer: Self-pay | Admitting: Oncology

## 2013-08-02 ENCOUNTER — Ambulatory Visit: Payer: Medicare Other

## 2013-08-02 ENCOUNTER — Encounter: Payer: Self-pay | Admitting: Oncology

## 2013-08-02 ENCOUNTER — Other Ambulatory Visit (HOSPITAL_BASED_OUTPATIENT_CLINIC_OR_DEPARTMENT_OTHER): Payer: Medicare Other | Admitting: Lab

## 2013-08-02 VITALS — BP 114/69 | HR 103 | Temp 98.5°F | Resp 20 | Ht 68.0 in | Wt 187.5 lb

## 2013-08-02 DIAGNOSIS — C541 Malignant neoplasm of endometrium: Secondary | ICD-10-CM

## 2013-08-02 DIAGNOSIS — C549 Malignant neoplasm of corpus uteri, unspecified: Secondary | ICD-10-CM

## 2013-08-02 LAB — CBC WITH DIFFERENTIAL/PLATELET
BASO%: 0.7 % (ref 0.0–2.0)
EOS%: 1.2 % (ref 0.0–7.0)
MCHC: 35.1 g/dL (ref 31.5–36.0)
MONO#: 0.4 10*3/uL (ref 0.1–0.9)
RBC: 2.61 10*6/uL — ABNORMAL LOW (ref 3.70–5.45)
WBC: 3.7 10*3/uL — ABNORMAL LOW (ref 3.9–10.3)
lymph#: 1.5 10*3/uL (ref 0.9–3.3)

## 2013-08-02 LAB — BASIC METABOLIC PANEL (CC13)
Chloride: 105 mEq/L (ref 98–109)
Creatinine: 1.1 mg/dL (ref 0.6–1.1)
Potassium: 3.7 mEq/L (ref 3.5–5.1)

## 2013-08-02 LAB — HOLD TUBE, BLOOD BANK

## 2013-08-02 NOTE — Progress Notes (Signed)
OFFICE PROGRESS NOTE   08/02/2013   Physicians:D.ClarkePearson, J.Kinard, P. 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 Cycle 1 was complicated by extreme agitation and paranoia, likely in part related to steroids. She is now on haldol bid by PCP and is staying with daughter Marin Roberts; patient and granddaughter are not able to tell me if she saw clinical psychologist as we had planned. She had cycle 3 taxol carboplatin on 07-11-13 with single dose of neupogen on 07-12-13. She was neutropenic at [facility] of 0.9 and had hgb of 7.9 on day 21 (07-31-13) such that chemo was cancelled for today. With improvement in counts today and no apparent marked symptoms of anemia presently, she will not receive PRBCs today.  Note she is now on xarelto for paroxysmal atrial fibrillation, since hospitalization for this problem 8-1 thru 07-17-13. She saw Dr Frederica Kuster on 07-20-13.  She has seen Dr Roselind Messier in consultation; he would like to reevaluate her towards completion of chemotherapy.  She does not have PAC.  ONCOLOGIC HISTORY  Patient had been in usual good health when she developed several weeks of vaginal spotting in early 2014, without other symptoms. She had endometrial biopsy 03-02-2012 [id number]) with grade 1 endometroid adenocarcinoma. She was referred to Dr Yolande Jolly, his exam not remarkable otherwise. She had colonoscopy by Dr Leone Payor on 04-23-13, with a diminutive adenoma and otherwise benign. She had CXR preoperatively 04-20-2013 with chronic bronchitic and interstitial changes and degenerative changes of spine, otherwise not remarkable; I do not believe that she had other imaging of abdomen/ pelvis. She was taken  to total robotic hysterectomy with BSO and bilateral pelvic node evaluation by Dr Duard Brady on 04-24-2013. Post operative course was uncomplicated and she was discharged home on POD 1. Pathology [phone]) found high grade papillary serous carcinoma tumor size 3.5 cm with myometrial invasion 0.5 cm where myometrium was 1.5 cm, with focal angiolymphatic invasion, 6 right pelvic and 1 left pelvic node negative, for T1aN0 staging. She saw Dr Yolande Jolly in follow up on 05-09-13, with recommendation for 6 cycles of adjuvant taxol/ carboplatin followed by vaginal vault brachytherapy. Cycle 1 taxol carbo was 05-30-13, complicated by apparent steroid psychosis, severe aching and neutropenia. Cycle 2 was carboplatin only on 06-20-13; she was hospitalized 7-11 and 06-23-13 with dehydration and decreased blood pressure.  Cycle 3 taxol at 135 mg/m2 + carboplatin was given on 07-11-13, with hospitalization 8-1 thru 07-17-13 for new atrial fibrillation with RVR, begun on xarelto then.    Review of systems as above, also: No SOB at rest or getting into office today. Ambulatory at least minimally at home, significant arthritis limiting as it has. No bleeding. No chest pain. No new or different pain otherwise. No swelling LE. Not great appetite, but no nausea or vomiting now. Able to drink sprite well and keeps a mug with this easily available at home. Bowels moving. Bladder ok. No fever or other symptoms of infection. Denies increased peripheral neuropathy, stable symptoms in feet.  "Bored" at daughter's home during day and frequently chilly with the [facility]. Granddaughter tells me that patient often does not change out of pajamas during day.  Remainder of 10 point Review of Systems negative/ unchanged..  Objective:  Vital signs in last 24 hours:  BP 114/69  Pulse 103  Temp(Src) 98.5 F (36.9 C) (Oral)  Resp 20  Ht 5\' 8"  (1.727 m)  Wt 187 lb 8 oz (85.049 kg)  BMI 28.52 kg/m2  Awake, alert, more talkative today, looks  comfortable seated in WC on RA. Complete alopecia. Granddaughter brought her to office, daughter  not here by time of my visit.  HEENT:PERRL, sclera clear, anicteric and oropharynx clear, no lesions. Mucous membranes moist. PERRL. Speech fluent and appropriate. No JVD.  Lymphatics cervical,suraclavicular, axillary adenopathy. Cannot examine inguinal regions adequately on exam done in WC. Resp: clear to auscultation bilaterally and normal percussion bilaterally Cardio: regular rate and rhythm GI: soft, nontender, few bowel sounds, not obviously distended, no appreciable HSM on this exam Extremities: without pitting edema, cords, tenderness Neuro: otherwise not focal on this exam in [facility] Skin without rash or ecchymoses/ petechiae  Lab Results:  Results for orders placed in visit on 08/02/13  CBC WITH DIFFERENTIAL      Result Value Range   WBC 3.7 (*) 3.9 - 10.3 10e3/uL   NEUT# 1.8  1.5 - 6.5 10e3/uL   HGB 8.3 (*) 11.6 - 15.9 g/dL   HCT 16.1 (*) 09.6 - 04.5 %   Platelets 410 (*) 145 - 400 10e3/uL   MCV 90.7  79.5 - 101.0 fL   MCH 31.9  25.1 - 34.0 pg   MCHC 35.1  31.5 - 36.0 g/dL   RBC 4.09 (*) 8.11 - 9.14 10e6/uL   RDW 18.1 (*) 11.2 - 14.5 %   lymph# 1.5  0.9 - 3.3 10e3/uL   MONO# 0.4  0.1 - 0.9 10e3/uL   Eosinophils Absolute 0.0  0.0 - 0.5 10e3/uL   Basophils Absolute 0.0  0.0 - 0.1 10e3/uL   NEUT% 49.0  38.4 - 76.8 %   LYMPH% 39.0  14.0 - 49.7 %   MONO% 10.1  0.0 - 14.0 %   EOS% 1.2  0.0 - 7.0 %   BASO% 0.7  0.0 - 2.0 %  BASIC METABOLIC PANEL (CC13)      Result Value Range   Sodium 139  136 - 145 mEq/L   Potassium 3.7  3.5 - 5.1 mEq/L   Chloride 105  98 - 109 mEq/L   CO2 21 (*) 22 - 29 mEq/L   Glucose 101  70 - 140 mg/dl   BUN 78.2  7.0 - 95.6 mg/dL   Creatinine 1.1  0.6 - 1.1 mg/dL   Calcium 9.0  8.4 - 21.3 mg/dL  HOLD TUBE, BLOOD BANK      Result Value Range   Hold Tube, Blood Bank Blood Bank Order Cancelled       Studies/Results:  CT head without contrast 07-11-13  due to "hx trauma with fall" with no acute findings, some atrophy and probable old right basal ganglia lacunar infarct     CT ABDOMEN AND PELVIS WITH CONTRAST  07-14-13 Technique: Multidetector CT imaging of the abdomen and pelvis was  performed following the standard protocol during bolus  administration of intravenous contrast.  Contrast: 100 ml intravenous Omnipaque-300  Comparison: 03/07/2006 CT of the abdomen  Findings: The liver, gallbladder, spleen, pancreas, and adrenal  glands are unremarkable.  Mild bilateral renal cortical thinning is noted as well as a  punctate nonobstructing left upper pole renal calculus. A left  renal cyst is also identified.  No free fluid, enlarged lymph nodes, biliary dilation or abdominal  aortic aneurysm identified.  There is no evidence of bowel obstruction or pneumoperitoneum.  The  bowel and bladder are within normal limits.  The patient is status post hysterectomy.  A small right inguinal hernia containing fat is again noted.  No acute or suspicious bony abnormalities are identified.  Degenerative changes of the lumbar spine and hips are identified.  IMPRESSION:  No evidence of acute abnormality.  Punctate nonobstructing left upper pole renal calculus.    Medications: I have reviewed the patient's current medications, including haldol 1 mg bid and xarelto. She has hyzaar and norvasc listed on medications since most recent hospitalization  Assessment/Plan:  1. T1aN0 grade 3 papillary serous endometrial carcinoma with + angiolymphatic invasion: We will reschedule cycle 4 next week, RN to coordinate with daughter. She should have gCSF probably best with several days of neupogen due to taxol aches. With problems with cycle 1, premedication decadron has been decreased to 20 mg 12 hours prior + IV dose with chemo, and will keep taxol at 135 mg/m2. She needs additional IVF on day 2. Vaginal brachytherapy by Dr Roselind Messier planned, timing still to be  determined; Dr Roselind Messier wants to see her again towards completion of chemo, no appointment set up with him yet. She is to see Dr Yolande Jolly on 08-10-13. 2.extreme agitation and paranoia after cycle 1 chemo, likely steroid exacerbation of previous tendencies. Bid haldol apparently helpful; I am not sure that she kept appointment with [place] psychologist. 3.paroxysmal a fib with RVR: spontaneously converted during hospitalization 8-1 thru 07-17-13, begun on xarelto then. Need to watch platelets closely on the xarelto 4.HTN x years. Apparently back on antihypertensives after these were held for low BP after previous chemo 5.degenerative arthritis knees  6.allergic rhinitis 7.unremarkable colonoscopy and mammograms around time of cancer diagnosis   Patient and granddaughter were comfortable with discussion and plan. We will try to reach daughter to coordinate next treatments.   Reece Packer, MD   08/02/2013, 5:43 PM

## 2013-08-03 ENCOUNTER — Other Ambulatory Visit: Payer: Self-pay | Admitting: Oncology

## 2013-08-03 DIAGNOSIS — C541 Malignant neoplasm of endometrium: Secondary | ICD-10-CM

## 2013-08-03 NOTE — Progress Notes (Signed)
Note of information  RN spoke with daughter [name] re appointments for chemo 08-06-13 and IVF 08-07-13.  Additional orders done now to add neupogen on 8-26 with IVF, on 8-29 with Dr Wanita Chamberlain apt and for f/u apt LL 9-3 with cbc cmet and neupogen then. Message to schedulers.  Ila Mcgill, MD

## 2013-08-05 ENCOUNTER — Telehealth: Payer: Self-pay | Admitting: Oncology

## 2013-08-05 NOTE — Telephone Encounter (Signed)
Added additional appts for 8/29 and 9/3 per 8/22 pof. Call dtr [name]'s cell and lmonvm re add on and asked that she get new schedule 8/25. Also confirmed 8/25 appt and added comment to appt to send dtr for new schedule.

## 2013-08-06 ENCOUNTER — Ambulatory Visit (HOSPITAL_BASED_OUTPATIENT_CLINIC_OR_DEPARTMENT_OTHER): Payer: Medicare Other

## 2013-08-06 DIAGNOSIS — C549 Malignant neoplasm of corpus uteri, unspecified: Secondary | ICD-10-CM

## 2013-08-06 DIAGNOSIS — Z5111 Encounter for antineoplastic chemotherapy: Secondary | ICD-10-CM

## 2013-08-06 DIAGNOSIS — C541 Malignant neoplasm of endometrium: Secondary | ICD-10-CM

## 2013-08-06 MED ORDER — SODIUM CHLORIDE 0.9 % IV SOLN
447.0000 mg | Freq: Once | INTRAVENOUS | Status: AC
  Administered 2013-08-06: 450 mg via INTRAVENOUS
  Filled 2013-08-06: qty 45

## 2013-08-06 MED ORDER — FAMOTIDINE IN NACL 20-0.9 MG/50ML-% IV SOLN
20.0000 mg | Freq: Once | INTRAVENOUS | Status: AC
  Administered 2013-08-06: 20 mg via INTRAVENOUS

## 2013-08-06 MED ORDER — DEXAMETHASONE SODIUM PHOSPHATE 20 MG/5ML IJ SOLN
20.0000 mg | Freq: Once | INTRAMUSCULAR | Status: AC
  Administered 2013-08-06: 20 mg via INTRAVENOUS

## 2013-08-06 MED ORDER — SODIUM CHLORIDE 0.9 % IV SOLN
Freq: Once | INTRAVENOUS | Status: AC
  Administered 2013-08-06: 13:00:00 via INTRAVENOUS

## 2013-08-06 MED ORDER — PACLITAXEL CHEMO INJECTION 300 MG/50ML
135.0000 mg/m2 | Freq: Once | INTRAVENOUS | Status: AC
  Administered 2013-08-06: 282 mg via INTRAVENOUS
  Filled 2013-08-06: qty 47

## 2013-08-06 MED ORDER — LORAZEPAM 1 MG PO TABS
1.0000 mg | ORAL_TABLET | Freq: Once | ORAL | Status: AC | PRN
  Administered 2013-08-06: 1 mg via ORAL

## 2013-08-06 MED ORDER — DIPHENHYDRAMINE HCL 50 MG/ML IJ SOLN
25.0000 mg | Freq: Once | INTRAMUSCULAR | Status: AC
  Administered 2013-08-06: 25 mg via INTRAVENOUS

## 2013-08-06 MED ORDER — ONDANSETRON 16 MG/50ML IVPB (CHCC)
16.0000 mg | Freq: Once | INTRAVENOUS | Status: AC
  Administered 2013-08-06: 16 mg via INTRAVENOUS

## 2013-08-06 NOTE — Progress Notes (Signed)
Labs reviewed,Verbal order ok to treat per MD. Pt states she is feeling better than she has been. Pt denies shortness of breath, chest pain. Pt was able to go to store yesterday and walk around without feeling wiped out.

## 2013-08-06 NOTE — Patient Instructions (Addendum)
[facility] Discharge Instructions for Patients Receiving Chemotherapy  Today you received the following chemotherapy agents Taxol/Carbo  To help prevent nausea and vomiting after your treatment, we encourage you to take your nausea medication as directed   If you develop nausea and vomiting that is not controlled by your nausea medication, call the clinic.   BELOW ARE SYMPTOMS THAT SHOULD BE REPORTED IMMEDIATELY:  *FEVER GREATER THAN 100.5 F  *CHILLS WITH OR WITHOUT FEVER  NAUSEA AND VOMITING THAT IS NOT CONTROLLED WITH YOUR NAUSEA MEDICATION  *UNUSUAL SHORTNESS OF BREATH  *UNUSUAL BRUISING OR BLEEDING  TENDERNESS IN MOUTH AND THROAT WITH OR WITHOUT PRESENCE OF ULCERS  *URINARY PROBLEMS  *BOWEL PROBLEMS  UNUSUAL RASH Items with * indicate a potential emergency and should be followed up as soon as possible.  Feel free to call the clinic you have any questions or concerns. The clinic phone number is [phone].

## 2013-08-07 ENCOUNTER — Ambulatory Visit: Payer: Self-pay

## 2013-08-07 ENCOUNTER — Ambulatory Visit (HOSPITAL_BASED_OUTPATIENT_CLINIC_OR_DEPARTMENT_OTHER): Payer: Medicare Other

## 2013-08-07 VITALS — BP 113/66 | HR 74 | Temp 97.2°F | Resp 17

## 2013-08-07 DIAGNOSIS — C541 Malignant neoplasm of endometrium: Secondary | ICD-10-CM

## 2013-08-07 DIAGNOSIS — Z5189 Encounter for other specified aftercare: Secondary | ICD-10-CM

## 2013-08-07 DIAGNOSIS — C549 Malignant neoplasm of corpus uteri, unspecified: Secondary | ICD-10-CM

## 2013-08-07 MED ORDER — FILGRASTIM 300 MCG/0.5ML IJ SOLN
300.0000 ug | Freq: Once | INTRAMUSCULAR | Status: AC
  Administered 2013-08-07: 300 ug via SUBCUTANEOUS
  Filled 2013-08-07: qty 0.5

## 2013-08-07 MED ORDER — SODIUM CHLORIDE 0.9 % IV SOLN
INTRAVENOUS | Status: AC
  Administered 2013-08-07: 14:00:00 via INTRAVENOUS

## 2013-08-07 NOTE — Patient Instructions (Addendum)
Dehydration, Adult Dehydration means your body does not have as much fluid as it needs. Your kidneys, brain, and heart will not work properly without the right amount of fluids and salt.  HOME CARE  Ask your doctor how to replace body fluid losses (rehydrate).  Drink enough fluids to keep your pee (urine) clear or pale yellow.  Drink small amounts of fluids often if you feel sick to your stomach (nauseous) or throw up (vomit).  Eat like you normally do.  Avoid:  Foods or drinks high in sugar.  Bubbly (carbonated) drinks.  Juice.  Very hot or cold fluids.  Drinks with caffeine.  Fatty, greasy foods.  Alcohol.  Tobacco.  Eating too much.  Gelatin desserts.  Wash your hands to avoid spreading germs (bacteria, viruses).  Only take medicine as told by your doctor.  Keep all doctor visits as told. GET HELP RIGHT AWAY IF:   You cannot drink something without throwing up.  You get worse even with treatment.  Your vomit has blood in it or looks greenish.  Your poop (stool) has blood in it or looks black and tarry.  You have not peed in 6 to 8 hours.  You pee a small amount of very dark pee.  You have a fever.  You pass out (faint).  You have belly (abdominal) pain that gets worse or stays in one spot (localizes).  You have a rash, stiff neck, or bad headache.  You get easily annoyed, sleepy, or are hard to wake up.  You feel weak, dizzy, or very thirsty. MAKE SURE YOU:   Understand these instructions.  Will watch your condition.  Will get help right away if you are not doing well or get worse. Document Released: 09/25/2009 Document Revised: 02/21/2012 Document Reviewed: 07/19/2011 [facility] Patient Information 2014 [place], [place]. Pegfilgrastim injection What is this medicine? PEGFILGRASTIM (peg fil GRA stim) helps the body make more white blood cells. It is used to prevent infection in people with low amounts of white blood cells following cancer  treatment. This medicine may be used for other purposes; ask your health care provider or pharmacist if you have questions. What should I tell my health care provider before I take this medicine? They need to know if you have any of these conditions: -sickle cell disease -an unusual or allergic reaction to pegfilgrastim, filgrastim, E.coli protein, other medicines, foods, dyes, or preservatives -pregnant or trying to get pregnant -breast-feeding How should I use this medicine? This medicine is for injection under the skin. It is usually given by a health care professional in a hospital or clinic setting. If you get this medicine at home, you will be taught how to prepare and give this medicine. Do not shake this medicine. Use exactly as directed. Take your medicine at regular intervals. Do not take your medicine more often than directed. It is important that you put your used needles and syringes in a special sharps container. Do not put them in a trash can. If you do not have a sharps container, call your pharmacist or healthcare provider to get one. Talk to your pediatrician regarding the use of this medicine in children. While this drug may be prescribed for children who weigh more than 45 kg for selected conditions, precautions do apply Overdosage: If you think you have taken too much of this medicine contact a poison control center or emergency room at once. NOTE: This medicine is only for you. Do not share this medicine with others. What  if I miss a dose? If you miss a dose, take it as soon as you can. If it is almost time for your next dose, take only that dose. Do not take double or extra doses. What may interact with this medicine? -lithium -medicines for growth therapy This list may not describe all possible interactions. Give your health care provider a list of all the medicines, herbs, non-prescription drugs, or dietary supplements you use. Also tell them if you smoke, drink alcohol,  or use illegal drugs. Some items may interact with your medicine. What should I watch for while using this medicine? Visit your doctor for regular check ups. You will need important blood work done while you are taking this medicine. What side effects may I notice from receiving this medicine? Side effects that you should report to your doctor or health care professional as soon as possible: -allergic reactions like skin rash, itching or hives, swelling of the face, lips, or tongue -breathing problems -fever -pain, redness, or swelling where injected -shoulder pain -stomach or side pain Side effects that usually do not require medical attention (report to your doctor or health care professional if they continue or are bothersome): -aches, pains -headache -loss of appetite -nausea, vomiting -unusually tired This list may not describe all possible side effects. Call your doctor for medical advice about side effects. You may report side effects to FDA at 1-800-FDA-1088. Where should I keep my medicine? Keep out of the reach of children. Store in a refrigerator between 2 and 8 degrees C (36 and 46 degrees F). Do not freeze. Keep in carton to protect from light. Throw away this medicine if it is left out of the refrigerator for more than 48 hours. Throw away any unused medicine after the expiration date. NOTE: This sheet is a summary. It may not cover all possible information. If you have questions about this medicine, talk to your doctor, pharmacist, or health care provider.  2013, Elsevier/Gold Standard. (07/01/2008 3:41:44 PM)

## 2013-08-10 ENCOUNTER — Encounter: Payer: Self-pay | Admitting: Gynecology

## 2013-08-10 ENCOUNTER — Ambulatory Visit: Payer: Medicare Other | Attending: Gynecology | Admitting: Gynecology

## 2013-08-10 ENCOUNTER — Ambulatory Visit (HOSPITAL_BASED_OUTPATIENT_CLINIC_OR_DEPARTMENT_OTHER): Payer: Medicare Other

## 2013-08-10 VITALS — BP 147/74 | HR 92 | Temp 98.5°F | Resp 20 | Ht 66.0 in | Wt 185.6 lb

## 2013-08-10 DIAGNOSIS — Z9079 Acquired absence of other genital organ(s): Secondary | ICD-10-CM | POA: Insufficient documentation

## 2013-08-10 DIAGNOSIS — C549 Malignant neoplasm of corpus uteri, unspecified: Secondary | ICD-10-CM

## 2013-08-10 DIAGNOSIS — C541 Malignant neoplasm of endometrium: Secondary | ICD-10-CM

## 2013-08-10 DIAGNOSIS — Z90710 Acquired absence of both cervix and uterus: Secondary | ICD-10-CM | POA: Insufficient documentation

## 2013-08-10 DIAGNOSIS — Z5189 Encounter for other specified aftercare: Secondary | ICD-10-CM

## 2013-08-10 DIAGNOSIS — Z79899 Other long term (current) drug therapy: Secondary | ICD-10-CM | POA: Insufficient documentation

## 2013-08-10 DIAGNOSIS — I10 Essential (primary) hypertension: Secondary | ICD-10-CM | POA: Insufficient documentation

## 2013-08-10 DIAGNOSIS — E785 Hyperlipidemia, unspecified: Secondary | ICD-10-CM | POA: Insufficient documentation

## 2013-08-10 MED ORDER — FILGRASTIM 300 MCG/0.5ML IJ SOLN
300.0000 ug | Freq: Once | INTRAMUSCULAR | Status: AC
  Administered 2013-08-10: 300 ug via SUBCUTANEOUS
  Filled 2013-08-10: qty 0.5

## 2013-08-10 NOTE — Patient Instructions (Addendum)
Plan to follow up in three months or sooner if needed.  Please call for any needs. 

## 2013-08-10 NOTE — Progress Notes (Signed)
Consult Note: Gyn-Onc   Misty Alvarado 79 y.o. female  Chief Complaint  Patient presents with  . Endo Ca    Follow up visit     Assessment : Stage IA papillary serous carcinoma of the endometrium with lymphovascular space invasion. Currently receiving adjuvant chemotherapy.  Plan: Continue with completion of 6 cycles of carboplatin and Taxol chemotherapy followed by vaginal vault brachytherapy. Return to see me in 3 months.  Interval History:  Patient returns today as previously scheduled. She is now received 4 cycles of carboplatin and Taxol chemotherapy. She reports she is tolerating it well except for some leg and shoulder pain for a few days after each chemotherapy cycle. She denies any nausea vomiting and has no GI or GU symptoms. Overall her functional status is returned to normal.  HPI: Patient initially presented with postmenopausal bleeding. A biopsy showed endometrial cancer and she underwent a robotic assisted hysterectomy, BSO, and pelvic lymphadenectomy on Apr 24 2013. Final pathology showed a stage I a papillary serous endometrial carcinoma with lymph vascular invasion.  Adjuvant chemotherapy with carboplatin and Taxol as well as vaginal vault brachytherapy was recommended.  Review of Systems:10 point review of systems is negative except as noted in interval history.   Vitals: Blood pressure 147/74, pulse 92, temperature 98.5 F (36.9 C), temperature source Oral, resp. rate 20, height 5\' 6"  (1.676 m), weight 185 lb 9.6 oz (84.188 kg).  Physical Exam: General : The patient is a healthy woman in no acute distress.  HEENT: normocephalic, extraoccular movements normal; neck is supple without thyromegally  Lynphnodes: Supraclavicular and inguinal nodes not enlarged  Abdomen: Soft, non-tender, no ascites, no organomegally, no masses, no hernias all incisions are healing well Pelvic:  EGBUS vagina bladder urethra are normal  Cervix and uterus are surgically  absent  Bimanual rectovaginal exam revealed no masses induration or nodularity.   Lower extremities: No edema or varicosities. Normal range of motion      Allergies  Allergen Reactions  . Dexamethasone Other (See Comments)    Mood disturbance, psychosis  . Rocephin [Ceftriaxone Sodium In Dextrose] Rash    Past Medical History  Diagnosis Date  . ALLERGIC RHINITIS 10/03/2007  . HYPERLIPIDEMIA 10/03/2007  . HYPERTENSION 10/03/2007  . Ulcer     gastric  . GERD (gastroesophageal reflux disease)     "ONCE IN A WHILE"  TUMS IF NEEDED  . Arthritis     KNEES  . Endometrial cancer     new diagnosis 03/02/13    Past Surgical History  Procedure Laterality Date  . Cardiac catheterization    . Tubal ligation    . Lithotripsy      for kidney stone  . Partial thyroidectomy for a growth      YRS AGO   . Robotic assisted total hysterectomy with bilateral salpingo oopherectomy Bilateral 04/24/2013    Procedure: ROBOTIC ASSISTED TOTAL HYSTERECTOMY WITH BILATERAL SALPINGO OOPHORECTOMY,  LYMPH NODE DISSECTION;  Surgeon: Rejeana Brock A. Duard Brady, MD;  Location: [facility];  Service: Gynecology;  Laterality: Bilateral;  . Lymph node dissection N/A 04/24/2013    Procedure: LYMPH NODE DISSECTION;  Surgeon: Rejeana Brock A. Duard Brady, MD;  Location: [facility];  Service: Gynecology;  Laterality: N/A;  . Abdominal hysterectomy    . Back surgery  1968    Ruptured disc repair    Current Outpatient Prescriptions  Medication Sig Dispense Refill  . amLODipine (NORVASC) 5 MG tablet Take 5 mg by mouth every evening.      . haloperidol (  HALDOL) 1 MG tablet Take 1 mg by mouth 2 (two) times daily. 8:00 am and 8:00 pm      . HYDROcodone-acetaminophen (NORCO) 10-325 MG per tablet Take 1 tablet by mouth every 6 (six) hours as needed for pain.  90 tablet  1  . hydrocortisone-pramoxine (PROCTOFOAM-HC) rectal foam Place 1 applicator rectally 2 (two) times daily as needed for hemorrhoids.      Marland Kitchen LORazepam (ATIVAN) 0.5 MG tablet Take 1-2  tabs  under the tongue or swallow  Every 6 hrs. As needed for nausea.  Will make you drowsy  20 tablet  0  . losartan-hydrochlorothiazide (HYZAAR) 100-12.5 MG per tablet Take 1 tablet by mouth every morning.      . mometasone (NASONEX) 50 MCG/ACT nasal spray Place 2 sprays into the nose daily as needed (for allergies).      . ondansetron (ZOFRAN) 8 MG tablet Take 1-2 tablets (8-16 mg total) by mouth every 12 (twelve) hours as needed for nausea (Will not make you drowsy).  20 tablet  2  . Rivaroxaban (XARELTO) 20 MG TABS tablet Take 1 tablet (20 mg total) by mouth daily with breakfast.  30 tablet  3   No current facility-administered medications for this visit.    History   Social History  . Marital Status: Widowed    Spouse Name: N/A    Number of Children: N/A  . Years of Education: N/A   Occupational History  . Not on file.   Social History Main Topics  . Smoking status: Never Smoker   . Smokeless tobacco: Never Used  . Alcohol Use: No  . Drug Use: No  . Sexual Activity: Not on file   Other Topics Concern  . Not on file   Social History Narrative  . No narrative on file    Family History  Problem Relation Age of Onset  . Arthritis Mother   . Colon cancer Neg Hx   . Cancer Sister     1 deceased from lung cancer  . Cancer Brother     2 deceased from lung cancer      CLARKE-PEARSON,Dairon Procter L, MD 08/10/2013, 11:35 AM

## 2013-08-15 ENCOUNTER — Ambulatory Visit: Payer: Self-pay

## 2013-08-15 ENCOUNTER — Other Ambulatory Visit: Payer: Self-pay

## 2013-08-15 ENCOUNTER — Ambulatory Visit: Payer: Self-pay | Admitting: Oncology

## 2013-08-15 ENCOUNTER — Other Ambulatory Visit: Payer: Self-pay | Admitting: Lab

## 2013-08-15 ENCOUNTER — Telehealth: Payer: Self-pay

## 2013-08-15 DIAGNOSIS — I10 Essential (primary) hypertension: Secondary | ICD-10-CM

## 2013-08-15 DIAGNOSIS — C549 Malignant neoplasm of corpus uteri, unspecified: Secondary | ICD-10-CM

## 2013-08-15 DIAGNOSIS — D61810 Antineoplastic chemotherapy induced pancytopenia: Secondary | ICD-10-CM

## 2013-08-15 DIAGNOSIS — C541 Malignant neoplasm of endometrium: Secondary | ICD-10-CM

## 2013-08-15 DIAGNOSIS — N390 Urinary tract infection, site not specified: Secondary | ICD-10-CM

## 2013-08-15 DIAGNOSIS — T451X5A Adverse effect of antineoplastic and immunosuppressive drugs, initial encounter: Secondary | ICD-10-CM

## 2013-08-15 DIAGNOSIS — I4891 Unspecified atrial fibrillation: Secondary | ICD-10-CM

## 2013-08-15 MED ORDER — LORAZEPAM 0.5 MG PO TABS: ORAL_TABLET | ORAL | Status: DC

## 2013-08-15 NOTE — Telephone Encounter (Signed)
Spke with daughter [name] and she was not aware of the appointment for her mother today. R/s lab/ possible neupogen for 08-16-13.  Visit with Dr. Darrold Span is now 08-17-13. Ms. Giles hemorrhoids are bothering her and she would like a prescription for proctofoam.  Dr. Darrold Span recommends sitz baths 3-4 times a day until labs are done as she could be neutropenic. Called in refill for Ativan as requested and approved by Dr. Darrold Span.

## 2013-08-16 ENCOUNTER — Other Ambulatory Visit: Payer: Self-pay | Admitting: Oncology

## 2013-08-16 ENCOUNTER — Other Ambulatory Visit: Payer: Self-pay

## 2013-08-16 ENCOUNTER — Ambulatory Visit (HOSPITAL_COMMUNITY)
Admission: RE | Admit: 2013-08-16 | Discharge: 2013-08-16 | Disposition: A | Discharge status: Home / Self Care | Payer: Medicare Other | Source: Ambulatory Visit | Attending: Oncology | Admitting: Oncology

## 2013-08-16 ENCOUNTER — Ambulatory Visit (HOSPITAL_BASED_OUTPATIENT_CLINIC_OR_DEPARTMENT_OTHER): Payer: Medicare Other

## 2013-08-16 ENCOUNTER — Telehealth: Payer: Self-pay | Admitting: *Deleted

## 2013-08-16 ENCOUNTER — Ambulatory Visit: Payer: Medicare Other

## 2013-08-16 ENCOUNTER — Telehealth: Payer: Self-pay

## 2013-08-16 ENCOUNTER — Other Ambulatory Visit (HOSPITAL_BASED_OUTPATIENT_CLINIC_OR_DEPARTMENT_OTHER): Payer: Medicare Other | Admitting: Lab

## 2013-08-16 VITALS — BP 107/51 | HR 100 | Temp 98.2°F

## 2013-08-16 DIAGNOSIS — C541 Malignant neoplasm of endometrium: Secondary | ICD-10-CM

## 2013-08-16 DIAGNOSIS — C549 Malignant neoplasm of corpus uteri, unspecified: Secondary | ICD-10-CM

## 2013-08-16 DIAGNOSIS — T451X5A Adverse effect of antineoplastic and immunosuppressive drugs, initial encounter: Secondary | ICD-10-CM | POA: Insufficient documentation

## 2013-08-16 DIAGNOSIS — D702 Other drug-induced agranulocytosis: Secondary | ICD-10-CM

## 2013-08-16 DIAGNOSIS — Z5189 Encounter for other specified aftercare: Secondary | ICD-10-CM

## 2013-08-16 DIAGNOSIS — D6481 Anemia due to antineoplastic chemotherapy: Secondary | ICD-10-CM

## 2013-08-16 LAB — CBC WITH DIFFERENTIAL/PLATELET
BASO%: 1.1 % (ref 0.0–2.0)
Basophils Absolute: 0 10*3/uL (ref 0.0–0.1)
EOS%: 1.7 % (ref 0.0–7.0)
HGB: 7.8 g/dL — ABNORMAL LOW (ref 11.6–15.9)
MCH: 32.6 pg (ref 25.1–34.0)
MCV: 93 fL (ref 79.5–101.0)
MONO%: 34.8 % — ABNORMAL HIGH (ref 0.0–14.0)
RBC: 2.39 10*6/uL — ABNORMAL LOW (ref 3.70–5.45)
RDW: 18.7 % — ABNORMAL HIGH (ref 11.2–14.5)
lymph#: 0.9 10*3/uL (ref 0.9–3.3)

## 2013-08-16 LAB — HOLD TUBE, BLOOD BANK

## 2013-08-16 LAB — PREPARE RBC (CROSSMATCH)

## 2013-08-16 LAB — COMPREHENSIVE METABOLIC PANEL (CC13)
AST: 18 U/L (ref 5–34)
Albumin: 3.4 g/dL — ABNORMAL LOW (ref 3.5–5.0)
BUN: 15.9 mg/dL (ref 7.0–26.0)
Calcium: 8.9 mg/dL (ref 8.4–10.4)
Chloride: 105 mEq/L (ref 98–109)
Glucose: 113 mg/dl (ref 70–140)
Potassium: 3.4 mEq/L — ABNORMAL LOW (ref 3.5–5.1)
Sodium: 138 mEq/L (ref 136–145)
Total Protein: 6.8 g/dL (ref 6.4–8.3)

## 2013-08-16 MED ORDER — FILGRASTIM 300 MCG/0.5ML IJ SOLN
300.0000 ug | Freq: Once | INTRAMUSCULAR | Status: AC
  Administered 2013-08-16: 300 ug via SUBCUTANEOUS
  Filled 2013-08-16: qty 0.5

## 2013-08-16 MED ORDER — CIPROFLOXACIN HCL 250 MG PO TABS: 250.0000 mg | ORAL_TABLET | Freq: Two times a day (BID) | ORAL | Status: DC

## 2013-08-16 NOTE — Telephone Encounter (Signed)
Lm informed pt to come in on 08/17/13 @ 8:30am for labs. i tried calling the [phone].. But no answer...td

## 2013-08-16 NOTE — Telephone Encounter (Signed)
Misty Alvarado received neupogen today.  She is neutropenic ANC 0.06 and anemic today Hgb. = 7.8.  Pt. Afebrile.  She is more fatigued and cannot walk long distances.   Reviewed with Dr. Darrold Span.   Pt. T&C today for 2 units of blood in am. Called in Cipro 250 mg bid for 5 days.  Reviewed neutropenic precautions.  Call if Temp > or equal to 100.5. Will repeat cbc tomorrow to assess ANC.

## 2013-08-17 ENCOUNTER — Other Ambulatory Visit: Payer: Self-pay

## 2013-08-17 ENCOUNTER — Ambulatory Visit: Payer: Self-pay | Admitting: Oncology

## 2013-08-17 ENCOUNTER — Ambulatory Visit (HOSPITAL_BASED_OUTPATIENT_CLINIC_OR_DEPARTMENT_OTHER): Payer: Medicare Other | Admitting: Oncology

## 2013-08-17 ENCOUNTER — Other Ambulatory Visit (HOSPITAL_BASED_OUTPATIENT_CLINIC_OR_DEPARTMENT_OTHER): Payer: Medicare Other | Admitting: Lab

## 2013-08-17 ENCOUNTER — Ambulatory Visit: Payer: Medicare Other

## 2013-08-17 VITALS — BP 104/66 | HR 80 | Temp 98.2°F | Resp 18

## 2013-08-17 DIAGNOSIS — C549 Malignant neoplasm of corpus uteri, unspecified: Secondary | ICD-10-CM

## 2013-08-17 DIAGNOSIS — I4891 Unspecified atrial fibrillation: Secondary | ICD-10-CM

## 2013-08-17 DIAGNOSIS — D6481 Anemia due to antineoplastic chemotherapy: Secondary | ICD-10-CM

## 2013-08-17 DIAGNOSIS — C541 Malignant neoplasm of endometrium: Secondary | ICD-10-CM

## 2013-08-17 LAB — CBC WITH DIFFERENTIAL/PLATELET
BASO%: 0.2 % (ref 0.0–2.0)
EOS%: 0.4 % (ref 0.0–7.0)
HGB: 8.3 g/dL — ABNORMAL LOW (ref 11.6–15.9)
MCH: 30.4 pg (ref 25.1–34.0)
MCHC: 32.9 g/dL (ref 31.5–36.0)
MCV: 92.3 fL (ref 79.5–101.0)
MONO%: 12.4 % (ref 0.0–14.0)
RBC: 2.73 10*6/uL — ABNORMAL LOW (ref 3.70–5.45)
RDW: 17.2 % — ABNORMAL HIGH (ref 11.2–14.5)
lymph#: 2.5 10*3/uL (ref 0.9–3.3)
nRBC: 0 % (ref 0–0)

## 2013-08-17 MED ORDER — ACETAMINOPHEN 325 MG PO TABS
650.0000 mg | ORAL_TABLET | Freq: Once | ORAL | Status: AC
  Administered 2013-08-17: 650 mg via ORAL

## 2013-08-17 MED ORDER — HYDROCORTISONE ACE-PRAMOXINE 1-1 % RE FOAM: 1.0000 | Freq: Two times a day (BID) | RECTAL | Status: DC | PRN

## 2013-08-17 MED ORDER — SODIUM CHLORIDE 0.9 % IV SOLN: 250.0000 mL | Freq: Once | INTRAVENOUS | Status: DC

## 2013-08-17 NOTE — Progress Notes (Signed)
OFFICE PROGRESS NOTE   08/17/2013   Physicians:D.ClarkePearson, J.Kinard, P. KWIATKOWSKI, Megan Morris , C.Gessner   INTERVAL HISTORY:  Patient is seen in infusion area, where she is receiving PRBCs for symptomatic anemia; daughter will be back to pick her up and I spoke with her together with patient after initial visit today.  Patient missed appointment for lab and MD on 08-15-13, had CBC on 08-16-13 with Hgb 7.8 and ANC 0.6, and received neupogen on 08-16-13.   Cycle 4 taxol and carboplatin was given on 08-06-13, this delayed a week due to low counts, followed by neupogen on 8-26 and 8-29, then neupogen again on 9-4. Patient tells me that she has been very tired, but denies nausea or vomiting. She does walk in house for some exercise, is trying to eat and to drink fluids tho taste is not good. She notices some numbness in feet intermittently which is better with moving feet, no neuropathy in hands. She was not febrile with the low WBC. She denies SOB at rest. She continues xarelto for atrial fibrillation, is not aware of any bleeding. Peripheral access is more difficult, with 4 attempts today to start IV for PRBCs. As she has only 2 more treatments planned, she would rather continue peripheral access if possible.   ONCOLOGIC HISTORY Patient had been in usual good health when she developed several weeks of vaginal spotting in early 2014, without other symptoms. She had endometrial biopsy 03-02-2012 [phone]) with grade 1 endometroid adenocarcinoma. She was referred to Dr Yolande Jolly, his exam not remarkable otherwise. She had colonoscopy by Dr Leone Payor on 04-23-13, with a diminutive adenoma and otherwise benign. She had CXR preoperatively 04-20-2013 with chronic bronchitic and interstitial changes and degenerative changes of spine, otherwise not remarkable; I do not believe that she had other imaging of abdomen/ pelvis. She was taken to total robotic hysterectomy with BSO and bilateral pelvic node  evaluation by Dr Duard Brady on 04-24-2013. Post operative course was uncomplicated and she was discharged home on POD 1. Pathology [phone]) found high grade papillary serous carcinoma tumor size 3.5 cm with myometrial invasion 0.5 cm where myometrium was 1.5 cm, with focal angiolymphatic invasion, 6 right pelvic and 1 left pelvic node negative, for T1aN0 staging. She saw Dr Yolande Jolly in follow up on 05-09-13, with recommendation for 6 cycles of adjuvant taxol/ carboplatin followed by vaginal vault brachytherapy. Cycle 1 taxol carbo was 05-30-13, complicated by apparent steroid psychosis, severe aching and neutropenia. Cycle 2 was carboplatin only on 06-20-13; she was hospitalized 7-11 and 06-23-13 with dehydration and decreased blood pressure. Cycle 3 taxol at 135 mg/m2 + carboplatin was given on 07-11-13, with hospitalization 8-1 thru 07-17-13 for new atrial fibrillation with RVR, begun on xarelto. Cycle 4 taxol at 135 mg/m2 and carboplatin was delayed x 1 week for counts, given 08-06-13.   Review of systems as above, also: Per daughter, some diarrhea not watery on 8-30, seems improved now as she has had no BM during entire time at office today. Hemorrhoids bothersome, will start sitz baths in addition to proctofoam. No bleeding. No fever. No falls. No complaints of change in mental status. Remainder of 10 point Review of Systems negative.  Objective: Patient seen in infusion area with PRBCs infusing Vitals per infusion flow sheets Awake, alert, flat affect and slow responses as she has had, cooperative and pleasant. Total alopecia.   HEENT:PERRL, sclerae not icteric. Oral mucosa moist without lesions, posterior pharynx clear. Neck supple. Lymphatics no cervical or suraclavicular adenopathy Resp: clear to  auscultation bilaterally  Cardio:somewhat irregular RR GI: soft, nontender, normally active BS, not distended. Not able to evaluate hemorrhoids in infusion area Extremities: without pitting edema,  cords, tenderness. Peripheral IV RUE site ok. Neuro: no peripheral neuropathy apparent now.  Skin resolving small ecchymosis right upper arm from IV access in hospital. No rash, no petechiae.   Lab Results:  Results for orders placed in visit on 08/17/13  CBC WITH DIFFERENTIAL      Result Value Range   WBC 17.9 (*) 3.9 - 10.3 10e3/uL   NEUT# 13.0 (*) 1.5 - 6.5 10e3/uL   HGB 8.3 (*) 11.6 - 15.9 g/dL   HCT 95.6 (*) 21.3 - 08.6 %   Platelets 151  145 - 400 10e3/uL   MCV 92.3  79.5 - 101.0 fL   MCH 30.4  25.1 - 34.0 pg   MCHC 32.9  31.5 - 36.0 g/dL   RBC 5.78 (*) 4.69 - 6.29 10e6/uL   RDW 17.2 (*) 11.2 - 14.5 %   lymph# 2.5  0.9 - 3.3 10e3/uL   MONO# 2.2 (*) 0.1 - 0.9 10e3/uL   Eosinophils Absolute 0.1  0.0 - 0.5 10e3/uL   Basophils Absolute 0.0  0.0 - 0.1 10e3/uL   NEUT% 72.9  38.4 - 76.8 %   LYMPH% 14.1  14.0 - 49.7 %   MONO% 12.4  0.0 - 14.0 %   EOS% 0.4  0.0 - 7.0 %   BASO% 0.2  0.0 - 2.0 %   nRBC 0  0 - 0 %    CMET 08-16-13 noted  Studies/Results:  No results found.  Medications: I have reviewed the patient's current medications. She did not have oral premedication decadron with either cycle 3 or cycle 4 chemotherapy due to psychotic reaction cycle 1, tho she did receive decadron with IV premeds for those treatments.  Will refill proctofoam HC.  Assessment/Plan: 1. 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 Vaginal brachytherapy by Dr Roselind Messier planned, timing still to be determined, and I have requested another appointment with him. She is to see Dr Yolande Jolly again in Nov.  2.extreme agitation and paranoia after cycle 1 chemo, likely steroid exacerbation of previous tendencies. Bid haldol has been helpful. I will try to clarify what steroid premeds were used  with last cycle of treatment. 3.paroxysmal a fib with RVR: spontaneously converted during hospitalization 8-1 thru 07-17-13, begun on xarelto then. Need to watch platelets closely on the xarelto  4.HTN x years. back on antihypertensives after these were held for low BP after previous chemo  5.degenerative arthritis knees  6.allergic rhinitis  7.unremarkable colonoscopy and mammograms around time of cancer diagnosis        LIVESAY,LENNIS P, MD   08/17/2013, 1:26 PM

## 2013-08-17 NOTE — Patient Instructions (Addendum)
Blood Transfusion  A blood transfusion replaces your blood or some of its parts. Blood is replaced when you have lost blood because of surgery, an accident, or for severe blood conditions like anemia. You can donate blood to be used on yourself if you have a planned surgery. If you lose blood during that surgery, your own blood can be given back to you. Any blood given to you is checked to make sure it matches your blood type. Your temperature, blood pressure, and heart rate (vital signs) will be checked often.  GET HELP RIGHT AWAY IF:   You feel sick to your stomach (nauseous) or throw up (vomit).  You have watery poop (diarrhea).  You have shortness of breath or trouble breathing.  You have blood in your pee (urine) or have dark colored pee.  You have chest pain or tightness.  Your eyes or skin turn yellow (jaundice).  You have a temperature by mouth above 102 F (38.9 C), not controlled by medicine.  You start to shake and have chills.  You develop a a red rash (hives) or feel itchy.  You develop lightheadedness or feel confused.  You develop back, joint, or muscle pain.  You do not feel hungry (lost appetite).  You feel tired, restless, or nervous.  You develop belly (abdominal) cramps. Document Released: 02/25/2009 Document Revised: 02/21/2012 Document Reviewed: 02/25/2009 [employer] Patient Information 2014 [employer].  

## 2013-08-18 ENCOUNTER — Encounter: Payer: Self-pay | Admitting: Oncology

## 2013-08-18 LAB — TYPE AND SCREEN
Antibody Screen: NEGATIVE
Unit division: 0

## 2013-08-20 ENCOUNTER — Telehealth: Payer: Self-pay | Admitting: Oncology

## 2013-08-20 ENCOUNTER — Telehealth: Payer: Self-pay | Admitting: *Deleted

## 2013-08-20 ENCOUNTER — Other Ambulatory Visit: Payer: Self-pay | Admitting: Oncology

## 2013-08-20 ENCOUNTER — Telehealth: Payer: Self-pay

## 2013-08-20 DIAGNOSIS — C541 Malignant neoplasm of endometrium: Secondary | ICD-10-CM

## 2013-08-20 NOTE — Telephone Encounter (Signed)
Verified with daughter [name] that Ms. Vonbehren only receives the IV decadron on day of chemotherapy.  No oral steroid  Premeds. Ms. Dillon did receive IVF day after her treatment in cycle 3 and 4.  Dossie Der that Dr. Darrold Span would give IVF on 08-28-13 with neupogen.  Gave daughter the rest of the appointments.  She will call scheduling for the appointments for 08-28-13 on 9-11 o r9-12.

## 2013-08-20 NOTE — Telephone Encounter (Signed)
Message copied by Lorine Bears on Mon Aug 20, 2013  2:24 PM ------      Message from: Reece Packer      Created: Sat Aug 18, 2013  1:42 PM       Due cycle 5 chemo on 9-15.      Could you please clarify from daughter whether or not patient used any po decadron premed at home for most recent treatment on 8-25 -- we should do same thing for treatment on 9-15.      I cannot tell if she got IVF on day 2 last cycle, but if so she needs that again on 9-16 + I will give neupogen 9-16  if she is here for IVF. She needs apt and orders for IVF and neupogen if so.            thanks            [place] only ------

## 2013-08-20 NOTE — Telephone Encounter (Signed)
Ms. Julian daughter asked about the Cipro prescription she picked up yesterday.  Told her that was called in on 08-16-13 due to neutropenia.  Dtr. will keep prescription if needed again. Reviewed medication on 08-16-13 with patient and adult granddaughter.

## 2013-08-20 NOTE — Telephone Encounter (Signed)
lvm for pt regarding to all sept appts and Dr. Roselind Messier appt on 10.1.14 @ 9:30am

## 2013-08-20 NOTE — Telephone Encounter (Signed)
Per staff message and POF I have scheduled appts.  JMW  

## 2013-08-21 ENCOUNTER — Telehealth: Payer: Self-pay | Admitting: Oncology

## 2013-08-21 ENCOUNTER — Telehealth: Payer: Self-pay | Admitting: *Deleted

## 2013-08-21 NOTE — Telephone Encounter (Signed)
Per staff message and POF I have scheduled appts.  JMW  

## 2013-08-22 ENCOUNTER — Telehealth: Payer: Self-pay | Admitting: Oncology

## 2013-08-24 ENCOUNTER — Telehealth: Payer: Self-pay

## 2013-08-24 NOTE — Telephone Encounter (Signed)
Misty Alvarado called stating her mother had intense pain in her feet and lower legs. She had taken her mother out of the house on errands that morning. She gave her 2 norco and 1 ativan and her mother did not mention her leg pain again. Pt is also still having incontinent loose stools not watery. Misty Alvarado stated she told her mother she would call so she is following through on her promise. Emotional support given.

## 2013-08-26 ENCOUNTER — Other Ambulatory Visit: Payer: Self-pay | Admitting: Oncology

## 2013-08-27 ENCOUNTER — Telehealth: Payer: Self-pay

## 2013-08-27 ENCOUNTER — Other Ambulatory Visit: Payer: Self-pay | Admitting: Oncology

## 2013-08-27 ENCOUNTER — Other Ambulatory Visit: Payer: Self-pay | Admitting: Lab

## 2013-08-27 ENCOUNTER — Telehealth: Payer: Self-pay | Admitting: Oncology

## 2013-08-27 ENCOUNTER — Ambulatory Visit: Payer: Medicare Other

## 2013-08-27 ENCOUNTER — Telehealth: Payer: Self-pay | Admitting: *Deleted

## 2013-08-27 ENCOUNTER — Other Ambulatory Visit (HOSPITAL_BASED_OUTPATIENT_CLINIC_OR_DEPARTMENT_OTHER): Payer: Medicare Other | Admitting: Lab

## 2013-08-27 ENCOUNTER — Ambulatory Visit (HOSPITAL_BASED_OUTPATIENT_CLINIC_OR_DEPARTMENT_OTHER): Payer: Medicare Other | Admitting: Oncology

## 2013-08-27 DIAGNOSIS — C549 Malignant neoplasm of corpus uteri, unspecified: Secondary | ICD-10-CM

## 2013-08-27 DIAGNOSIS — C541 Malignant neoplasm of endometrium: Secondary | ICD-10-CM

## 2013-08-27 LAB — CBC WITH DIFFERENTIAL/PLATELET
BASO%: 1.1 % (ref 0.0–2.0)
EOS%: 0.4 % (ref 0.0–7.0)
LYMPH%: 27.5 % (ref 14.0–49.7)
MCH: 32 pg (ref 25.1–34.0)
MCHC: 34.4 g/dL (ref 31.5–36.0)
MCV: 93.1 fL (ref 79.5–101.0)
MONO#: 0.7 10*3/uL (ref 0.1–0.9)
MONO%: 14.6 % — ABNORMAL HIGH (ref 0.0–14.0)
NEUT%: 56.4 % (ref 38.4–76.8)
Platelets: 85 10*3/uL — ABNORMAL LOW (ref 145–400)
RBC: 3.09 10*6/uL — ABNORMAL LOW (ref 3.70–5.45)
WBC: 4.7 10*3/uL (ref 3.9–10.3)

## 2013-08-27 NOTE — Telephone Encounter (Signed)
, °

## 2013-08-27 NOTE — Progress Notes (Signed)
OFFICE PROGRESS NOTE   08/27/2013   Physicians:D.ClarkePearson, J.Kinard, P. KWIATKOWSKI, Megan Morris , C.Gessner   INTERVAL HISTORY:  Patient is seen in infusion area as a work in for MD now, as counts do not meet parameters for receiving cycle 5 carbo taxol today. Staff is trying to locate daughter at [facility] now. Patient complains of feeling tired as she has felt, denies bleeding or bruising. She states she ate egg, toast, coffee this AM, but taste is not good related to chemo including for fluids other than milk. She denies being lightheaded, moves carefully and slowly "I don't want to fall".   Per daughter communication with RN subsequently, patient is no longer on xarelto. She does not have PAC.  ONCOLOGIC HISTORY Patient had been in usual good health until several weeks of vaginal spotting in early 2014, without other symptoms. She had endometrial biopsy 03-02-2012 [phone]) with grade 1 endometroid adenocarcinoma. She was referred to Dr Yolande Jolly, his exam not remarkable otherwise. She had colonoscopy by Dr Leone Payor on 04-23-13, with a diminutive adenoma and otherwise benign. She had CXR preoperatively 04-20-2013 with chronic bronchitic and interstitial changes and degenerative changes of spine, otherwise not remarkable; I do not believe that she had other imaging of abdomen/ pelvis. She was taken to total robotic hysterectomy with BSO and bilateral pelvic node evaluation by Dr Duard Brady on 04-24-2013. Post operative course was uncomplicated and she was discharged home on POD 1. Pathology [phone]) found high grade papillary serous carcinoma tumor size 3.5 cm with myometrial invasion 0.5 cm where myometrium was 1.5 cm, with focal angiolymphatic invasion, 6 right pelvic and 1 left pelvic node negative, for T1aN0 staging. She saw Dr Yolande Jolly in follow up on 05-09-13, with recommendation for 6 cycles of adjuvant taxol/ carboplatin followed by vaginal vault brachytherapy. Cycle 1 taxol carbo  was 05-30-13, complicated by apparent steroid psychosis, severe aching and neutropenia. Cycle 2 was carboplatin only on 06-20-13; she was hospitalized 7-11 and 06-23-13 with dehydration and decreased blood pressure. Cycle 3 taxol at 135 mg/m2 + carboplatin was given on 07-11-13, with hospitalization 8-1 thru 07-17-13 for new atrial fibrillation with RVR, begun on xarelto. Cycle 4 delayed x 1 week for counts, given 08-06-13 with taxol at 135 mg/m2 and carbo dose reduced due to labs and weight.   Review of systems as above, also: No fever or symptoms of infection. Bruising at site of last IV left forearm. Incontinent of small amounts of stool several times daily, no large amounts of diarrhea. No LE swelling. Not SOB at rest. Remainder of 10 point Review of Systems negative.  Objective:  Vital signs in last 24 hours:  Vitals listed in infusion flow sheets, including BP 106/68, HR 78, temp 97.4, respirations 18 and not labored RA Alert and appropriate, but somewhat vague historian as is baseline. Affect not quite as flat today. Ambulatory slowly with assistance  Complete alopecia  HEENT:PERRL, sclerae not icteric. Oral mucosa moist without lesions, posterior pharynx clear.  Neck supple. No JVD.  Lymphatics no cervical,suraclavicular adenopathy Resp: clear to auscultation bilaterally  Cardio: regular rate and rhythm. No gallop. GI: soft, nontender, not distended, no mass or organomegaly. Surgical incision not remarkable. Some BS present. Extremities: without pitting edema, cords, tenderness Neuro: no peripheral neuropathy, otherwise nonfocal Skin without rash, ecchymosis, petechiae. No peripheral IV established due to counts too low to treat.   Lab Results: CBC today with WBC 4.7, ANC 2.7, Hgb 9.9, plt 85k   Studies/Results:  No results found.  Medications: I  have reviewed the patient's current medications as listed in EMR, which RN will also review with daughter (see comment above re no longer  on xarelto). She has norvasc and hyzaar listed as active medications, which I would like her to hold today due to systolic BP 106, and hold daily if systolic BP <120.  I understand that oral premed decadron was DCd by covering MD previously. Chemo orders and supportive care orders updated with situation now.  Assessment/Plan:  1. 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 I will see her 9-22 with labs and will give cycle 5 on 9-23 if counts have recovered; taxol will by at 135 mg/m2 and carbo decreased to AUC=4.She will need IVF on day 2, and will need at least 3 doses of neupogen. Vaginal brachytherapy by Dr Roselind Messier planned, timing still to be determined, return appointment to him 09-12-13. She is to see Dr Yolande Jolly again in Nov.  2.extreme agitation and paranoia after cycle 1 chemo, likely steroid exacerbation of previous tendencies. Bid haldol has been helpful.  3.paroxysmal a fib with RVR: spontaneously converted during hospitalization 8-1 thru 07-17-13, begun on xarelto then, but apparently no longer on this medication (which she would otherwise need to hold with the low platelets). 4.HTN x years. antihypertensives held for low BP after previous chemo and need to be held today and by parameters above. Patient aware that she should push fluids, thinks she can drink milk best. 5.degenerative arthritis knees  6.allergic rhinitis  7.unremarkable colonoscopy and mammograms around time of cancer diagnosis   RN spoke with daughter by cell phone after MD saw patient in infusion. Situation reviewed including low platelets, holding chemo today and BP meds as above. RN to be sure daughter is clear re next appointments.    Reece Packer, MD   08/27/2013, 12:47 PM

## 2013-08-27 NOTE — Progress Notes (Signed)
Labs reviewed by MD, pt evaluated by Dr. Darrold Span in the infusion room. No treatment or fluids today. Pt's daughter called by desk RN to advise and give further instructions.

## 2013-08-27 NOTE — Patient Instructions (Addendum)
Do not take norvasc or hyzaar if BP systolic is < 120. Do not take any BP medication tonight as BP is 106 systolic now. Push po fluids -- milk is fine since that tastes best to her.  Call if any bleeding, due to platelet count 85,000 today. Do not take xarelto rest of this week, until we recheck platelets on 9-22.   Hemoglobin is some better at 9.9 and white count is good at 4.7 with good infection fighting cells 2.7

## 2013-08-27 NOTE — Telephone Encounter (Signed)
Per staff message and POF I have scheduled appts.  JMW  

## 2013-08-27 NOTE — Telephone Encounter (Signed)
Spoke with daughter Misty Alvarado and told her that Misty Alvarado's  plts. are too low for for treatment today per Dr. Darrold Span. Dr. Darrold Span wants the Xarelto held with platlets low, however, daughter stated that it was stopped at last hospitalization.  Medication removed from list. Hold Norvasc or hyzaar if BP< 120 systolic. Bp today with 106 systolic so no bp meds. Push fluids- milk is fine. Misty Alvarado will receive chemotherapy 9-23- if counts ok on 09-03-13. Misty Alvarado scheduled return appt. for 09-03-13 with Dr. Darrold Span.

## 2013-08-28 ENCOUNTER — Ambulatory Visit: Payer: Self-pay

## 2013-08-28 ENCOUNTER — Encounter: Payer: Self-pay | Admitting: Oncology

## 2013-08-28 ENCOUNTER — Telehealth: Payer: Self-pay | Admitting: Oncology

## 2013-08-28 NOTE — Telephone Encounter (Signed)
, °

## 2013-08-31 ENCOUNTER — Other Ambulatory Visit: Payer: Self-pay | Admitting: Oncology

## 2013-08-31 ENCOUNTER — Telehealth: Payer: Self-pay | Admitting: Internal Medicine

## 2013-08-31 ENCOUNTER — Ambulatory Visit: Payer: Self-pay

## 2013-08-31 ENCOUNTER — Telehealth: Payer: Self-pay | Admitting: *Deleted

## 2013-08-31 DIAGNOSIS — R159 Full incontinence of feces: Secondary | ICD-10-CM

## 2013-08-31 DIAGNOSIS — R197 Diarrhea, unspecified: Secondary | ICD-10-CM

## 2013-08-31 DIAGNOSIS — C541 Malignant neoplasm of endometrium: Secondary | ICD-10-CM

## 2013-08-31 NOTE — Telephone Encounter (Signed)
Patient's daughter called requesting a prescription for lomotil. States she is giving max dose of imodium, but is not controlling diarrhea. Pt is drinking plenty of fluids and has not had any signs of bleeding. Daughter states pt "likes to go to ED on Fridays"

## 2013-08-31 NOTE — Progress Notes (Signed)
Medical Oncology  Spoke with daughter [name] by phone re "diarrhea" not controlled with imodium. [name] states significant volume of mushy stool daily, incontinent. [name] thinks amount precludes diarrhea around impaction; "does not smell like C diff" and patient otherwise stable. Patient had had MOM and Fleets at home previously. Will check stool for C diff, with order to be faxed to daughter at [phone] and stool specimen likely taken to [facility] lab. Will get abdominal Xray at [facility] prior to MD visit on 9-22 to see how much stool present. Will not change imodium to lomotil now. Daughter appreciated call. Ila Mcgill, MD

## 2013-08-31 NOTE — Telephone Encounter (Signed)
Patient's daughter reports diarrhea, several times a day with incontinence.  She is currently receiving chemo.  Her mother wants to be seen today.  I have advised the daughter that I do not have any openings until Monday.  She is advised that she should notify the cancer center of the problems she is having.  She will call me back if she wants an appt for Monday.  Her mother has an appt with Dr. Darrold Span on Monday am.  She is advised to call back if she needs an appt for next week.

## 2013-09-01 ENCOUNTER — Ambulatory Visit: Payer: Self-pay

## 2013-09-03 ENCOUNTER — Ambulatory Visit (HOSPITAL_COMMUNITY)
Admission: RE | Admit: 2013-09-03 | Discharge: 2013-09-03 | Disposition: A | Discharge status: Home / Self Care | Payer: Medicare Other | Source: Ambulatory Visit | Attending: Oncology | Admitting: Oncology

## 2013-09-03 ENCOUNTER — Telehealth: Payer: Self-pay | Admitting: *Deleted

## 2013-09-03 ENCOUNTER — Ambulatory Visit (HOSPITAL_BASED_OUTPATIENT_CLINIC_OR_DEPARTMENT_OTHER): Payer: Medicare Other | Admitting: Oncology

## 2013-09-03 ENCOUNTER — Other Ambulatory Visit (HOSPITAL_BASED_OUTPATIENT_CLINIC_OR_DEPARTMENT_OTHER): Payer: Medicare Other | Admitting: Lab

## 2013-09-03 ENCOUNTER — Ambulatory Visit: Payer: Self-pay

## 2013-09-03 ENCOUNTER — Other Ambulatory Visit: Payer: Self-pay

## 2013-09-03 ENCOUNTER — Encounter: Payer: Self-pay | Admitting: Oncology

## 2013-09-03 VITALS — BP 131/79 | HR 84 | Temp 97.9°F | Resp 18 | Ht 66.0 in | Wt 175.0 lb

## 2013-09-03 DIAGNOSIS — C541 Malignant neoplasm of endometrium: Secondary | ICD-10-CM

## 2013-09-03 DIAGNOSIS — C549 Malignant neoplasm of corpus uteri, unspecified: Secondary | ICD-10-CM

## 2013-09-03 DIAGNOSIS — R159 Full incontinence of feces: Secondary | ICD-10-CM | POA: Insufficient documentation

## 2013-09-03 DIAGNOSIS — R197 Diarrhea, unspecified: Secondary | ICD-10-CM | POA: Insufficient documentation

## 2013-09-03 LAB — CBC WITH DIFFERENTIAL/PLATELET
Basophils Absolute: 0 10*3/uL (ref 0.0–0.1)
EOS%: 2.4 % (ref 0.0–7.0)
HCT: 27.1 % — ABNORMAL LOW (ref 34.8–46.6)
HGB: 9.4 g/dL — ABNORMAL LOW (ref 11.6–15.9)
MCH: 32.7 pg (ref 25.1–34.0)
MCV: 94.4 fL (ref 79.5–101.0)
MONO%: 10.3 % (ref 0.0–14.0)
NEUT%: 53.9 % (ref 38.4–76.8)

## 2013-09-03 LAB — COMPREHENSIVE METABOLIC PANEL (CC13)
AST: 22 U/L (ref 5–34)
Albumin: 3.3 g/dL — ABNORMAL LOW (ref 3.5–5.0)
Alkaline Phosphatase: 54 U/L (ref 40–150)
BUN: 14.4 mg/dL (ref 7.0–26.0)
Creatinine: 1.1 mg/dL (ref 0.6–1.1)
Potassium: 4 mEq/L (ref 3.5–5.1)

## 2013-09-03 MED ORDER — HYDROCODONE-ACETAMINOPHEN 10-325 MG PO TABS: 1.0000 | ORAL_TABLET | Freq: Four times a day (QID) | ORAL | Status: DC | PRN

## 2013-09-03 MED ORDER — HYDROCODONE-ACETAMINOPHEN 10-325 MG PO TABS: 1.0000 | ORAL_TABLET | Freq: Two times a day (BID) | ORAL | Status: DC | PRN

## 2013-09-03 NOTE — Telephone Encounter (Signed)
appts made and printed...td 

## 2013-09-03 NOTE — Progress Notes (Signed)
OFFICE PROGRESS NOTE   09/03/2013   Physicians:D.ClarkePearson, J.Kinard, P. KWIATKOWSKI, Megan Morris , C.Gessner   INTERVAL HISTORY:  Patient is seen, together with daughter Misty Alvarado, in continuing attention to her T1aN0 high grade serous endometrial carcinoma for which she continues adjuvant chemotherapy with taxol and carboplatin, cycle 4 on 08-06-13 with neupogen. She is having problems with bowels and ANC is low for treatment tomorrow; we will reschedule #5 chemo for 9-25 due to these issues and daughter's work schedule. She will have IVF and neupogen on 9-26, and neupogen on 9-27 and 9-29. Patient and daughter have reported "diarrhea" for past couple of weeks, which is more accurately fecal incontinence. Abdominal Xray today shows significant stool and otherwise normal bowel gas pattern. They did not get specimen for C diff ( history does not seem too suspicious for this). She will not use any more imodium, will try MOM ~ bid and try to get bowels to move very well in next 1-2 days. Appetite has been some better over this past weekend. She feels better off of hyzaar and norvasc past weej, with better BP today.   She does not have PAC.    ONCOLOGIC HISTORY Patient had been in usual good health until several weeks of vaginal spotting in early 2014, without other symptoms. She had endometrial biopsy 03-02-2012 [phone]) with grade 1 endometroid adenocarcinoma. She was referred to Dr Misty Alvarado, his exam not remarkable otherwise. She had colonoscopy by Dr Leone Payor on 04-23-13, with a diminutive adenoma and otherwise benign. She had CXR preoperatively 04-20-2013 with chronic bronchitic and interstitial changes and degenerative changes of spine, otherwise not remarkable; I do not believe that she had other imaging of abdomen/ pelvis. She was taken to total robotic hysterectomy with BSO and bilateral pelvic node evaluation by Dr Duard Brady on 04-24-2013. Post operative course was uncomplicated and she was  discharged home on POD 1. Pathology [phone]) found high grade papillary serous carcinoma tumor size 3.5 cm with myometrial invasion 0.5 cm where myometrium was 1.5 cm, with focal angiolymphatic invasion, 6 right pelvic and 1 left pelvic node negative, for T1aN0 staging. She saw Dr Misty Alvarado in follow up on 05-09-13, with recommendation for 6 cycles of adjuvant taxol/ carboplatin followed by vaginal vault brachytherapy. Cycle 1 taxol carbo was 05-30-13, complicated by apparent steroid psychosis, severe aching and neutropenia. Cycle 2 was carboplatin only on 06-20-13; she was hospitalized 7-11 and 06-23-13 with dehydration and decreased blood pressure. Cycle 3 taxol at 135 mg/m2 + carboplatin was given on 07-11-13, with hospitalization 8-1 thru 07-17-13 for new atrial fibrillation with RVR, begun on xarelto. Cycle 4 delayed x 1 week for counts, given 08-06-13 with taxol at 135 mg/m2 and carbo dose reduced due to labs.   Review of systems as above, also: No fever or symptoms of infection. Using hydrocodone usually 2 tablets bid for general mild aches - we have discussed this and I have encouraged her to use least possible to control actual symptoms. No bleeding. No chest pain. No vomiting. No LE swelling. No reported concerns re agitation or paranoia, continuing regular haldol. Remainder of 10 point Review of Systems negative.  Objective:  Vital signs in last 24 hours:  BP 131/79  Pulse 84  Temp(Src) 97.9 F (36.6 C) (Oral)  Resp 18  Ht 5\' 6"  (1.676 m)  Wt 175 lb (79.379 kg)  BMI 28.26 kg/m2 weight was 187 on 08-02-13  Alert, oriented to person, place situation. Affect still flat and slow to respond as she has been.  In [facility] at office. Alopecia  HEENT:PERRL, sclerae not icteric. Oral mucosa moist without lesions, posterior pharynx clear.  Neck supple. No JVD.  Lymphatics:no cervical,suraclavicular adenopathy Resp: clear to auscultation bilaterally and normal percussion bilaterally Cardio: regular  rate and rhythm. No gallop. GI: soft, nontender, not distended, no mass or organomegaly.  Bowel sounds present but diminished.. Surgical incision not remarkable. Musculoskeletal/ Extremities: without pitting edema, cords, tenderness Neuro: no peripheral neuropathy. Otherwise nonfocal Skin without rash, ecchymosis, petechiae   Lab Results:  Results for orders placed in visit on 10-Sep-2013  COMPREHENSIVE METABOLIC PANEL (CC13)      Result Value Range   Sodium 139  136 - 145 mEq/L   Potassium 4.0  3.5 - 5.1 mEq/L   Chloride 106  98 - 109 mEq/L   CO2 23  22 - 29 mEq/L   Glucose 104  70 - 140 mg/dl   BUN 16.1  7.0 - 09.6 mg/dL   Creatinine 1.1  0.6 - 1.1 mg/dL   Total Bilirubin 0.45  0.20 - 1.20 mg/dL   Alkaline Phosphatase 54  40 - 150 U/L   AST 22  5 - 34 U/L   ALT 9  0 - 55 U/L   Total Protein 6.6  6.4 - 8.3 g/dL   Albumin 3.3 (*) 3.5 - 5.0 g/dL   Calcium 9.4  8.4 - 40.9 mg/dL  CBC WITH DIFFERENTIAL      Result Value Range   WBC 2.5 (*) 3.9 - 10.3 10e3/uL   NEUT# 1.3 (*) 1.5 - 6.5 10e3/uL   HGB 9.4 (*) 11.6 - 15.9 g/dL   HCT 81.1 (*) 91.4 - 78.2 %   Platelets 256  145 - 400 10e3/uL   MCV 94.4  79.5 - 101.0 fL   MCH 32.7  25.1 - 34.0 pg   MCHC 34.7  31.5 - 36.0 g/dL   RBC 9.56 (*) 2.13 - 0.86 10e6/uL   RDW 18.2 (*) 11.2 - 14.5 %   lymph# 0.8 (*) 0.9 - 3.3 10e3/uL   MONO# 0.3  0.1 - 0.9 10e3/uL   Eosinophils Absolute 0.1  0.0 - 0.5 10e3/uL   Basophils Absolute 0.0  0.0 - 0.1 10e3/uL   NEUT% 53.9  38.4 - 76.8 %   LYMPH% 32.4  14.0 - 49.7 %   MONO% 10.3  0.0 - 14.0 %   EOS% 2.4  0.0 - 7.0 %   BASO% 1.0  0.0 - 2.0 %     Studies/Results:  Dg Abd 1 View  2013/09/10   CLINICAL DATA:  Diarrhea, fecal incontinence  EXAM: ABDOMEN - 1 VIEW  COMPARISON:  None  FINDINGS: Scattered stool throughout colon, mildly prominent in rectum.  Nonobstructive bowel gas pattern.  No bowel dilatation or bowel wall thickening.  Bones demineralized.  No definite urinary tract calcification.   IMPRESSION: Increased stool in rectum.   Electronically Signed   By: Ulyses Southward M.D.   On: September 10, 2013 08:42   I have reviewed images with daughter and patient - in addition to above, she has significant stool obvious in right colon.  Medications: I have reviewed the patient's current medications. She will decrease hydrocodone as possible. She will not use further imodium. She will use MOM at least bid until bowels move well. She will continue haldol and will hold BP meds unless BP >=~ 145/85 at least until chemo completes.  DISCUSSION: Hopefully the fecal incontinence will improve if bowels get cleaned out better over next couple of days, which should  also allow some further increase in ANC. We will treat on 9-25 if ANC >=1.5 and plt >=100k (as daughter is off from work that day).  Assessment/Plan: 1. 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 She will need IVF on day 2, and will need at least 3 doses of neupogen. Vaginal brachytherapy by Dr Roselind Messier planned, timing still to be determined, return appointment to him 09-12-13. She is to see Dr Misty Alvarado again in Nov.  2.extreme agitation and paranoia after cycle 1 chemo, likely steroid exacerbation of previous tendencies. Bid haldol has been helpful.  3.paroxysmal a fib with RVR: spontaneously converted during hospitalization 8-1 thru 07-17-13,xarelto not continued after DC from hospital peer history from daughter now. She does not seem to be in a fib by exam now. 4.HTN x years. antihypertensives held for low BP last week, symptoms improved 5.degenerative arthritis knees  6.allergic rhinitis  7.unremarkable colonoscopy and mammograms around time of cancer diagnosis 8. No flu shot yet, which she should have at least after cycle 6 chemo  I will see her again in ~ 2 weeks or sooner if needed. Patient and daughter are in agreement  with plan.      Misty Alvarado P, MD   09/03/2013, 11:58 AM

## 2013-09-03 NOTE — Patient Instructions (Signed)
Stay off of norvasc and hyzaar. Check BP at home and let MD know if >=145/85 while still on chemo.  Stop imodium. Take MOM next 2 days to really get bowels cleaned out. Drink lots of fluids with this

## 2013-09-04 ENCOUNTER — Ambulatory Visit: Payer: Self-pay

## 2013-09-05 ENCOUNTER — Ambulatory Visit: Payer: Self-pay

## 2013-09-06 ENCOUNTER — Other Ambulatory Visit (HOSPITAL_BASED_OUTPATIENT_CLINIC_OR_DEPARTMENT_OTHER): Payer: Medicare Other | Admitting: Lab

## 2013-09-06 ENCOUNTER — Ambulatory Visit: Payer: Self-pay

## 2013-09-06 ENCOUNTER — Ambulatory Visit (HOSPITAL_BASED_OUTPATIENT_CLINIC_OR_DEPARTMENT_OTHER): Payer: Medicare Other

## 2013-09-06 ENCOUNTER — Telehealth: Payer: Self-pay

## 2013-09-06 VITALS — BP 99/66 | HR 69 | Temp 97.3°F | Resp 19 | Ht 66.0 in

## 2013-09-06 DIAGNOSIS — C541 Malignant neoplasm of endometrium: Secondary | ICD-10-CM

## 2013-09-06 DIAGNOSIS — Z5111 Encounter for antineoplastic chemotherapy: Secondary | ICD-10-CM

## 2013-09-06 DIAGNOSIS — C549 Malignant neoplasm of corpus uteri, unspecified: Secondary | ICD-10-CM

## 2013-09-06 LAB — CBC WITH DIFFERENTIAL/PLATELET
BASO%: 0.2 % (ref 0.0–2.0)
Basophils Absolute: 0 10*3/uL (ref 0.0–0.1)
EOS%: 4.5 % (ref 0.0–7.0)
Eosinophils Absolute: 0.2 10*3/uL (ref 0.0–0.5)
HCT: 27.5 % — ABNORMAL LOW (ref 34.8–46.6)
HGB: 9.2 g/dL — ABNORMAL LOW (ref 11.6–15.9)
LYMPH%: 31 % (ref 14.0–49.7)
MCH: 31.8 pg (ref 25.1–34.0)
MCHC: 33.5 g/dL (ref 31.5–36.0)
NEUT%: 47.9 % (ref 38.4–76.8)
Platelets: 289 10*3/uL (ref 145–400)
lymph#: 1.3 10*3/uL (ref 0.9–3.3)

## 2013-09-06 MED ORDER — SODIUM CHLORIDE 0.9 % IV SOLN
318.0000 mg | Freq: Once | INTRAVENOUS | Status: AC
  Administered 2013-09-06: 320 mg via INTRAVENOUS
  Filled 2013-09-06: qty 32

## 2013-09-06 MED ORDER — LORAZEPAM 1 MG PO TABS
ORAL_TABLET | ORAL | Status: AC
  Filled 2013-09-06: qty 1

## 2013-09-06 MED ORDER — DEXAMETHASONE SODIUM PHOSPHATE 20 MG/5ML IJ SOLN
20.0000 mg | Freq: Once | INTRAMUSCULAR | Status: AC
  Administered 2013-09-06: 20 mg via INTRAVENOUS

## 2013-09-06 MED ORDER — ONDANSETRON 16 MG/50ML IVPB (CHCC)
16.0000 mg | Freq: Once | INTRAVENOUS | Status: AC
  Administered 2013-09-06: 16 mg via INTRAVENOUS

## 2013-09-06 MED ORDER — FAMOTIDINE IN NACL 20-0.9 MG/50ML-% IV SOLN
20.0000 mg | Freq: Once | INTRAVENOUS | Status: AC
  Administered 2013-09-06: 20 mg via INTRAVENOUS

## 2013-09-06 MED ORDER — SODIUM CHLORIDE 0.9 % IV SOLN
Freq: Once | INTRAVENOUS | Status: AC
  Administered 2013-09-06: 14:00:00 via INTRAVENOUS

## 2013-09-06 MED ORDER — LORAZEPAM 1 MG PO TABS
1.0000 mg | ORAL_TABLET | Freq: Once | ORAL | Status: AC | PRN
  Administered 2013-09-06: 1 mg via ORAL

## 2013-09-06 MED ORDER — DIPHENHYDRAMINE HCL 50 MG/ML IJ SOLN
25.0000 mg | Freq: Once | INTRAMUSCULAR | Status: AC
  Administered 2013-09-06: 25 mg via INTRAVENOUS

## 2013-09-06 MED ORDER — ONDANSETRON 16 MG/50ML IVPB (CHCC)
INTRAVENOUS | Status: AC
  Filled 2013-09-06: qty 16

## 2013-09-06 MED ORDER — DEXAMETHASONE SODIUM PHOSPHATE 20 MG/5ML IJ SOLN
INTRAMUSCULAR | Status: AC
  Filled 2013-09-06: qty 5

## 2013-09-06 MED ORDER — FAMOTIDINE IN NACL 20-0.9 MG/50ML-% IV SOLN
INTRAVENOUS | Status: AC
  Filled 2013-09-06: qty 50

## 2013-09-06 MED ORDER — DIPHENHYDRAMINE HCL 50 MG/ML IJ SOLN
INTRAMUSCULAR | Status: AC
  Filled 2013-09-06: qty 1

## 2013-09-06 MED ORDER — PACLITAXEL CHEMO INJECTION 300 MG/50ML
135.0000 mg/m2 | Freq: Once | INTRAVENOUS | Status: AC
  Administered 2013-09-06: 282 mg via INTRAVENOUS
  Filled 2013-09-06: qty 47

## 2013-09-06 NOTE — Patient Instructions (Addendum)
[facility] Discharge Instructions for Patients Receiving Chemotherapy  Today you received the following chemotherapy agents Taxol and Carboplatin  To help prevent nausea and vomiting after your treatment, we encourage you to take your nausea medication Zofran 8 mg take 1-2 tablets every 12 hours as needed for nausea.  If you develop nausea and vomiting that is not controlled by your nausea medication, call the clinic.   BELOW ARE SYMPTOMS THAT SHOULD BE REPORTED IMMEDIATELY:  *FEVER GREATER THAN 100.5 F  *CHILLS WITH OR WITHOUT FEVER  NAUSEA AND VOMITING THAT IS NOT CONTROLLED WITH YOUR NAUSEA MEDICATION  *UNUSUAL SHORTNESS OF BREATH  *UNUSUAL BRUISING OR BLEEDING  TENDERNESS IN MOUTH AND THROAT WITH OR WITHOUT PRESENCE OF ULCERS  *URINARY PROBLEMS  *BOWEL PROBLEMS  UNUSUAL RASH Items with * indicate a potential emergency and should be followed up as soon as possible.  Feel free to call the clinic you have any questions or concerns. The clinic phone number is [phone].

## 2013-09-06 NOTE — Telephone Encounter (Signed)
Ms. Alfredo Bach called to see if Dr. Darrold Span would call in a Lidocaine suppository for her mother's hemorrhoids as the proctofoam is not sufficient for hder mother's hemorrhoids. Suggested that daughter call Ms. Huntoon's PCP Dr. Amador Cunas as Dr. Darrold Span not in the office until 09-10-13.  Ms. Alfredo Bach stated that she will call PCP.

## 2013-09-07 ENCOUNTER — Ambulatory Visit: Payer: Medicare Other

## 2013-09-07 ENCOUNTER — Ambulatory Visit (HOSPITAL_BASED_OUTPATIENT_CLINIC_OR_DEPARTMENT_OTHER): Payer: Medicare Other

## 2013-09-07 ENCOUNTER — Ambulatory Visit: Payer: Medicare Other | Admitting: Gynecology

## 2013-09-07 ENCOUNTER — Other Ambulatory Visit: Payer: Self-pay | Admitting: Gynecologic Oncology

## 2013-09-07 VITALS — BP 118/72 | HR 92 | Temp 97.0°F | Resp 20

## 2013-09-07 DIAGNOSIS — C541 Malignant neoplasm of endometrium: Secondary | ICD-10-CM

## 2013-09-07 DIAGNOSIS — Z5189 Encounter for other specified aftercare: Secondary | ICD-10-CM

## 2013-09-07 DIAGNOSIS — C549 Malignant neoplasm of corpus uteri, unspecified: Secondary | ICD-10-CM

## 2013-09-07 DIAGNOSIS — R197 Diarrhea, unspecified: Secondary | ICD-10-CM

## 2013-09-07 MED ORDER — FILGRASTIM 300 MCG/0.5ML IJ SOLN
300.0000 ug | Freq: Once | INTRAMUSCULAR | Status: AC
  Administered 2013-09-07: 300 ug via SUBCUTANEOUS
  Filled 2013-09-07: qty 0.5

## 2013-09-07 MED ORDER — SODIUM CHLORIDE 0.9 % IV SOLN
INTRAVENOUS | Status: DC
  Administered 2013-09-07: 12:00:00 via INTRAVENOUS

## 2013-09-07 NOTE — Patient Instructions (Addendum)
Filgrastim, G-CSF injection What is this medicine? FILGRASTIM, G-CSF (fil GRA stim) stimulates the formation of white blood cells. This medicine is given to patients with conditions that may cause a decrease in white blood cells, like those receiving certain types of chemotherapy or bone marrow transplant. It helps the bone marrow recover its ability to produce white blood cells. Increasing the amount of white blood cells helps to decrease the risk of infection and fever. This medicine may be used for other purposes; ask your health care provider or pharmacist if you have questions. What should I tell my health care provider before I take this medicine? They need to know if you have any of these conditions: -currently receiving radiation therapy -sickle cell disease -an unusual or allergic reaction to filgrastim, E. coli protein, other medicines, foods, dyes, or preservatives -pregnant or trying to get pregnant -breast-feeding How should I use this medicine? This medicine is for injection into a vein or injection under the skin. It is usually given by a health care professional in a hospital or clinic setting. If you get this medicine at home, you will be taught how to prepare and give this medicine. Always change the site for the injection under the skin. Let the solution warm to room temperature before you use it. Do not shake the solution before you withdraw a dose. Throw away any unused portion. Use exactly as directed. Take your medicine at regular intervals. Do not take your medicine more often than directed. It is important that you put your used needles and syringes in a special sharps container. Do not put them in a trash can. If you do not have a sharps container, call your pharmacist or healthcare provider to get one. Talk to your pediatrician regarding the use of this medicine in children. While this medicine may be prescribed for children for selected conditions, precautions do  apply. Overdosage: If you think you have taken too much of this medicine contact a poison control center or emergency room at once. NOTE: This medicine is only for you. Do not share this medicine with others. What if I miss a dose? Try not to miss doses. If you miss a dose take the dose as soon as you remember. If it is almost time for the next dose, do not take double doses unless told to by your doctor or health care professional. What may interact with this medicine? -lithium -medicines for cancer chemotherapy This list may not describe all possible interactions. Give your health care provider a list of all the medicines, herbs, non-prescription drugs, or dietary supplements you use. Also tell them if you smoke, drink alcohol, or use illegal drugs. Some items may interact with your medicine. What should I watch for while using this medicine? Visit your doctor or health care professional for regular checks on your progress. If you get a fever or any sign of infection while you are using this medicine, do not treat yourself. Check with your doctor or health care professional. Bone pain can usually be relieved by mild pain relievers such as acetaminophen or ibuprofen. Check with your doctor or health care professional before taking these medicines as they may hide a fever. Call your doctor or health care professional if the aches and pains are severe or do not go away. What side effects may I notice from receiving this medicine? Side effects that you should report to your doctor or health care professional as soon as possible: -allergic reactions like skin rash, itching   or hives, swelling of the face, lips, or tongue -difficulty breathing, wheezing -fever -pain, redness, or swelling at the injection site -stomach or side pain, or pain at the shoulder Side effects that usually do not require medical attention (report to your doctor or health care professional if they continue or are  bothersome): -bone pain (ribs, lower back, breast bone) -headache -skin rash This list may not describe all possible side effects. Call your doctor for medical advice about side effects. You may report side effects to FDA at 1-800-FDA-1088. Where should I keep my medicine? Keep out of the reach of children. Store in a refrigerator between 2 and 8 degrees C (36 and 46 degrees F). Do not freeze or leave in direct sunlight. If vials or syringes are left out of the refrigerator for more than 24 hours, they must be thrown away. Throw away unused vials after the expiration date on the carton. NOTE: This sheet is a summary. It may not cover all possible information. If you have questions about this medicine, talk to your doctor, pharmacist, or health care provider.  2013, [employer]. (02/14/2008 1:33:21 PM)  

## 2013-09-07 NOTE — Progress Notes (Signed)
Patient received neupogen in the infusion room with IVF.

## 2013-09-08 ENCOUNTER — Ambulatory Visit (HOSPITAL_BASED_OUTPATIENT_CLINIC_OR_DEPARTMENT_OTHER): Payer: Medicare Other

## 2013-09-08 VITALS — BP 141/79 | HR 89 | Temp 97.0°F | Resp 19

## 2013-09-08 DIAGNOSIS — C549 Malignant neoplasm of corpus uteri, unspecified: Secondary | ICD-10-CM

## 2013-09-08 DIAGNOSIS — Z5189 Encounter for other specified aftercare: Secondary | ICD-10-CM

## 2013-09-08 DIAGNOSIS — C541 Malignant neoplasm of endometrium: Secondary | ICD-10-CM

## 2013-09-08 MED ORDER — FILGRASTIM 300 MCG/0.5ML IJ SOLN
300.0000 ug | Freq: Once | INTRAMUSCULAR | Status: AC
  Administered 2013-09-08: 300 ug via SUBCUTANEOUS

## 2013-09-09 ENCOUNTER — Other Ambulatory Visit: Payer: Self-pay | Admitting: Oncology

## 2013-09-10 ENCOUNTER — Ambulatory Visit (HOSPITAL_BASED_OUTPATIENT_CLINIC_OR_DEPARTMENT_OTHER): Payer: Medicare Other

## 2013-09-10 VITALS — BP 143/84 | HR 109 | Temp 98.3°F

## 2013-09-10 DIAGNOSIS — C549 Malignant neoplasm of corpus uteri, unspecified: Secondary | ICD-10-CM

## 2013-09-10 DIAGNOSIS — Z5189 Encounter for other specified aftercare: Secondary | ICD-10-CM

## 2013-09-10 DIAGNOSIS — C541 Malignant neoplasm of endometrium: Secondary | ICD-10-CM

## 2013-09-10 MED ORDER — FILGRASTIM 300 MCG/0.5ML IJ SOLN
300.0000 ug | Freq: Once | INTRAMUSCULAR | Status: AC
  Administered 2013-09-10: 300 ug via SUBCUTANEOUS
  Filled 2013-09-10: qty 0.5

## 2013-09-11 ENCOUNTER — Other Ambulatory Visit: Payer: Self-pay | Admitting: *Deleted

## 2013-09-11 DIAGNOSIS — C541 Malignant neoplasm of endometrium: Secondary | ICD-10-CM

## 2013-09-11 MED ORDER — LORAZEPAM 0.5 MG PO TABS: ORAL_TABLET | ORAL | Status: DC

## 2013-09-11 NOTE — Progress Notes (Signed)
GYN Location of Tumor / Histology: endometrium   Patient presented in early 2014 with symptoms of: post menopausal bleeding for several weeks  Biopsies of endometrium (if applicable) revealed: endometroid adenocarcinoma   Surgical Path  Accession #: [id number]  Collected Date: 04/24/2013 Received Date: 05/13/2014INALDIAGNOSIS  Diagnosis  1. Uterus, ovaries and fallopian tubes, with cervix  - PAPILLARY SEROUS CARCINOMA, INVADING INTO UNDERLYING MYOMETRIUM WITH FOCAL  ANGIOLYMPHATIC PRESENT.  - CERVIX: SQUAMOUS MUCOSA AND ENDOCERVICAL MUCOSA, NO DYSPLASIA OR  MALIGNANCY.  - BILATERAL FALLOPIAN TUBES AND OVARIES: NO PATHOLOGIC ABNORMALITIES.  2. Lymph nodes, regional resection, right pelvic  - SIX LYMPH NODE, NEGATIVE FOR METASTATIC CARCINOMA (0/6).  3. Lymph nodes, regional resection, left pelvic  - ONE LYMPH NODE AND MATURE ADIPOSE TISSUE, NO EVIDENCE OF MALIGNANCY (0/1).   Past/Anticipated interventions by Gyn/Onc surgery, if any: 04/24/13 robotic hysterectomy, BSO, pelvic lymphadenectomy   Past/Anticipated interventions by medical oncology, if any: adjuvant chemotherapy- q3 week Carbo/Taxol, 1st chemo 05/30/13, had cycle 5 on 09/06/2013, she has once more cycle.   Weight changes, if any: lost 40 lbs since May, 2014.   Bowel/Bladder complaints, if any: diarrhea, fecal incontinence   Nausea/Vomiting, if any: none   Pain issues, if any: pain in rectum that comes and goes. She is rating it a 6/10 today. She is taking 4-5 Norco's per day and she says it does not help much.   SAFETY ISSUES:  Prior radiation? no  Pacemaker/ICD? no  Possible current pregnancy? na  Is the patient on methotrexate? 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 Patient states she does not feel good today. She is very weak and tired.       

## 2013-09-12 ENCOUNTER — Ambulatory Visit
Admission: RE | Admit: 2013-09-12 | Discharge: 2013-09-12 | Disposition: A | Discharge status: Home / Self Care | Payer: Medicare Other | Source: Ambulatory Visit | Attending: Radiation Oncology | Admitting: Radiation Oncology

## 2013-09-12 ENCOUNTER — Encounter: Payer: Self-pay | Admitting: Radiation Oncology

## 2013-09-12 VITALS — BP 147/71 | HR 102 | Temp 97.8°F | Ht 66.0 in | Wt 173.9 lb

## 2013-09-12 DIAGNOSIS — C541 Malignant neoplasm of endometrium: Secondary | ICD-10-CM

## 2013-09-12 DIAGNOSIS — R159 Full incontinence of feces: Secondary | ICD-10-CM | POA: Insufficient documentation

## 2013-09-12 DIAGNOSIS — R197 Diarrhea, unspecified: Secondary | ICD-10-CM | POA: Insufficient documentation

## 2013-09-12 DIAGNOSIS — Z79899 Other long term (current) drug therapy: Secondary | ICD-10-CM | POA: Insufficient documentation

## 2013-09-12 DIAGNOSIS — D709 Neutropenia, unspecified: Secondary | ICD-10-CM | POA: Insufficient documentation

## 2013-09-12 DIAGNOSIS — C549 Malignant neoplasm of corpus uteri, unspecified: Secondary | ICD-10-CM | POA: Insufficient documentation

## 2013-09-12 NOTE — Progress Notes (Signed)
Radiation Oncology         [phone] ________________________________  Name: Misty Alvarado MRN: [id number]  Date: 09/12/2013  DOB: 01/02/1933  Reevaluation note  CC: Rogelia Boga, MD  Reece Packer, MD  Diagnosis: Stage T1a N0 high grade serous endometrial carcinoma with lymphovascular space invasion    Narrative:  The patient returns today for further evaluation.   the patient was initially seen in consultation 06/06/2013.  Since that initial evaluation the patient has proceeded with adjuvant chemotherapy consisting of Taxol and carboplatinum.   the patient recently completed her fifth cycle of 6 planned cycles. Patient has had some problems with diarrhea/fecal incontinence as well as problems with neutropenia. The patient is tentatively scheduled for her last cycle in late October. Patient is now seen in radiation oncology to coordinate her planned vaginal vault brachytherapy treatments. In light of the patient's performance status and issues as above,  I would recommend waiting on this treatment until she has completed her planned course of adjuvant chemotherapy.                            ALLERGIES:  is allergic to dexamethasone and rocephin.  Meds: Current Outpatient Prescriptions  Medication Sig Dispense Refill  . haloperidol (HALDOL) 1 MG tablet Take 1 mg by mouth 2 (two) times daily. 8:00 am and 8:00 pm      . HYDROcodone-acetaminophen (NORCO) 10-325 MG per tablet Take 1-2 tablets by mouth 2 (two) times daily as needed for pain.  90 tablet  0  . hydrocortisone-pramoxine (PROCTOFOAM-HC) rectal foam Place 1 applicator rectally 2 (two) times daily as needed for hemorrhoids.  10 g  1  . LORazepam (ATIVAN) 0.5 MG tablet Take 1-2 tabs  under the tongue or swallow  Every 6 hrs. As needed for nausea.  Will make you drowsy  20 tablet  0  . mometasone (NASONEX) 50 MCG/ACT nasal spray Place 2 sprays into the nose daily as needed (for allergies).      . ondansetron (ZOFRAN)  8 MG tablet Take 1-2 tablets (8-16 mg total) by mouth every 12 (twelve) hours as needed for nausea (Will not make you drowsy).  20 tablet  2   No current facility-administered medications for this encounter.    Physical Findings: The patient is in no acute distress. Patient is alert and oriented.  height is 5\' 6"  (1.676 m) and weight is 173 lb 14.4 oz (78.881 kg). Her temperature is 97.8 F (36.6 C). Her blood pressure is 147/71 and her pulse is 102. Her oxygen saturation is 100%. . No palpable supraclavicular or axillary adenopathy. The lungs are clear to auscultation. The heart has a regular rhythm and rate. The abdomen is soft and nontender with normal bowel sounds. A pelvic exam is deferred until simulation today. The patient did undergo an examination on August 29 by Dr. Chestine SporeSharol Given.  Lab Findings: Lab Results  Component Value Date   WBC 4.2 09/06/2013   HGB 9.2* 09/06/2013   HCT 27.5* 09/06/2013   MCV 95.2 09/06/2013   PLT 289 09/06/2013      Radiographic Findings: Dg Abd 1 View  09/03/2013   CLINICAL DATA:  Diarrhea, fecal incontinence  EXAM: ABDOMEN - 1 VIEW  COMPARISON:  None  FINDINGS: Scattered stool throughout colon, mildly prominent in rectum.  Nonobstructive bowel gas pattern.  No bowel dilatation or bowel wall thickening.  Bones demineralized.  No definite urinary tract calcification.  IMPRESSION:  Increased stool in rectum.   Electronically Signed   By: Ulyses Southward M.D.   On: 09/03/2013 08:42    Impression:  Stage IA papillary serous carcinoma of the endometrium with lymphovascular space invasion.   Plan:  The patient will be tentatively scheduled for her simulation and planning as well as her first high-dose-rate treatment the second week in November. This break should allow the patient time to recuperate from her side effects with chemotherapy as above. Patient will receive 5 intracavitary treatments as part of her overall  management.  _____________________________________  -----------------------------------  Billie Lade, PhD, MD

## 2013-09-12 NOTE — Progress Notes (Signed)
Please see the Nurse Progress Note in the MD Initial Consult Encounter for this patient. 

## 2013-09-13 ENCOUNTER — Telehealth: Payer: Self-pay | Admitting: *Deleted

## 2013-09-13 NOTE — Telephone Encounter (Signed)
Called patient's daughter Marin Roberts to inform of her mom's HDR Case, lvm for a return call

## 2013-09-17 ENCOUNTER — Other Ambulatory Visit: Payer: Self-pay | Admitting: Internal Medicine

## 2013-09-17 ENCOUNTER — Ambulatory Visit (HOSPITAL_BASED_OUTPATIENT_CLINIC_OR_DEPARTMENT_OTHER): Payer: Medicare Other | Admitting: Oncology

## 2013-09-17 ENCOUNTER — Telehealth: Payer: Self-pay | Admitting: *Deleted

## 2013-09-17 ENCOUNTER — Encounter: Payer: Self-pay | Admitting: Oncology

## 2013-09-17 ENCOUNTER — Ambulatory Visit: Payer: Self-pay

## 2013-09-17 ENCOUNTER — Encounter: Payer: Self-pay | Admitting: Internal Medicine

## 2013-09-17 ENCOUNTER — Other Ambulatory Visit (HOSPITAL_BASED_OUTPATIENT_CLINIC_OR_DEPARTMENT_OTHER): Payer: Medicare Other

## 2013-09-17 VITALS — BP 104/65 | HR 99 | Temp 97.8°F | Resp 18 | Ht 66.0 in | Wt 167.7 lb

## 2013-09-17 DIAGNOSIS — C541 Malignant neoplasm of endometrium: Secondary | ICD-10-CM

## 2013-09-17 DIAGNOSIS — C549 Malignant neoplasm of corpus uteri, unspecified: Secondary | ICD-10-CM

## 2013-09-17 DIAGNOSIS — F22 Delusional disorders: Secondary | ICD-10-CM

## 2013-09-17 DIAGNOSIS — R159 Full incontinence of feces: Secondary | ICD-10-CM

## 2013-09-17 DIAGNOSIS — D709 Neutropenia, unspecified: Secondary | ICD-10-CM

## 2013-09-17 LAB — CBC WITH DIFFERENTIAL/PLATELET
Basophils Absolute: 0 10*3/uL (ref 0.0–0.1)
Eosinophils Absolute: 0 10*3/uL (ref 0.0–0.5)
HCT: 24.3 % — ABNORMAL LOW (ref 34.8–46.6)
HGB: 8.2 g/dL — ABNORMAL LOW (ref 11.6–15.9)
LYMPH%: 39.2 % (ref 14.0–49.7)
MCH: 32.4 pg (ref 25.1–34.0)
MCV: 96.1 fL (ref 79.5–101.0)
MONO#: 0.3 10*3/uL (ref 0.1–0.9)
MONO%: 17.5 % — ABNORMAL HIGH (ref 0.0–14.0)
NEUT#: 0.8 10*3/uL — ABNORMAL LOW (ref 1.5–6.5)
NEUT%: 40.6 % (ref 38.4–76.8)
Platelets: 82 10*3/uL — ABNORMAL LOW (ref 145–400)
RDW: 15.7 % — ABNORMAL HIGH (ref 11.2–14.5)
WBC: 2 10*3/uL — ABNORMAL LOW (ref 3.9–10.3)

## 2013-09-17 MED ORDER — FILGRASTIM 300 MCG/0.5ML IJ SOLN
300.0000 ug | Freq: Once | INTRAMUSCULAR | Status: AC
  Administered 2013-09-17: 300 ug via SUBCUTANEOUS
  Filled 2013-09-17: qty 0.5

## 2013-09-17 MED ORDER — LIDOCAINE VISCOUS 2 % MT SOLN: OROMUCOSAL | Status: DC

## 2013-09-17 NOTE — Patient Instructions (Addendum)
Call for temperature >=100.5 while neutropenic. Nothing into rectum while neutropenic and be sure to do the sitz baths especially while white blood count is low. Sitz baths after every incontinent stool.  Use barrier cream (desitin fine, can mix with viscous lidocaine) externally to hemorrhoids. When she is no longer neutropenic would resume bid proctofoam.  When no longer neutropenic, would repeat the increased doses of MOM to try to get bowels to move well again  The least pain medicine she has to use, the better it will be for bowels.  Let us know if she seems symptomatic enough from the anemia to need blood transfusion. We will recheck CBC on Wed 10-8 particularly because of the low white count, and will see hemoglobin again then.   [phone]

## 2013-09-17 NOTE — Telephone Encounter (Signed)
appts made and printed. Pt is aware that tx will be added. i emailed MW to add the tx...td 

## 2013-09-17 NOTE — Telephone Encounter (Signed)
Per staff message and POF I have scheduled appts.  JMW  

## 2013-09-17 NOTE — Progress Notes (Signed)
OFFICE PROGRESS NOTE   09/17/2013   Physicians:D.ClarkePearson, J.Kinard, P. KWIATKOWSKI, Megan Morris , C.Gessner   INTERVAL HISTORY:  Patient is seen, together with daughter [name], in continuing attention to T1aN0 high grade serous endometrial carcinoma, having had cycle 5 taxol carboplatin chemotherapy on 09-06-13. She is neutropenic today despite neupogen x3 doses, has had no fever or obvious symptoms of infection.  She is uncomfortable from hemorrhoids despite proctofoam. She continues with slight fecal incontinence, which did improve for a few days after better bowel movement with 3 extra doses of MOM prior to 9-25 chemo. She is still not doing sitz baths regularly. She is using baby wipes and will begin barrier cream. She has long history of hemorrhoids, with surgery for this remotely. She is known to Dr Leone Payor but has not seen him for this problem. She has had increased neuropathy symptoms in feet bilaterally since last chemo, complaining of numbness involving feet up to ankles which is particularly bothersome at night. Family is massaging her feet regularly and she has not had more problems ambulating. She does not have PAC.  She saw Dr Roselind Messier in follow up of 09-12-13. He recommends vaginal cuff brachytherapy beginning in November, to allow some recovery from chemotherapy first.  ONCOLOGIC HISTORY Patient had been in usual good health until several weeks of vaginal spotting in early 2014, without other symptoms. She had endometrial biopsy 03-02-2012 [phone]) with grade 1 endometroid adenocarcinoma. She was referred to Dr Yolande Jolly, his exam not remarkable otherwise. She had colonoscopy by Dr Leone Payor on 04-23-13, with a diminutive adenoma and otherwise benign. She had CXR preoperatively 04-20-2013 with chronic bronchitic and interstitial changes and degenerative changes of spine, otherwise not remarkable; I do not believe that she had other imaging of abdomen/ pelvis. She was taken to  total robotic hysterectomy with BSO and bilateral pelvic node evaluation by Dr Duard Brady on 04-24-2013. Post operative course was uncomplicated and she was discharged home on POD 1. Pathology [id number]) found high grade papillary serous carcinoma tumor size 3.5 cm with myometrial invasion 0.5 cm where myometrium was 1.5 cm, with focal angiolymphatic invasion, 6 right pelvic and 1 left pelvic node negative, for T1aN0 staging. She saw Dr Yolande Jolly in follow up on 05-09-13, with recommendation for 6 cycles of adjuvant taxol/ carboplatin followed by vaginal vault brachytherapy. Cycle 1 taxol carbo was 05-30-13, complicated by apparent steroid psychosis, severe aching and neutropenia. Cycle 2 was carboplatin only on 06-20-13; she was hospitalized 7-11 and 06-23-13 with dehydration and decreased blood pressure. Cycle 3 taxol at 135 mg/m2 + carboplatin was given on 07-11-13, with hospitalization 8-1 thru 07-17-13 for new atrial fibrillation with RVR, begun on xarelto. Cycle 4 delayed x 1 week for counts, given 08-06-13 with taxol at 135 mg/m2 and carbo dose reduced due to labs.   Review of systems as above, also:PO intake is a bit better, including regular diet last 2 meals and attention to fluids. She continues to hold antihypertensive meds. No increased symptoms with present anemia. No overt bleeding.   Remainder of 10 point Review of Systems negative.  Objective:  Vital signs in last 24 hours:  BP 104/65  Pulse 99  Temp(Src) 97.8 F (36.6 C) (Oral)  Resp 18  Ht 5\' 6"  (1.676 m)  Wt 167 lb 11.2 oz (76.068 kg)  BMI 27.08 kg/m2 Weight is down ~ 6 lbs.  Alert, oriented and flat affect and slow responses but appropriate. Ambulatory with assistance.  Complete alopecia  HEENT:PERRL, sclerae not icteric. Oral mucosa moist  without lesions, posterior pharynx clear, mucous membranes somewhat pale. Neck supple. No JVD.  Lymphatics:no cervical,suraclavicular, axillary or inguinal adenopathy Resp: clear to  auscultation bilaterally and normal percussion bilaterally Cardio: regular rate and rhythm. No gallop. GI: soft, nontender, not distended, no mass or organomegaly. Some bowel sounds present. Surgical incision not remarkable. External hemorrhoids minimally firm but not obviously thrombosed, not red or hot. Tiny fecal material on pad, light brown. No bleeding. No perirectal tenderness otherwise. No internal rectal exam done with neutropenia. Musculoskeletal/ Extremities: without pitting edema, cords, tenderness Neuro: decreased sensation feet bilaterally, MS as above, no other focal deficits Skin without rash, ecchymosis, petechiae   Lab Results:  Results for orders placed in visit on 09/17/13  CBC WITH DIFFERENTIAL      Result Value Range   WBC 2.0 (*) 3.9 - 10.3 10e3/uL   NEUT# 0.8 (*) 1.5 - 6.5 10e3/uL   HGB 8.2 (*) 11.6 - 15.9 g/dL   HCT 16.1 (*) 09.6 - 04.5 %   Platelets 82 (*) 145 - 400 10e3/uL   MCV 96.1  79.5 - 101.0 fL   MCH 32.4  25.1 - 34.0 pg   MCHC 33.7  31.5 - 36.0 g/dL   RBC 4.09 (*) 8.11 - 9.14 10e6/uL   RDW 15.7 (*) 11.2 - 14.5 %   lymph# 0.8 (*) 0.9 - 3.3 10e3/uL   MONO# 0.3  0.1 - 0.9 10e3/uL   Eosinophils Absolute 0.0  0.0 - 0.5 10e3/uL   Basophils Absolute 0.0  0.0 - 0.1 10e3/uL   NEUT% 40.6  38.4 - 76.8 %   LYMPH% 39.2  14.0 - 49.7 %   MONO% 17.5 (*) 0.0 - 14.0 %   EOS% 1.4  0.0 - 7.0 %   BASO% 1.3  0.0 - 2.0 %     Studies/Results:  No results found.  Medications: I have reviewed the patient's current medications.She uses hydrocodone APAP 10-325 usually 2 tablets 2-3x daily, and ativan at hs; I have encouraged patient and daughter to decrease the pain medication as possible due to bowel issues. WIll begin desitin ointment mixed with topical lidocaine after sitz baths. She is given neupogen today and will have this also 09-18-13; we will recheck CBC on 10-8 and give neupogen also then if ANC <1.2.   DISCUSSION With progressive peripheral neuropathy in feet,  will not give taxol with cycle 6 chemotherapy; patient and daughter understand and agree.  Assessment/Plan: 1. 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 She will have additional neupogen beginning today, repeat CBC on 10-8. I will see her ~ 10-15 and she will have cycle 6 with carboplatin only on 10-16 if counts allow. Vaginal brachytherapy by Dr Roselind Messier planned to begin Nov. She is to see Dr Yolande Jolly again in Nov.  2.extreme agitation and paranoia after cycle 1 chemo, likely steroid exacerbation of previous tendencies. Bid haldol has been helpful.  3.Fecal incontinence which seems related to significant constipation and hemorrhoids. She is instructed to increase MOM, decrease prn pain meds, do sitz baths, hold proctofoam until not neutropenic, begin barrier cream as above. 4.HTN x years. antihypertensives held for low BP, symptoms improved and BP adequate off meds today. 5.degenerative arthritis knees  6.allergic rhinitis not bothersome now 7.unremarkable colonoscopy and mammograms around time of cancer diagnosis  8. No flu shot yet, 9.paroxysmal a fib with  RVR: spontaneously converted during hospitalization 8-1 thru 07-17-13,xarelto not continued after DC from hospital peer history from daughter now. She does not seem to be in a fib by exam now.    Patient seems comfortable with discussion, daughter is very supportive and has had questions answered to her satisfaction. They know to call if temp >=100.5 or symptoms of infection while neutropenic    Kai Railsback P, MD   09/17/2013, 11:39 AM

## 2013-09-18 ENCOUNTER — Ambulatory Visit (HOSPITAL_BASED_OUTPATIENT_CLINIC_OR_DEPARTMENT_OTHER): Payer: Medicare Other

## 2013-09-18 VITALS — BP 136/64 | HR 104 | Temp 98.1°F

## 2013-09-18 DIAGNOSIS — C541 Malignant neoplasm of endometrium: Secondary | ICD-10-CM

## 2013-09-18 DIAGNOSIS — C549 Malignant neoplasm of corpus uteri, unspecified: Secondary | ICD-10-CM

## 2013-09-18 DIAGNOSIS — Z5189 Encounter for other specified aftercare: Secondary | ICD-10-CM

## 2013-09-18 MED ORDER — HALOPERIDOL 1 MG PO TABS: 1.0000 mg | ORAL_TABLET | Freq: Two times a day (BID) | ORAL | Status: DC

## 2013-09-18 MED ORDER — FILGRASTIM 300 MCG/0.5ML IJ SOLN
300.0000 ug | Freq: Once | INTRAMUSCULAR | Status: AC
  Administered 2013-09-18: 300 ug via SUBCUTANEOUS
  Filled 2013-09-18: qty 0.5

## 2013-09-18 NOTE — Telephone Encounter (Signed)
Left message Rx was called into pharmacy as requested. 

## 2013-09-19 ENCOUNTER — Ambulatory Visit: Payer: Medicare Other

## 2013-09-19 ENCOUNTER — Other Ambulatory Visit (HOSPITAL_BASED_OUTPATIENT_CLINIC_OR_DEPARTMENT_OTHER): Payer: Medicare Other | Admitting: Lab

## 2013-09-19 ENCOUNTER — Telehealth: Payer: Self-pay | Admitting: *Deleted

## 2013-09-19 ENCOUNTER — Telehealth: Payer: Self-pay

## 2013-09-19 DIAGNOSIS — C541 Malignant neoplasm of endometrium: Secondary | ICD-10-CM

## 2013-09-19 DIAGNOSIS — C549 Malignant neoplasm of corpus uteri, unspecified: Secondary | ICD-10-CM

## 2013-09-19 LAB — CBC WITH DIFFERENTIAL/PLATELET
Basophils Absolute: 0 10*3/uL (ref 0.0–0.1)
Eosinophils Absolute: 0 10*3/uL (ref 0.0–0.5)
HCT: 24.3 % — ABNORMAL LOW (ref 34.8–46.6)
HGB: 8.2 g/dL — ABNORMAL LOW (ref 11.6–15.9)
MCV: 96 fL (ref 79.5–101.0)
MONO#: 0.4 10*3/uL (ref 0.1–0.9)
MONO%: 3.5 % (ref 0.0–14.0)
NEUT#: 11.2 10*3/uL — ABNORMAL HIGH (ref 1.5–6.5)
NEUT%: 85.7 % — ABNORMAL HIGH (ref 38.4–76.8)
RBC: 2.53 10*6/uL — ABNORMAL LOW (ref 3.70–5.45)
RDW: 15.9 % — ABNORMAL HIGH (ref 11.2–14.5)

## 2013-09-19 MED ORDER — FILGRASTIM 300 MCG/0.5ML IJ SOLN
300.0000 ug | Freq: Once | INTRAMUSCULAR | Status: DC
  Filled 2013-09-19: qty 0.5

## 2013-09-19 NOTE — Progress Notes (Signed)
Patient  Had CBC drawn today.  ANC 11.2.  Will hold Neupogen as per parameters in office note 09/18/13.

## 2013-09-19 NOTE — Telephone Encounter (Signed)
Walk- in form received for this patient.  "Lost balance and hit her head."  Patient in lobby with granddaughter.  Reports she had just awakened.  Got up to dress while granddaughter went to prepare her breakfast.  Larey Seat hitting head on a space heater.  Patient's left side of head above ear has a 2" and a 1" line resembling lines on the space heater that are red and slightly raised.  Deny any bleeding from these areas.  Instructed to try neosporin to these areas and DO NOT LEAVE HER UNATTENDED.  Collaborative nurse has received call from patient's daughter and took over this encounter.

## 2013-09-19 NOTE — Telephone Encounter (Signed)
Misty Alvarado here for lab and possible neupogen injection.  Her ANC is 11.2 and no injection needed per Dr. Precious Reel parameters of neupogen given if ANC <1.2 today noted in office visit A/P 09-17-13. Misty Alvarado lost her balance this am while dressing and hit her head on a space heater.  She has two 1 1/2 inch abrasions on the left,posterior,lateral portion of her head.  No bruising or pain to touch,  Slight swelling under anterior abrasion.  Patient a&O to person, place, and time. Discussed with Dr. Darrold Span.  Misty Alvarado is to go to the ED if there is any change noted in her mentation to be evaluated.  No x-ray needed today.  Reviewed this with Misty Alvarado and her grand-daughter.  They verbalized understanding.

## 2013-09-25 ENCOUNTER — Telehealth: Payer: Self-pay | Admitting: *Deleted

## 2013-09-25 NOTE — Telephone Encounter (Signed)
PT. TOLD HER DAUGHTER THAT SHE IS HAVING COLD SWEATS, PAIN IN RECTAL AREA, AND PAIN IN FEET AND LEGS. SPOKE WITH PT. THE PAIN IN HER FEET AND LEGS IS AT A SCALE OF EIGHT. THE HYDROCODONE/ACETAMINOPHEN 10/325 IS NOT HELPING MUCH. PT.'S GRANDDAUGHTER, ERIN, TOOK PT.'S TEMPERATURE. IT IS 97.6. PT. HAS USE THE PROCTOFOAM CREAM WHICH HELPED A LITTLE WITH THE RECTAL PAIN. VERBAL ORDER AND READ BACK TO DR.LIVESAY- PT. MAY TAKE TWO HYDROCODONE/ACETAMINOPHEN 10/325 EVERY FOUR TO SIX HOURS A NEEDED FOR PAIN UNTIL PT. IS SEEN TOMORROW. ENCOURAGE PT. TO USE SITZ BATH, HEATING PAD, OR TUB SOAK WITH THE PROCTOFOAM CREAM FOR RECTAL PAIN. NOTIFIED PT.'S DAUGHTER AND GRANDDAUGHTER OF THE ABOVE INSTRUCTIONS. THEY VOICE UNDERSTANDING.

## 2013-09-26 ENCOUNTER — Telehealth: Payer: Self-pay | Admitting: Oncology

## 2013-09-26 ENCOUNTER — Encounter: Payer: Self-pay | Admitting: Oncology

## 2013-09-26 ENCOUNTER — Other Ambulatory Visit (HOSPITAL_BASED_OUTPATIENT_CLINIC_OR_DEPARTMENT_OTHER): Payer: Medicare Other | Admitting: Lab

## 2013-09-26 ENCOUNTER — Ambulatory Visit (INDEPENDENT_AMBULATORY_CARE_PROVIDER_SITE_OTHER): Payer: Medicare Other | Admitting: Gastroenterology

## 2013-09-26 ENCOUNTER — Encounter: Payer: Self-pay | Admitting: Gastroenterology

## 2013-09-26 ENCOUNTER — Ambulatory Visit (HOSPITAL_BASED_OUTPATIENT_CLINIC_OR_DEPARTMENT_OTHER): Payer: Medicare Other | Admitting: Oncology

## 2013-09-26 VITALS — BP 143/75 | HR 90 | Temp 98.7°F | Resp 18 | Ht 66.0 in | Wt 168.4 lb

## 2013-09-26 VITALS — BP 138/70 | HR 78 | Ht 66.0 in | Wt 166.4 lb

## 2013-09-26 DIAGNOSIS — C549 Malignant neoplasm of corpus uteri, unspecified: Secondary | ICD-10-CM

## 2013-09-26 DIAGNOSIS — R151 Fecal smearing: Secondary | ICD-10-CM

## 2013-09-26 DIAGNOSIS — N39490 Overflow incontinence: Secondary | ICD-10-CM

## 2013-09-26 DIAGNOSIS — C541 Malignant neoplasm of endometrium: Secondary | ICD-10-CM

## 2013-09-26 DIAGNOSIS — K5900 Constipation, unspecified: Secondary | ICD-10-CM | POA: Insufficient documentation

## 2013-09-26 DIAGNOSIS — K6289 Other specified diseases of anus and rectum: Secondary | ICD-10-CM

## 2013-09-26 LAB — CBC WITH DIFFERENTIAL/PLATELET
Basophils Absolute: 0 10*3/uL (ref 0.0–0.1)
Eosinophils Absolute: 0 10*3/uL (ref 0.0–0.5)
HCT: 24.2 % — ABNORMAL LOW (ref 34.8–46.6)
HGB: 8.3 g/dL — ABNORMAL LOW (ref 11.6–15.9)
LYMPH%: 29.3 % (ref 14.0–49.7)
MCV: 97.1 fL (ref 79.5–101.0)
MONO%: 9.9 % (ref 0.0–14.0)
NEUT#: 1.6 10*3/uL (ref 1.5–6.5)
Platelets: 213 10*3/uL (ref 145–400)
RBC: 2.49 10*6/uL — ABNORMAL LOW (ref 3.70–5.45)
WBC: 2.6 10*3/uL — ABNORMAL LOW (ref 3.9–10.3)

## 2013-09-26 LAB — COMPREHENSIVE METABOLIC PANEL (CC13)
ALT: 7 U/L (ref 0–55)
Alkaline Phosphatase: 75 U/L (ref 40–150)
BUN: 10.2 mg/dL (ref 7.0–26.0)
CO2: 24 mEq/L (ref 22–29)
Calcium: 9.1 mg/dL (ref 8.4–10.4)
Chloride: 104 mEq/L (ref 98–109)
Glucose: 112 mg/dl (ref 70–140)
Total Bilirubin: 0.46 mg/dL (ref 0.20–1.20)

## 2013-09-26 MED ORDER — LUBIPROSTONE 8 MCG PO CAPS: 8.0000 ug | ORAL_CAPSULE | Freq: Two times a day (BID) | ORAL | Status: DC

## 2013-09-26 MED ORDER — HYDROCORTISONE ACE-PRAMOXINE 1-1 % RE FOAM: 1.0000 | Freq: Three times a day (TID) | RECTAL | Status: DC

## 2013-09-26 MED ORDER — HYOSCYAMINE SULFATE ER 0.375 MG PO TB12: 0.3750 mg | ORAL_TABLET | Freq: Two times a day (BID) | ORAL | Status: DC | PRN

## 2013-09-26 NOTE — Progress Notes (Signed)
09/26/2013 Misty Alvarado [id number] 03-Apr-1933   History of Present Illness:  The patient is a pleasant 79 year old female who presents to our office today with her daughter. She is known to Dr. Leone Payor for a recent colonoscopy in May of this year, which showed 2 diminutive sessile polyps, which were tubular adenomas. The colon was otherwise normal. The patient is being treated for endometrial cancer by Dr. Darrold Span.  She was supposed to receive a dose of chemotherapy tomorrow, however, they have planned to hold her chemotherapy for a while.  Her daughter reports that her mother's had a lot of problems with her bowels recently. She says that the chemotherapy seems to give her diarrhea, which does not subside until just a few days prior to her next chemotherapy treatment. She is having incontinence as well. She also complains of rectal pain that occurs frequently throughout the day and lasts several seconds before subsiding. The patient describes it as a sharp pain. She has been using ProctoFoam, but has been only applying it externally because of her neutropenia. Her daughter states that the patient received a Neupogen injection so her counts are better now, and should continue to get better since she will not be receiving any chemotherapy in the near future.  Abdominal x-ray in September showed scattered stool throughout the colon with increased stool in the rectum.   Current Medications, Allergies, Past Medical History, Past Surgical History, Family History and Social History were reviewed in [employer] record.   Physical Exam: BP 138/70  Pulse 78  Ht 5\' 6"  (1.676 m)  Wt 166 lb 6 oz (75.467 kg)  BMI 26.87 kg/m2  SpO2 96% General: Frail elderly white female in no acute distress Head: Normocephalic and atraumatic; she has full head alopecia Eyes:  sclerae anicteric, conjunctiva pink  Ears: Normal auditory acuity Lungs: Clear throughout to auscultation Heart:  Regular rate and rhythm Abdomen: Soft, non-tender and non-distended. No masses, no hepatomegaly. Normal bowel sounds. Rectal:  External hemorrhoids noted.  Light brown stool seen leaking out of the rectum.  DRE revealed a large amount of soft stool in the rectal vault. Musculoskeletal: Symmetrical with no gross deformities  Extremities: No edema  Neurological: Alert oriented x 4, grossly nonfocal Psychological:  Alert and cooperative. Normal mood and affect. Slow mentation.  Assessment and Recommendations: -Rectal pain:  ? Rectal spasm.  Will try levbid 0.375 mg TID as well as proctofoam TID (to be applied internally with applicator). -Constipation with possible overflow incontinence:  She did have a lot of soft stool in her rectum. I have instructed her daughter to give the patient one or 2 fleets enemas until stool seems to be cleared out. Then she is to begin taking amitiza 8 mcg twice a day. Her daughter will call next week with an update on her mother's status. We may need to increase the amitiza 24 mcg instead, if her current dose is not successful.

## 2013-09-26 NOTE — Progress Notes (Signed)
OFFICE PROGRESS NOTE   09/26/2013   Physicians:  INTERVAL HISTORY:  Patient is seen, together with daughter [name], in continuing attention to adjuvant chemotherapy for T1aNo high grade serous endometrial cancer. She has had five cycles of taxol and carboplatin from 05-30-13 thru 09-06-13. She has had a difficult time tolerating chemotherapy and is in agreement with recommendation now that we not try to give cycle 6. This week she has had a particularly difficult time with spasms of pain in rectum, still oozing stool incontinently but no real bowel movement in probably a couple of weeks. She describes rectal discomfort as burning. She has used sitz bath only once, that yesterday after family again spoke to this office. She has used lidocaine gel for hemorrhoids, previously tried proctofoam, these minimally helpful. She has not wanted to use laxative other than MOM, has not tried enemas or other suppositories. She continues to use hydrocodone including for the rectal discomfort.   ONCOLOGIC HISTORY Patient had been in usual good health until several weeks of vaginal spotting in early 2014, without other symptoms. She had endometrial biopsy 03-02-2012 [phone]) with grade 1 endometroid adenocarcinoma. She was referred to Dr Yolande Jolly, his exam not remarkable otherwise. She had colonoscopy by Dr Leone Payor on 04-23-13, with a diminutive adenoma and otherwise benign. She had CXR preoperatively 04-20-2013 with chronic bronchitic and interstitial changes and degenerative changes of spine, otherwise not remarkable; I do not believe that she had other imaging of abdomen/ pelvis. She was taken to total robotic hysterectomy with BSO and bilateral pelvic node evaluation by Dr Duard Brady on 04-24-2013. Post operative course was uncomplicated and she was discharged home on POD 1. Pathology [phone]) found high grade papillary serous carcinoma tumor size 3.5 cm with myometrial invasion 0.5 cm where myometrium was 1.5 cm,  with focal angiolymphatic invasion, 6 right pelvic and 1 left pelvic node negative, for T1aN0 staging.  Recommendation was for 6 cycles of adjuvant taxol/ carboplatin followed by vaginal vault brachytherapy. Cycle 1 taxol carbo was 05-30-13, complicated by apparent steroid psychosis, severe aching and neutropenia. Cycle 2 was carboplatin only on 06-20-13; she was hospitalized 7-11 and 06-23-13 with dehydration and decreased blood pressure. Cycle 3 taxol at 135 mg/m2 + carboplatin was given on 07-11-13, with hospitalization 8-1 thru 07-17-13 for new atrial fibrillation with RVR. Cycle 4 delayed x 1 week for counts, given 08-06-13 with taxol at 135 mg/m2 and carbo dose reduced due to labs. Cycle 5 was given 09-06-13 after further delay for counts, neutropenic by 09-17-13 despite initial neupogen, and bothersome peripheral neuropathy in feet and legs since that treatment (taxol at 135 mg/m2).   Review of systems as above, also: Has not fallen. No bleeding. No vomiting. No cough or chest pain. No swelling LE. No neuropathy in hands. Daughter has to change the pads being used for fecal incontinence. She walked a little in house yesterday. Remainder of 10 point Review of Systems negative.  Reluctant to take laxatives. Refused sitz baths until done x1 yesterday.  Objective:  Vital signs in last 24 hours:  BP 143/75  Pulse 90  Temp(Src) 98.7 F (37.1 C) (Oral)  Resp 18  Ht 5\' 6"  (1.676 m)  Wt 168 lb 6.4 oz (76.386 kg)  BMI 27.19 kg/m2  SpO2 100%  Alert, oriented, flat affect and slow to respond as she has been, responses  Appropriate and cooperative with exam.  In WC, able to stand with assistance for rectal exam  Complete alopecia  HEENT:PERRL, sclerae not icteric. Oral mucosa  moist without lesions, posterior pharynx clear.  Neck supple. No JVD.  Lymphatics:no cervical,suraclavicular adenopathy Resp: clear to auscultation bilaterally and normal percussion bilaterally Cardio: regular rate and rhythm. No  gallop. GI: soft, nontender, not distended, quiet. Perirectal area not remarkable. Oozing small yellowish stool. External hemorrhoids not red or thrombosed. Cannot feel impacted stool in lower rectum.  Musculoskeletal/ Extremities: without pitting edema, cords, tenderness Neuro/psych: as above, also decreased sensation feet bilaterally. Strength symmetrical.  Skin without rash, ecchymosis, petechiae    Lab Results:  Results for orders placed in visit on 09/26/13  CBC WITH DIFFERENTIAL      Result Value Range   WBC 2.6 (*) 3.9 - 10.3 10e3/uL   NEUT# 1.6  1.5 - 6.5 10e3/uL   HGB 8.3 (*) 11.6 - 15.9 g/dL   HCT 40.9 (*) 81.1 - 91.4 %   Platelets 213  145 - 400 10e3/uL   MCV 97.1  79.5 - 101.0 fL   MCH 33.3  25.1 - 34.0 pg   MCHC 34.3  31.5 - 36.0 g/dL   RBC 7.82 (*) 9.56 - 2.13 10e6/uL   RDW 16.5 (*) 11.2 - 14.5 %   lymph# 0.8 (*) 0.9 - 3.3 10e3/uL   MONO# 0.3  0.1 - 0.9 10e3/uL   Eosinophils Absolute 0.0  0.0 - 0.5 10e3/uL   Basophils Absolute 0.0  0.0 - 0.1 10e3/uL   NEUT% 59.5  38.4 - 76.8 %   LYMPH% 29.3  14.0 - 49.7 %   MONO% 9.9  0.0 - 14.0 %   EOS% 0.6  0.0 - 7.0 %   BASO% 0.7  0.0 - 2.0 %  COMPREHENSIVE METABOLIC PANEL (CC13)      Result Value Range   Sodium 138  136 - 145 mEq/L   Potassium 3.7  3.5 - 5.1 mEq/L   Chloride 104  98 - 109 mEq/L   CO2 24  22 - 29 mEq/L   Glucose 112  70 - 140 mg/dl   BUN 08.6  7.0 - 57.8 mg/dL   Creatinine 0.9  0.6 - 1.1 mg/dL   Total Bilirubin 4.69  0.20 - 1.20 mg/dL   Alkaline Phosphatase 75  40 - 150 U/L   AST 20  5 - 34 U/L   ALT 7  0 - 55 U/L   Total Protein 6.6  6.4 - 8.3 g/dL   Albumin 3.1 (*) 3.5 - 5.0 g/dL   Calcium 9.1  8.4 - 62.9 mg/dL   Anion Gap 10  3 - 11 mEq/L     Studies/Results:  No results found.  Medications: I have reviewed the patient's current medications.  DISCUSSION:I do not believe PS is adequate at this point to give cycle 6 chemo even with just single agent carboplatin; daughter is in full agreement  with stopping adjuvant chemotherapy now, and patient concurs. Primary discomfort now is with probable stool impaction with oozing liquid stool around this, and irritated hemorrhoids. Situation has gotten progressively worse as patient has refused to do interventions as recommended. We have discussed sitz baths, increasing laxatives and holding hydrocodone again now. I have spoken with [facility], that office kindly working patient in later this afternoon.  Assessment/Plan:  1. 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 Still anemic and will let us know if more symptomatic  prior to next visit. Vaginal brachytherapy by Dr Roselind Messier planned to begin Nov. She will see me in 2-3 weeks with recheck labs and is to see Dr Yolande Jolly in Nov. 2.extreme agitation and paranoia after cycle 1 chemo, likely steroid exacerbation of previous tendencies. Bid haldol helpful.  3.Fecal incontinence which seems related to significant constipation and hemorrhoids. Appreciate assistance from GI today. 4.HTN x years. antihypertensives held for low BP, symptoms improved and BP adequate off meds today.  5.degenerative arthritis knees  6.allergic rhinitis not bothersome now  7.unremarkable colonoscopy and mammograms around time of cancer diagnosis  8. No flu shot yet, but will give this at my next visit if possible. 9.paroxysmal a fib with RVR: spontaneously converted during hospitalization 8-1 thru 07-17-13,xarelto not continued after DC from hospital    Daughter understood all of instructions and discussion and she/patient are in agreement with plan.  Loyd Salvador P, MD   09/26/2013, 11:15 AM

## 2013-09-26 NOTE — Patient Instructions (Addendum)
Use the Fleets Enema's first. We sent prescriptions to [facility] for: We phoned in the prescriptions.  Amitiza 8 mcg twice daily with food.  Levbid 0.375 mg, Take 1 tab 3 times daily. Proctofoam suppositories with applicator internally in the rectum.

## 2013-09-27 ENCOUNTER — Encounter: Payer: Self-pay | Admitting: Oncology

## 2013-09-27 ENCOUNTER — Ambulatory Visit: Payer: Self-pay

## 2013-09-27 NOTE — Progress Notes (Signed)
[facility] END OF TREATMENT   Name: Misty Alvarado Date: 09/27/2013 MRN: [id number] DOB: 1933-07-16   TREATMENT DATES: 05-30-2013 - 09-06-2013   REFERRING PHYSICIAN: D.ClarkePearson  DIAGNOSIS: endometrial carcinoma  STAGE AT START OF TREATMENT: T1aN0 high grade serous endometrial carcinoma   INTENT: curative   DRUGS OR REGIMENS GIVEN: taxol carboplatin   MAJOR TOXICITIES: acute psychotic reaction cycle 1, hospitalizations for dehydration, hypotension and atrial fibrillation, taxol aches, cytopenias, peripheral neuropathy   REASON TREATMENT STOPPED: decrease in performance status and poor tolerance   PERFORMANCE STATUS AT END: 3   ONGOING PROBLEMS: obstipation with fecal incontinence, anemia, peripheral neuropathy in feet   FOLLOW UP PLANS:  Medical oncology in 2-3 weeks Gyn oncology Nov 2014 Vaginal brachytherapy to begin ~ Nov 2014

## 2013-09-28 ENCOUNTER — Ambulatory Visit: Payer: Self-pay

## 2013-09-28 NOTE — Progress Notes (Signed)
Sounds like she may have a soft impaction leading to overflow problems. Appropriate recommendations made by Ms. Cristi Loron.  Iva Boop, MD, [facility]

## 2013-09-29 ENCOUNTER — Ambulatory Visit: Payer: Self-pay

## 2013-10-01 ENCOUNTER — Ambulatory Visit: Payer: Self-pay

## 2013-10-01 ENCOUNTER — Telehealth: Payer: Self-pay

## 2013-10-01 NOTE — Telephone Encounter (Signed)
Misty Alvarado called to let Dr. Melissa Noon know that Dr. Marvell Fuller office at visit 09-26-13 directed  Misty Alvarado to take 2 fleet enemas, Use Levdid 0.375 mg tid. Prescribed Amitiza 8 mcg bid with food.  Patient was not to start this med until her bowels were cleared out. Proctfofoam suppositories with applicator internally in the rectum. Misty Alvarado gave her mother 1 fleet enema which produced soft stool-a mess.  She did not give second enema.  She gave her mother 1 bottle of Mag-citrate on Thursday 10-16 and another bottle on Friday 09-28-13.   Misty Alvarado continues to be incontinent of soft stool.  She refuses sitz bath for rectal pain Misty Alvarado to give this update to Dr. Marvell Fuller office tomorrow. Information given to Dr. Darrold Span to review.

## 2013-10-07 ENCOUNTER — Other Ambulatory Visit: Payer: Self-pay | Admitting: Oncology

## 2013-10-10 ENCOUNTER — Ambulatory Visit (HOSPITAL_BASED_OUTPATIENT_CLINIC_OR_DEPARTMENT_OTHER): Payer: Medicare Other | Admitting: Oncology

## 2013-10-10 ENCOUNTER — Other Ambulatory Visit (HOSPITAL_BASED_OUTPATIENT_CLINIC_OR_DEPARTMENT_OTHER): Payer: Medicare Other | Admitting: Lab

## 2013-10-10 ENCOUNTER — Other Ambulatory Visit: Payer: Self-pay

## 2013-10-10 ENCOUNTER — Encounter: Payer: Self-pay | Admitting: Oncology

## 2013-10-10 ENCOUNTER — Ambulatory Visit: Payer: Self-pay

## 2013-10-10 VITALS — BP 150/86 | HR 86 | Temp 97.8°F | Resp 18 | Ht 66.0 in | Wt 156.1 lb

## 2013-10-10 DIAGNOSIS — I10 Essential (primary) hypertension: Secondary | ICD-10-CM

## 2013-10-10 DIAGNOSIS — C541 Malignant neoplasm of endometrium: Secondary | ICD-10-CM

## 2013-10-10 DIAGNOSIS — C549 Malignant neoplasm of corpus uteri, unspecified: Secondary | ICD-10-CM

## 2013-10-10 DIAGNOSIS — Z23 Encounter for immunization: Secondary | ICD-10-CM

## 2013-10-10 LAB — CBC WITH DIFFERENTIAL/PLATELET
BASO%: 0.1 % (ref 0.0–2.0)
Eosinophils Absolute: 0.1 10*3/uL (ref 0.0–0.5)
MCHC: 34 g/dL (ref 31.5–36.0)
MONO#: 0.5 10*3/uL (ref 0.1–0.9)
NEUT#: 1.3 10*3/uL — ABNORMAL LOW (ref 1.5–6.5)
NEUT%: 36.5 % — ABNORMAL LOW (ref 38.4–76.8)
RBC: 3.18 10*6/uL — ABNORMAL LOW (ref 3.70–5.45)
RDW: 15.8 % — ABNORMAL HIGH (ref 11.2–14.5)
WBC: 3.5 10*3/uL — ABNORMAL LOW (ref 3.9–10.3)
lymph#: 1.6 10*3/uL (ref 0.9–3.3)

## 2013-10-10 MED ORDER — GABAPENTIN 100 MG PO CAPS: 100.0000 mg | ORAL_CAPSULE | Freq: Every day | ORAL | Status: DC

## 2013-10-10 MED ORDER — INFLUENZA VAC SPLIT QUAD 0.5 ML IM SUSP
0.5000 mL | Freq: Once | INTRAMUSCULAR | Status: AC
  Administered 2013-10-10: 0.5 mL via INTRAMUSCULAR
  Filled 2013-10-10: qty 0.5

## 2013-10-10 NOTE — Progress Notes (Signed)
OFFICE PROGRESS NOTE   10/10/2013   Physicians:D.ClarkePearson, J.Kinard, P. KWIATKOWSKI, Megan Morris , C.Gessner   INTERVAL HISTORY:  Patient is seen, together with daughter [name], in continuing attention to recent adjuvant chemotherapy for early stage, high risk endometrial cancer. She had a difficult time thru treatment, but was able to receive 5 of the planned 6 cycles of chemotherapy, given from 05-30-13 thru 09-06-13.  HDR is to begin in November.Since she was last at here, [facility] addressed the problem of bowel impaction with fecal incontinence, which resolved entirely after eventually using magnesium citrate x2 (enemas not successful). Bowels have not moved now in 4 days despite some laxatives, and daughter plans to repeat mag citrate tonight. 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 Appetite is improving, able to eat chicken, peas, potatoes last pm. She is walking more on single level at daughter's home.  She does not have PAC.  ONCOLOGIC HISTORY Patient had several weeks of vaginal spotting in early 2014, without other symptoms. She had endometrial biopsy 03-02-2012 [phone]) with grade 1 endometroid adenocarcinoma. She was referred to Dr Yolande Jolly, his exam not remarkable otherwise. She had colonoscopy by Dr Leone Payor on 04-23-13, with a diminutive adenoma and otherwise benign. CXR preoperatively 04-20-2013 with chronic bronchitic and interstitial changes and degenerative changes of spine, otherwise not remarkable. She was taken to total robotic hysterectomy with BSO and bilateral pelvic node evaluation by Dr Duard Brady on 04-24-2013. Post operative course was uncomplicated and she was discharged home on POD 1. Pathology [phone]) found high grade papillary serous carcinoma tumor size 3.5 cm with myometrial invasion 0.5 cm where myometrium was 1.5 cm, with focal angiolymphatic invasion, 6  right pelvic and 1 left pelvic node negative, for T1aN0 staging. Recommendation was for 6 cycles of adjuvant taxol/ carboplatin followed by vaginal vault brachytherapy. Cycle 1 taxol carbo was 05-30-13, complicated by apparent steroid psychosis, severe aching and neutropenia; she remained on haldol scheduled dosing for remainder of chemotherapy. Cycle 2 was carboplatin only on 06-20-13; she was hospitalized 7-11 and 06-23-13 with dehydration and decreased blood pressure. Cycle 3 taxol at 135 mg/m2 + carboplatin was given on 07-11-13, with hospitalization 8-1 thru 07-17-13 for new atrial fibrillation with RVR. Cycle 4 delayed x 1 week for counts, given 08-06-13 with taxol at 135 mg/m2 and carbo dose reduced. Cycle 5 was given 09-06-13 after further delay for counts, neutropenic despite neupogen that cycle. Peripheral neuropathy was significantly worse after cycle 5, and overall performance status poor to the point that cycle 6 was cancelled. She had CT AP 07-14-13 during that hospitalization, without imaging evidence of malignancy.  Review of systems as above, also: No SOB with limited activity. Has not fallen. No fever, no symptoms of infection, no bleeding. Remainder of 10 point Review of Systems negative.  Objective:  Vital signs in last 24 hours:  BP 150/86  Pulse 86  Temp(Src) 97.8 F (36.6 C) (Oral)  Resp 18  Ht 5\' 6"  (1.676 m)  Wt 156 lb 1.6 oz (70.806 kg)  BMI 25.21 kg/m2  SpO2 100%  Alert, oriented and slowly conversant with flat affect, as she has had on the haldol. In wheelchair, respirations not labored RA.  Total alopecia  HEENT:PERRL, sclerae not icteric. Oral mucosa moist without lesions, posterior pharynx clear.  Neck supple. No JVD.  Lymphatics:no cervical,suraclavicular adenopathy Resp: clear to auscultation bilaterally and normal percussion bilaterally Cardio: regular rate and rhythm. No  gallop. GI: soft, nontender, not distended, no mass or organomegaly. Some bowel sounds.  Surgical incision not remarkable. Musculoskeletal/ Extremities: without pitting edema, cords, tenderness. Back not tender Neuro: decreased sensation symmetrically from feet to lower 1/3 legs bilaterally. None apparent in fingers/ hands. Moves all extremities in WC. Psych as above Skin without rash, ecchymosis, petechiae   Lab Results:  Results for orders placed in visit on 10/10/13  CBC WITH DIFFERENTIAL      Result Value Range   WBC 3.5 (*) 3.9 - 10.3 10e3/uL   NEUT# 1.3 (*) 1.5 - 6.5 10e3/uL   HGB 10.6 (*) 11.6 - 15.9 g/dL   HCT 16.1 (*) 09.6 - 04.5 %   Platelets 197  145 - 400 10e3/uL   MCV 98.6  79.5 - 101.0 fL   MCH 33.5  25.1 - 34.0 pg   MCHC 34.0  31.5 - 36.0 g/dL   RBC 4.09 (*) 8.11 - 9.14 10e6/uL   RDW 15.8 (*) 11.2 - 14.5 %   lymph# 1.6  0.9 - 3.3 10e3/uL   MONO# 0.5  0.1 - 0.9 10e3/uL   Eosinophils Absolute 0.1  0.0 - 0.5 10e3/uL   Basophils Absolute 0.0  0.0 - 0.1 10e3/uL   NEUT% 36.5 (*) 38.4 - 76.8 %   LYMPH% 45.9  14.0 - 49.7 %   MONO% 15.0 (*) 0.0 - 14.0 %   EOS% 2.5  0.0 - 7.0 %   BASO% 0.1  0.0 - 2.0 %     Studies/Results:  CT AP was 07-14-13  Medications: I have reviewed the patient's current medications. WIll try gabapentin 100 mg at hs for taxol neuropathy. Will use mag citrate today. She was given flu vaccine today.    Assessment/Plan:  1. 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 Counts and chemo related symptoms other than the peripheral neuropathy showing a little improvememt. WIll add gabapentin at hs for taxol neuropathy. Vaginal brachytherapy by Dr Roselind Messier planned to begin Nov. She is to see Dr Yolande Jolly in Nov, and I will see her with labs in ~ 2 months. 2.extreme agitation and paranoia after cycle 1 chemo, likely  steroid exacerbation of previous tendencies. Bid haldol by PCP Dr Frederica Kuster helpful.  3.Fecal incontinence which related to significant constipation and hemorrhoids. Resolved with improvement in constipation. 4.HTN x years. antihypertensives held for low BP, symptoms improved and BP adequate off meds today.  5.degenerative arthritis knees  6.allergic rhinitis not bothersome now  7.unremarkable colonoscopy and mammograms around time of cancer diagnosis  8. flu shot done today  9.paroxysmal a fib with RVR: spontaneously     Patient and daughter were in agreement with plan above   Jahden Schara P, MD   10/10/2013, 4:35 PM

## 2013-10-10 NOTE — Progress Notes (Signed)
[facility] Psychosocial Distress Screening Clinical Social Work  Clinical Social Work was referred by distress screening protocol.  The patient scored a 5 on the Psychosocial Distress Thermometer which indicates moderate distress. Clinical Social Worker Intern telephoned to assess for distress and other psychosocial needs. Patient did not answer phone.  CSWI left message indicating call was to check on how Patient was doing and for her to return call if there was any assistance needed.   Clinical Social Worker follow up needed: no  If yes, follow up plan:   Kalonji Zurawski S. [facility] Clinical Social Work Intern [employer] [phone]

## 2013-10-10 NOTE — Patient Instructions (Signed)
Call if needed before next scheduled appointment.  Fine to use the mag citrate again today

## 2013-10-10 NOTE — Progress Notes (Signed)
[facility] Clinical Social Work  Clinical Social Work was referred by distress screening for assessment of psychosocial needs.  Clinical Warden/ranger received return phone call from Patient's caregiver.  Caregiver stated that Patient was doing well although occasionally "forgetful".  Caregiver indicated that Patient and Caregiver have a good support system and that at this time no follow-up was needed. CSWI informed Caregiver to contact SW if anything changed.    Clinical Social Work interventions:  Travonte Byard S. [facility] Clinical Social Work Intern [employer] [facility] [phone]

## 2013-10-12 ENCOUNTER — Telehealth: Payer: Self-pay | Admitting: Oncology

## 2013-10-12 NOTE — Telephone Encounter (Signed)
sw daughter made aware of 12/31 appts shh

## 2013-10-19 ENCOUNTER — Telehealth: Payer: Self-pay | Admitting: *Deleted

## 2013-10-19 NOTE — Telephone Encounter (Signed)
CALLED PATIENT'S DAUGHTER [name] ATKINSON TO REMIND OF HDR CASE FOR 10-22-13- ARRIVAL TIME - 8:00 AM, LVM FOR A RETURN CALL

## 2013-10-19 NOTE — Progress Notes (Signed)
GYN Location of Tumor / Histology: endometrium   Patient presented in early 2014 with symptoms of: post menopausal bleeding for several weeks  Biopsies of endometrium (if applicable) revealed: endometroid adenocarcinoma   Surgical Path  Accession #: [id number]  Collected Date: 04/24/2013 Received Date: 05/13/2014INALDIAGNOSIS  Diagnosis  1. Uterus, ovaries and fallopian tubes, with cervix  - PAPILLARY SEROUS CARCINOMA, INVADING INTO UNDERLYING MYOMETRIUM WITH FOCAL  ANGIOLYMPHATIC PRESENT.  - CERVIX: SQUAMOUS MUCOSA AND ENDOCERVICAL MUCOSA, NO DYSPLASIA OR  MALIGNANCY.  - BILATERAL FALLOPIAN TUBES AND OVARIES: NO PATHOLOGIC ABNORMALITIES.  2. Lymph nodes, regional resection, right pelvic  - SIX LYMPH NODE, NEGATIVE FOR METASTATIC CARCINOMA (0/6).  3. Lymph nodes, regional resection, left pelvic  - ONE LYMPH NODE AND MATURE ADIPOSE TISSUE, NO EVIDENCE OF MALIGNANCY (0/1).   Past/Anticipated interventions by Gyn/Onc surgery, if any: 04/24/13 robotic hysterectomy, BSO, pelvic lymphadenectomy   Past/Anticipated interventions by medical oncology, if any: adjuvant chemotherapy- q3 week Carbo/Taxol, 1st chemo 05/30/13, had cycle 5 on 09/06/2013, she has once more cycle.   Weight changes, if any: lost 40 lbs since May, 2014.   Bowel/Bladder complaints, if any: diarrhea, fecal incontinence   Nausea/Vomiting, if any: none   Pain issues, if any: pain in rectum that comes and goes. She is rating it a 6/10 today. She is taking 4-5 Norco's per day and she says it does not help much.   SAFETY ISSUES:  Prior radiation? no  Pacemaker/ICD? no  Possible current pregnancy? na  Is the patient on methotrexate? No   Current Complaints / other details: Retired Location manager at [employer], widowed x 30 years, 3 children, 2 grandchildren, daughter is Charity fundraiser w/[employer]. Patient states she does not feel good today. She is very weak and tired.

## 2013-10-22 ENCOUNTER — Ambulatory Visit
Admission: RE | Admit: 2013-10-22 | Discharge: 2013-10-22 | Disposition: A | Discharge status: Home / Self Care | Payer: Medicare Other | Source: Ambulatory Visit | Attending: Radiation Oncology | Admitting: Radiation Oncology

## 2013-10-22 ENCOUNTER — Encounter: Payer: Self-pay | Admitting: Radiation Oncology

## 2013-10-22 VITALS — BP 145/89 | HR 92 | Temp 97.6°F | Resp 18 | Ht 66.0 in | Wt 158.2 lb

## 2013-10-22 DIAGNOSIS — C549 Malignant neoplasm of corpus uteri, unspecified: Secondary | ICD-10-CM | POA: Insufficient documentation

## 2013-10-22 DIAGNOSIS — Z510 Encounter for antineoplastic radiation therapy: Secondary | ICD-10-CM | POA: Insufficient documentation

## 2013-10-22 DIAGNOSIS — C541 Malignant neoplasm of endometrium: Secondary | ICD-10-CM

## 2013-10-22 NOTE — Progress Notes (Signed)
  Radiation Oncology         [phone] ________________________________  Name: Misty Alvarado MRN: [id number]  Date: 10/22/2013  DOB: 01/03/1933   Vaginal brachytherapy procedure note  CC: Rogelia Boga, MD  Reece Packer, MD  Diagnosis:   Stage T1a N0 high grade serous endometrial carcinoma with lymphovascular space invasion   Narrative:  The patient returns today for planning of her vaginal brachytherapy treatment.  The patient was taken to the nursing suite. She was placed in the dorsolithotomy position. A pelvic examination was performed. There are no mucosal lesions noted in the vaginal vault. The vaginal cuff is intact. On pelvic examination there are no pelvic masses appreciated. The patient had hard stool noted in the rectal vault. The patient proceeded to undergo fitting for her vaginal cylinder. Optimal diameter cylinder was a 3.0 cm diameter cylinder. The patient tolerated the procedure well.                         Simple treatment device note  The patient had construction of her custom vaginal cylinder. She will be treated with three 3.0 cm rings placed proximally within the vagina. More distal will be two 2.5 cm rings. This arrangement distended the vaginal vault without undue discomfort.    Physical Findings: The patient is in no acute distress. Patient is alert and oriented.  height is 5\' 6"  (1.676 m) and weight is 158 lb 3.2 oz (71.759 kg). Her oral temperature is 97.6 F (36.4 C). Her blood pressure is 145/89 and her pulse is 92. Her respiration is 18 and oxygen saturation is 100%. .     Impression: The patient tolerated the procedure well  Plan:  She will proceed to the CT simulation room for imaging purposes and planning.  _____________________________________  -----------------------------------  Billie Lade, PhD, MD

## 2013-10-22 NOTE — Progress Notes (Signed)
Assisted Dr. Roselind Messier with HDR fitting. Patient tolerated well. Cylinder 9,9,9,8,8. Patient escorted to simulation by Arlys John, RT.

## 2013-10-22 NOTE — Progress Notes (Signed)
  Radiation Oncology         [phone] ________________________________  Name: Misty Alvarado MRN: [id number]  Date: 10/22/2013  DOB: 1933-10-29  High-dose-rate brachytherapy procedure note   CC: Rogelia Boga, MD  Reece Packer, MD  Diagnosis:  Stage T1a N0 high grade serous endometrial carcinoma with lymphovascular space invasion  Narrative:  The patient was transferred to the high dose rate suite after radiation planning was complete. The patient was placed in the dorsal lithotomy position. The previously constructed vaginal cylinder was placed in the proximal vagina. This was attached to the CT MR stabilization plate to present slippage.  Verification simulation note  A fiducial marker was placed within the vaginal cylinder. Patient had an AP and lateral film obtained in the treatment position. This was compared to the patient's planning films earlier in the day documenting accurate position for treatment.                            High-dose-rate brachytherapy  treatment note  The remote afterloading unit was attached to the vaginal cylinder by catheter system. Patient then proceeded to undergo her first high-dose-rate treatment. She was prescribed a dose of 6 Gy to be delivered to the proximal vaginal mucosal surface. This was achieved with 1 channel using 9 dwell positions for a treatment length of 4 cm.  Total treatment time was 290.9 seconds. After completion of the patient's treatment a radiation survey was performed documenting return of the iridium source into the Nucletron safe.  Impression:  The patient tolerated her first high-dose-rate treatment well.  Plan:  Return in one week for her second high-dose-rate treatment.  ___________________________________ -----------------------------------  Billie Lade, PhD, MD

## 2013-10-22 NOTE — Progress Notes (Signed)
  Radiation Oncology         [phone] ________________________________  Name: Misty Alvarado MRN: [id number]  Date: 10/22/2013  DOB: Aug 06, 1933  SIMULATION AND TREATMENT PLANNING NOTE  Diagnosis: Stage T1a N0 high grade serous endometrial carcinoma with lymphovascular space invasion   NARRATIVE:  The patient was brought to the CT Simulation planning suite.  Identity was confirmed.  All relevant records and images related to the planned course of therapy were reviewed.  The patient freely provided informed written consent to proceed with treatment after reviewing the details related to the planned course of therapy. The consent form was witnessed and verified by the simulation staff.  Then, the patient was set-up in a stable reproducible  supine position for radiation therapy.  The previously constructed vaginal cylinder was placed in the vaginal vault. This was affixed to the CT/MR stabilization plate to prevent slippage. CT images were obtained.   Then the target and avoidance structures were contoured.  Treatment planning then occurred.  The radiation prescription was entered and confirmed.    PLAN:  The patient will receive 30 Gy in 5 fractions using iridium 192 as the high-dose-rate source. Prescription length will be 4 cm of the proximal vaginal vault. Prescription depth will be to the vaginal mucosal surface.  ________________________________  -----------------------------------  Billie Lade, PhD, MD

## 2013-10-22 NOTE — Progress Notes (Signed)
See progress note under physician encounter. 

## 2013-10-22 NOTE — Progress Notes (Signed)
Here today with daughter, Zella Ball that lives in [place]. Daughter reports her mother fell two days ago. Patient reports that she felt very dizzy just before falling. Daughter confirms the patient did not loose consciousness. Bruise noted left side top of head. Daughter reports her mother's fifth and final chemotherapy treatment took place the end of September. Reports diarrhea associated with chemotherapy just resolved. Reports joint aches and pains related to effects of arthritis.

## 2013-10-26 ENCOUNTER — Telehealth: Payer: Self-pay | Admitting: *Deleted

## 2013-10-26 NOTE — Telephone Encounter (Signed)
Called patient's daughter Marin Roberts) to remind of HDR Tx. For 10-29-13, spoke with patient's daughter and she is aware of this treatment.

## 2013-10-29 ENCOUNTER — Ambulatory Visit
Admission: RE | Admit: 2013-10-29 | Discharge: 2013-10-29 | Disposition: A | Discharge status: Home / Self Care | Payer: Medicare Other | Source: Ambulatory Visit | Attending: Radiation Oncology | Admitting: Radiation Oncology

## 2013-10-29 DIAGNOSIS — C541 Malignant neoplasm of endometrium: Secondary | ICD-10-CM

## 2013-10-29 NOTE — Progress Notes (Signed)
   Department of Radiation Oncology  Phone:  [phone] Fax:        [phone]  Vaginal brachytherapy procedure note   CC: Rogelia Boga, MD Reece Packer, MD   Diagnosis: Stage T1a N0 high grade serous endometrial carcinoma with lymphovascular space invasion   Vaginal brachytherapy procedure: The patient returns today for  Her second vaginal brachytherapy treatment. She was placed in the dorsolithotomy position. A pelvic examination was performed. There are no mucosal lesions noted in the vaginal vault. The vaginal cuff is intact. On pelvic examination there are no pelvic masses appreciated. The patient proceeded to undergo fitting for her vaginal cylinder. Optimal diameter cylinder was a 3.0 cm diameter cylinder. The patient tolerated the procedure well.   Simple treatment device note   The patient had construction of her custom vaginal cylinder. She will be treated with three 3.0 cm rings placed proximally within the vagina. More distal will be two 2.5 cm rings. This arrangement distended the vaginal vault without undue discomfort.   Verification simulation note  The vaginal cylinder was accessed and a fiducial marker was placed within the cylinder. She proceeded to undergo an AP and lateral film. This verified that the cylinder was in the correct position within the pelvis when compared to her planning films last week.   . High-dose-rate brachytherapy procedure note  The remote afterloading catheter was attached to the cylinder. The patient proceeded to undergo her second high-dose-rate treatment. She was prescribed a dose of 6 Gy to be delivered to the proximal mucosal surface. This was achieved with 1 channel using 9 dwell positions.  Treatment length was 4 cm. A total treatment time was 310 seconds. After completion of the patient's radiation therapy a radiation survey was performed documenting return of the iridium source into the Nucletron safe. The patient  tolerated the procedure well. She will return in one week for her third high-dose-rate treatment. -----------------------------------   Billie Lade, PhD, MD

## 2013-10-31 ENCOUNTER — Telehealth: Payer: Self-pay | Admitting: Oncology

## 2013-10-31 ENCOUNTER — Ambulatory Visit: Payer: Medicare Other | Attending: Gynecology | Admitting: Gynecology

## 2013-10-31 ENCOUNTER — Encounter: Payer: Self-pay | Admitting: Gynecology

## 2013-10-31 VITALS — BP 149/87 | HR 85 | Temp 98.1°F | Resp 16 | Ht 66.0 in | Wt 154.1 lb

## 2013-10-31 DIAGNOSIS — Z9079 Acquired absence of other genital organ(s): Secondary | ICD-10-CM | POA: Insufficient documentation

## 2013-10-31 DIAGNOSIS — E785 Hyperlipidemia, unspecified: Secondary | ICD-10-CM | POA: Insufficient documentation

## 2013-10-31 DIAGNOSIS — Z79899 Other long term (current) drug therapy: Secondary | ICD-10-CM | POA: Insufficient documentation

## 2013-10-31 DIAGNOSIS — G609 Hereditary and idiopathic neuropathy, unspecified: Secondary | ICD-10-CM | POA: Insufficient documentation

## 2013-10-31 DIAGNOSIS — I10 Essential (primary) hypertension: Secondary | ICD-10-CM | POA: Insufficient documentation

## 2013-10-31 DIAGNOSIS — Z90710 Acquired absence of both cervix and uterus: Secondary | ICD-10-CM | POA: Insufficient documentation

## 2013-10-31 DIAGNOSIS — C549 Malignant neoplasm of corpus uteri, unspecified: Secondary | ICD-10-CM | POA: Insufficient documentation

## 2013-10-31 DIAGNOSIS — Z923 Personal history of irradiation: Secondary | ICD-10-CM | POA: Insufficient documentation

## 2013-10-31 DIAGNOSIS — C541 Malignant neoplasm of endometrium: Secondary | ICD-10-CM

## 2013-10-31 DIAGNOSIS — Z9221 Personal history of antineoplastic chemotherapy: Secondary | ICD-10-CM | POA: Insufficient documentation

## 2013-10-31 NOTE — Patient Instructions (Signed)
Return to see me in 3 months. 

## 2013-10-31 NOTE — Progress Notes (Signed)
Consult Note: Gyn-Onc   Misty Alvarado 79 y.o. female  Chief Complaint  Patient presents with  . 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 Currently receiving high dose rate brachytherapy.  Peripheral neuropathy in her feet.  Plan: Complete vaginal vault brachytherapy in 2 weeks. Prescription for vitamin B6 for neuropathy 100 mg daily. Return to see me in 3 months.  Interval history. The patient returns today for continuing followup. She is now completed chemotherapy and has 2 more vaginal vault break E. therapy to accomplish. Overall she's doing well. She comes accompanied by her niece. Her only complaint is that of peripheral neuropathy in her feet. She denies any nausea vomiting and has no GI or GU symptoms. Overall her functional status has returned to normal.  HPI: Patient initially presented with postmenopausal bleeding. A biopsy showed endometrial cancer and she underwent a robotic assisted hysterectomy, BSO, and pelvic lymphadenectomy on Apr 24 2013. Final pathology showed a stage I a papillary serous endometrial carcinoma with lymph vascular invasion.  Adjuvant chemotherapy with carboplatin and Taxol as well as vaginal vault brachytherapy was recommended.  Review of Systems:10 point review of systems is negative except as noted in interval history.   Vitals: Blood pressure 149/87, pulse 85, temperature 98.1 F (36.7 C), temperature source Oral, resp. rate 16, height 5\' 6"  (1.676 m), weight 154 lb 1.6 oz (69.899 kg).  Physical Exam: General : The patient is a healthy woman in no acute distress. She has alopecia. HEENT: normocephalic, extraoccular movements normal; neck is supple without thyromegally  Lynphnodes: Supraclavicular and inguinal nodes not enlarged  Abdomen: Soft, non-tender, no ascites, no organomegally, no masses, no hernias all incisions are healing well Pelvic:   EGBUS vagina bladder urethra are normal  Cervix and uterus are surgically absent  Bimanual rectovaginal exam revealed no masses induration or nodularity.   Lower extremities: No edema or varicosities. Normal range of motion      Allergies  Allergen Reactions  . Dexamethasone Other (See Comments)    Mood disturbance, psychosis  . Rocephin [Ceftriaxone Sodium In Dextrose] Rash    Past Medical History  Diagnosis Date  . ALLERGIC RHINITIS 10/03/2007  . HYPERLIPIDEMIA 10/03/2007  . HYPERTENSION 10/03/2007  . Ulcer     gastric  . GERD (gastroesophageal reflux disease)     "ONCE IN A WHILE"  TUMS IF NEEDED  . Arthritis     KNEES  . Endometrial cancer     new diagnosis 03/02/13    Past Surgical History  Procedure Laterality Date  . Cardiac catheterization    . Tubal ligation    . Lithotripsy      for kidney stone  . Partial thyroidectomy for a growth      YRS AGO   . Robotic assisted total hysterectomy with bilateral salpingo oopherectomy Bilateral 04/24/2013    Procedure: ROBOTIC ASSISTED TOTAL HYSTERECTOMY WITH BILATERAL SALPINGO OOPHORECTOMY,  LYMPH NODE DISSECTION;  Surgeon: Rejeana Brock A. Duard Brady, MD;  Location: [facility];  Service: Gynecology;  Laterality: Bilateral;  . Lymph node dissection N/A 04/24/2013    Procedure: LYMPH NODE DISSECTION;  Surgeon: Rejeana Brock A. Duard Brady, MD;  Location: [facility];  Service: Gynecology;  Laterality: N/A;  . Abdominal hysterectomy    . Back surgery  1968    Ruptured disc repair    Current Outpatient Prescriptions  Medication Sig Dispense Refill  . gabapentin (NEURONTIN) 100 MG capsule Take 1  capsule (100 mg total) by mouth at bedtime. For neuropathy.  15 capsule  1  . haloperidol (HALDOL) 1 MG tablet Take 1 tablet (1 mg total) by mouth 2 (two) times daily. 8:00 am and 8:00 pm  60 tablet  2  . HYDROcodone-acetaminophen (NORCO) 10-325 MG per tablet Take 1-2 tablets by mouth 2 (two) times daily as needed for pain.  90 tablet  0  .  hydrocortisone-pramoxine (PROCTOFOAM-HC) rectal foam Place 1 applicator rectally 3 (three) times daily. Use internally in the rectum.  10 g  1  . hyoscyamine (LEVBID) 0.375 MG 12 hr tablet Take 1 tablet (0.375 mg total) by mouth every 12 (twelve) hours as needed for cramping.  90 tablet  0  . lidocaine (XYLOCAINE) 2 % solution Mix with desitin and apply to external hemorrhoids 2-3 times a day  100 mL  0  . LORazepam (ATIVAN) 0.5 MG tablet Take 1-2 tabs  under the tongue or swallow  Every 6 hrs. As needed for nausea.  Will make you drowsy  20 tablet  0  . lubiprostone (AMITIZA) 8 MCG capsule Take 1 capsule (8 mcg total) by mouth 2 (two) times daily with a meal.  60 capsule  0  . mometasone (NASONEX) 50 MCG/ACT nasal spray Place 2 sprays into the nose daily as needed (for allergies).      . ondansetron (ZOFRAN) 8 MG tablet Take 1-2 tablets (8-16 mg total) by mouth every 12 (twelve) hours as needed for nausea (Will not make you drowsy).  20 tablet  2   No current facility-administered medications for this visit.    History   Social History  . Marital Status: Widowed    Spouse Name: N/A    Number of Children: N/A  . Years of Education: N/A   Occupational History  . Not on file.   Social History Main Topics  . Smoking status: Never Smoker   . Smokeless tobacco: Never Used  . Alcohol Use: No  . Drug Use: No  . Sexual Activity: Not on file   Other Topics Concern  . Not on file   Social History Narrative  . No narrative on file    Family History  Problem Relation Age of Onset  . Arthritis Mother   . Colon cancer Neg Hx   . Cancer Sister     1 deceased from lung cancer  . Cancer Brother     2 deceased from lung cancer      Jeannette Corpus, MD 10/31/2013, 2:18 PM

## 2013-10-31 NOTE — Telephone Encounter (Signed)
s.w. pt and advised on Dec appt moved to [facility] pt letter and avs

## 2013-11-02 ENCOUNTER — Telehealth: Payer: Self-pay | Admitting: *Deleted

## 2013-11-02 ENCOUNTER — Telehealth: Payer: Self-pay | Admitting: Internal Medicine

## 2013-11-02 DIAGNOSIS — C541 Malignant neoplasm of endometrium: Secondary | ICD-10-CM

## 2013-11-02 NOTE — Telephone Encounter (Signed)
Pt needs new rx hydrocodone and also new rx haldol 1 mg #180 sent to [facility].

## 2013-11-02 NOTE — Telephone Encounter (Signed)
Called patient's daughter, Marin Roberts,  to remind of tx. For 11-05-13 at 10:30 am, spoke with patient's daughter and she is aware of this tx.

## 2013-11-02 NOTE — Telephone Encounter (Signed)
Spoke to pt's daughter [name] told her Rx for Haldol was called into [facility] and she should have refills yet, and Rx for Hydrocodone will need to be picked up and will be ready on Monday. [name] verbalized understanding.

## 2013-11-05 ENCOUNTER — Ambulatory Visit
Admission: RE | Admit: 2013-11-05 | Discharge: 2013-11-05 | Disposition: A | Discharge status: Home / Self Care | Payer: Medicare Other | Source: Ambulatory Visit | Attending: Radiation Oncology | Admitting: Radiation Oncology

## 2013-11-05 ENCOUNTER — Encounter: Payer: Self-pay | Admitting: Radiation Oncology

## 2013-11-05 DIAGNOSIS — C541 Malignant neoplasm of endometrium: Secondary | ICD-10-CM

## 2013-11-05 MED ORDER — HYDROCODONE-ACETAMINOPHEN 10-325 MG PO TABS: 1.0000 | ORAL_TABLET | Freq: Two times a day (BID) | ORAL | Status: DC | PRN

## 2013-11-05 NOTE — Telephone Encounter (Signed)
Rx printed and signed, put at front desk for pick up. 

## 2013-11-05 NOTE — Progress Notes (Signed)
   Department of Radiation Oncology  Phone:  [phone] Fax:        [phone]  Department of Radiation Oncology  Phone: [phone]  Fax: [phone]   High dose rate radiation procedure note  CC: Rogelia Boga, MD Reece Packer, MD   Diagnosis: Stage T1a N0 high grade serous endometrial carcinoma with lymphovascular space invasion   Vaginal brachytherapy procedure: The patient returns today for Her third vaginal brachytherapy treatment. She was placed in the dorsolithotomy position. A pelvic examination was performed. There are no mucosal lesions noted in the vaginal vault. The vaginal cuff is intact. On pelvic examination there are no pelvic masses appreciated. The patient proceeded to undergo fitting for her vaginal cylinder. Optimal diameter cylinder was a 3.0 cm diameter cylinder. The patient tolerated the procedure well.   Simple treatment device note   The patient had construction of her custom vaginal cylinder. She will be treated with three 3.0 cm rings placed proximally within the vagina. More distal will be two 2.5 cm rings. This arrangement distended the vaginal vault without undue discomfort.   Verification simulation note   The vaginal cylinder was accessed and a fiducial marker was placed within the cylinder. She proceeded to undergo an AP and lateral film. This verified that the cylinder was in the correct position within the pelvis when compared to her planning films last week.   . High-dose-rate brachytherapy procedure note  The remote afterloading catheter was attached to the cylinder. The patient proceeded to undergo her third high-dose-rate treatment. She was prescribed a dose of 6 Gy to be delivered to the proximal mucosal surface. This was achieved with 1 channel using 9 dwell positions. Treatment length was 4 cm. A total treatment time was 331.6 seconds. After completion of the patient's radiation therapy a radiation survey was performed  documenting return of the iridium source into the Nucletron safe. The patient tolerated the procedure well. She will return in one week for her fourth high-dose-rate treatment.   -----------------------------------   Billie Lade, PhD, MD

## 2013-11-06 ENCOUNTER — Telehealth: Payer: Self-pay | Admitting: Oncology

## 2013-11-06 NOTE — Telephone Encounter (Signed)
Called Misty Alvarado and let her know that Dr. Roselind Messier believes [name] has a lot of hard stool and may need to be disimpacted.  Misty Alvarado said that this was resolved and her mother had been to see a GI doctor so she does not think this is the reason for the diarrhea.

## 2013-11-06 NOTE — Telephone Encounter (Signed)
Madden's daughter, [name], called and said that Jonae is having watery diarrhea.  She changed her at least 12 times yesterday.  She also started taking Imodium.  Sharese is also not eating because she thinks it will cause the diarrhea.  Will forward to Dr. Roselind Messier.

## 2013-11-06 NOTE — Telephone Encounter (Signed)
Called Misty Alvarado back to let her know that the hard stool was found on exam by Dr. Roselind Messier at [facility] last HDR treatment.  Misty Alvarado said she was going to give her mother mag citrate that she had from the last time her mother had an impaction.  Advised her to make sure that Misty Alvarado is not dehydrated due to the diarrhea she has been having.  Misty Alvarado said she will push fluids and will monitor her closely.

## 2013-11-07 ENCOUNTER — Telehealth: Payer: Self-pay | Admitting: *Deleted

## 2013-11-07 NOTE — Telephone Encounter (Signed)
CALLED PATIENT'S DAUGHTER TO REMIND OF HDR TX. ON 11-12-13 AT 10 AM, SPOKE WITH PATIENT'S DAUGHTER AND SHE IS AWARE OF THIS APPT.

## 2013-11-12 ENCOUNTER — Ambulatory Visit
Admission: RE | Admit: 2013-11-12 | Discharge: 2013-11-12 | Disposition: A | Discharge status: Home / Self Care | Payer: Medicare Other | Source: Ambulatory Visit | Attending: Radiation Oncology | Admitting: Radiation Oncology

## 2013-11-12 DIAGNOSIS — C541 Malignant neoplasm of endometrium: Secondary | ICD-10-CM

## 2013-11-12 NOTE — Progress Notes (Signed)
    Department of Radiation Oncology  Phone: [phone]  Fax: [phone]   High dose rate radiation procedure note   CC: Rogelia Boga, MD Reece Packer, MD   Diagnosis: Stage T1a N0 high grade serous endometrial carcinoma with lymphovascular space invasion   Vaginal brachytherapy procedure: The patient returns today for Her fourth vaginal brachytherapy treatment. She was placed in the dorsolithotomy position. A pelvic examination was performed. There are no mucosal lesions noted in the vaginal vault. The vaginal cuff is intact. On pelvic examination there are no pelvic masses appreciated. The patient proceeded to undergo fitting for her vaginal cylinder. Optimal diameter cylinder was a 3.0 cm diameter cylinder. The patient tolerated the procedure well.   Simple treatment device note   The patient had construction of her custom vaginal cylinder. She will be treated with three 3.0 cm rings placed proximally within the vagina. More distal will be two 2.5 cm rings. This arrangement distended the vaginal vault without undue discomfort.   Verification simulation note   The vaginal cylinder was accessed and a fiducial marker was placed within the cylinder. She proceeded to undergo an AP and lateral film. This verified that the cylinder was in the correct position within the pelvis when compared to her planning films last week.   . High-dose-rate brachytherapy procedure note   The remote afterloading catheter was attached to the cylinder. The patient proceeded to undergo her fourth high-dose-rate treatment. She was prescribed a dose of 6 Gy to be delivered to the proximal mucosal surface. This was achieved with 1 channel using 9 dwell positions. Treatment length was 4 cm. A total treatment time was 353.9 seconds. After completion of the patient's radiation therapy a radiation survey was performed documenting return of the iridium source into the Nucletron safe. The patient  tolerated the procedure well. She will return in one week for her fourth high-dose-rate treatment.  -----------------------------------   Billie Lade, PhD, MD

## 2013-11-14 ENCOUNTER — Telehealth: Payer: Self-pay | Admitting: Oncology

## 2013-11-14 ENCOUNTER — Telehealth: Payer: Self-pay | Admitting: Dietician

## 2013-11-14 NOTE — Telephone Encounter (Signed)
Brief Outpatient Oncology Nutrition Note  Patient has been identified to be at risk on malnutrition screen.  Wt Readings from Last 10 Encounters:  10/31/13 154 lb 1.6 oz (69.899 kg)  10/22/13 158 lb 3.2 oz (71.759 kg)  10/10/13 156 lb 1.6 oz (70.806 kg)  09/26/13 166 lb 6 oz (75.467 kg)  09/26/13 168 lb 6.4 oz (76.386 kg)  09/17/13 167 lb 11.2 oz (76.068 kg)  09/12/13 173 lb 14.4 oz (78.881 kg)  09/03/13 175 lb (79.379 kg)  08/10/13 185 lb 9.6 oz (84.188 kg)  08/02/13 187 lb 8 oz (85.049 kg)    Diagnosis:  Endometrial cancer UBW:  210 lbs (May 2014)  Called patient due to continued weight loss.  Patient was seen by inpatient RD 07/15/2013.  Weight was 197.5 lbs at that time.  She was diagnosed with severe malnutrition related to chronic illness AEB 11% weight in the past 3 months and decreased intake.  Since that time, patient has lost 26% of her usual weight in a six month time period.    Called patient who was unavailable.  Left message recommending an [facility] and provided her contact information.    Oran Rein, RD, LDN

## 2013-11-14 NOTE — Telephone Encounter (Signed)
lvm for pt regarding to nut appt on 12.8.14

## 2013-11-16 ENCOUNTER — Telehealth: Payer: Self-pay | Admitting: *Deleted

## 2013-11-16 ENCOUNTER — Telehealth: Payer: Self-pay | Admitting: Nutrition

## 2013-11-16 NOTE — Telephone Encounter (Signed)
Patient's daughter called RD back.  Patient is "stubborn" and has not taken family advice regarding nutrition and increasing oral intake.  Daughter does not feel appointment would be beneficial. Appetite is improved. Oral intake increased. Patient is eating 3 meals and snacking.  Refuses Ensure oral nutrition supplement.  Patient reports foods taste bitter.  Noted pt does have nutrition appointment scheduled on Monday, Dec 8.  Daughter is not sure patient will come.  Assured daughter that they can cancel it if pt refuses or I would be happy to see pt.  Daughter verbalized appreciation.

## 2013-11-16 NOTE — Telephone Encounter (Signed)
XXX

## 2013-11-16 NOTE — Telephone Encounter (Signed)
CALLED PATIENT TO REMIND OF HDR TX. FOR 11-19-13, LVM FOR A RETURN CALL

## 2013-11-19 ENCOUNTER — Ambulatory Visit
Admission: RE | Admit: 2013-11-19 | Discharge: 2013-11-19 | Disposition: A | Discharge status: Home / Self Care | Payer: Medicare Other | Source: Ambulatory Visit | Attending: Radiation Oncology | Admitting: Radiation Oncology

## 2013-11-19 ENCOUNTER — Ambulatory Visit: Payer: Medicare Other | Admitting: Nutrition

## 2013-11-19 DIAGNOSIS — C541 Malignant neoplasm of endometrium: Secondary | ICD-10-CM

## 2013-11-19 NOTE — Progress Notes (Signed)
Department of Radiation Oncology  Phone: [phone]  Fax: [phone]   High dose rate radiation procedure note   CC: Rogelia Boga, MD Reece Packer, MD   Diagnosis: Stage T1a N0 high grade serous endometrial carcinoma with lymphovascular space invasion   Vaginal brachytherapy procedure: The patient returns today for Her fifth vaginal brachytherapy treatment. She was placed in the dorsolithotomy position. A pelvic examination was performed. There are no mucosal lesions noted in the vaginal vault. The vaginal cuff is intact. On pelvic examination there are no pelvic masses appreciated. The patient proceeded to undergo fitting for her vaginal cylinder. Optimal diameter cylinder was a 3.0 cm diameter cylinder. The patient tolerated the procedure well.   Simple treatment device note   The patient had construction of her custom vaginal cylinder. She will be treated with three 3.0 cm rings placed proximally within the vagina. More distal will be two 2.5 cm rings. This arrangement distended the vaginal vault without undue discomfort.   Verification simulation note   The vaginal cylinder was accessed and a fiducial marker was placed within the cylinder. She proceeded to undergo an AP and lateral film. This verified that the cylinder was in the correct position within the pelvis when compared to her planning films last week.   . High-dose-rate brachytherapy procedure note   The remote afterloading catheter was attached to the cylinder. The patient proceeded to undergo her fifth high-dose-rate treatment. She was prescribed a dose of 6 Gy to be delivered to the proximal mucosal surface. This was achieved with 1 channel using 9 dwell positions. Treatment length was 4 cm. A total treatment time was 378.2 seconds. After completion of the patient's radiation therapy a radiation survey was performed documenting return of the iridium source into the Nucletron safe. The patient tolerated the  procedure well. She will be scheduled for routine followup in one month. -----------------------------------   Billie Lade, PhD, MD

## 2013-11-19 NOTE — Progress Notes (Signed)
Patient and daughter present to nutrition consult.  She is a 79 year old female patient diagnosed with endometrial cancer.  She is a patient of Dr. Darrold Span and Dr. Roselind Messier.  Patient finishes her treatment today.  Past medical history includes hyperlipidemia, hypertension, ulcer, GERD, and arthritis.  Medications include Haldol, Ativan, and Zofran.  Labs were reviewed.  Height: 66 inches. Weight: 152.2 pounds. Usual body weight: 202 pounds July 2014. BMI: 24.57.  Patient reports poor appetite.  She describes taste alterations with some foods tasting bitter.  Primarily consumes breakfast and dinner but does not eat much in between meals.  She does not really care for the oral nutrition supplements she has tried.  Patient has lost 25% of her usual body weight over the past 6 months.  Patient meets criteria for severe malnutrition in the context of chronic illness secondary to greater than 10% weight loss over 6 months and less than 75% of estimated energy requirements for greater than one month.  Patient also is shows signs of muscle and fat depletion.  Nutrition diagnosis: Unintended weight loss related to poor appetite and taste alterations as evidenced by 25% weight loss over 6 months.  Intervention: Patient was educated to increase oral intake in small amounts throughout the day.  I've educated her on foods to improve taste alterations.  I have given her strategies on ways to increase calories and protein.  I've recommended patient try Carnation breakfast essentials between meals.  I've also given her samples of other oral nutrition supplements and flavors, including juice-based oral supplements, to continue to work into her daily eating regimen.  Patient was provided with fact sheets, coupons, and my contact information, along with her samples.  Questions were answered.  Teach back method used.  Monitoring, evaluation, goals: Patient will increase oral intake to promote weight  maintenance.  Next visit: Patient will contact me with questions or concerns.

## 2013-12-02 ENCOUNTER — Encounter: Payer: Self-pay | Admitting: Radiation Oncology

## 2013-12-02 NOTE — Progress Notes (Signed)
  Radiation Oncology         [phone] ________________________________  Name: Misty Alvarado MRN: [id number]  Date: 12/02/2013  DOB: 1933/08/03  End of Treatment Note  Diagnosis:    Stage T1a N0 high grade serous endometrial carcinoma with lymphovascular space invasion     Indication for treatment:  Postop with risk for vaginal cuff recurrence       Radiation treatment dates:   November 10, November 17, November 24, December 1, December 8  Site/dose:   Proximal vagina 30 gray in 5 fractions  Beams/energy:   Intracavitary brachytherapy treatments using iridium 192 as the high-dose-rate source.  A 3 cm diameter cylinder was used for treatment with a 4 CM treatment length.  Narrative: The patient tolerated radiation treatment relatively well.   She had some mild fatigue during the course of treatment.. she did have some diarrhea during the early portion of her treatment which was likely unrelated to her therapy. She denied any bladder complaints.  Plan: The patient has completed radiation treatment. The patient will return to radiation oncology clinic for routine followup in one month. I advised them to call or return sooner if they have any questions or concerns related to their recovery or treatment.  -----------------------------------  Billie Lade, PhD, MD

## 2013-12-10 ENCOUNTER — Other Ambulatory Visit: Payer: Self-pay

## 2013-12-10 ENCOUNTER — Other Ambulatory Visit: Payer: Self-pay | Admitting: Gastroenterology

## 2013-12-10 MED ORDER — HYOSCYAMINE SULFATE ER 0.375 MG PO TB12: ORAL_TABLET | ORAL | Status: DC

## 2013-12-10 NOTE — Telephone Encounter (Signed)
May I refill ? 

## 2013-12-10 NOTE — Telephone Encounter (Signed)
Ok per [employer] to refill.

## 2013-12-10 NOTE — Telephone Encounter (Signed)
Ok to refill.  Misty Alvarado 

## 2013-12-10 NOTE — Telephone Encounter (Signed)
Refill sent in

## 2013-12-11 ENCOUNTER — Telehealth: Payer: Self-pay | Admitting: Internal Medicine

## 2013-12-11 DIAGNOSIS — C541 Malignant neoplasm of endometrium: Secondary | ICD-10-CM

## 2013-12-11 MED ORDER — HALOPERIDOL 1 MG PO TABS: 1.0000 mg | ORAL_TABLET | Freq: Two times a day (BID) | ORAL | Status: DC

## 2013-12-11 MED ORDER — HYDROCODONE-ACETAMINOPHEN 10-325 MG PO TABS: 1.0000 | ORAL_TABLET | Freq: Two times a day (BID) | ORAL | Status: DC | PRN

## 2013-12-11 NOTE — Telephone Encounter (Signed)
Pt request refill of HYDROcodone-acetaminophen (NORCO) 10-325 MG per tablet Pt would like haloperidol (HALDOL) 1 MG tablet Called into [facility] (when due) Daughter asked if she could pu hydro rx  today. Advised daughter you will call her.

## 2013-12-11 NOTE — Telephone Encounter (Signed)
Spoke to pt's daughter [name] told her Rx for Hydrocodone and Haldol are ready for pickup, will be at the front desk. Rx's printed and signed and put at the front desk.

## 2013-12-12 ENCOUNTER — Other Ambulatory Visit: Payer: Self-pay | Admitting: Lab

## 2013-12-12 ENCOUNTER — Ambulatory Visit: Payer: Self-pay | Admitting: Oncology

## 2013-12-16 ENCOUNTER — Other Ambulatory Visit: Payer: Self-pay | Admitting: Oncology

## 2013-12-17 ENCOUNTER — Other Ambulatory Visit (HOSPITAL_BASED_OUTPATIENT_CLINIC_OR_DEPARTMENT_OTHER): Payer: Medicare Other

## 2013-12-17 ENCOUNTER — Encounter: Payer: Self-pay | Admitting: Oncology

## 2013-12-17 ENCOUNTER — Telehealth: Payer: Self-pay | Admitting: Oncology

## 2013-12-17 ENCOUNTER — Ambulatory Visit (HOSPITAL_BASED_OUTPATIENT_CLINIC_OR_DEPARTMENT_OTHER): Payer: Medicare Other | Admitting: Oncology

## 2013-12-17 VITALS — BP 154/79 | HR 87 | Temp 98.5°F | Resp 20 | Wt 153.4 lb

## 2013-12-17 DIAGNOSIS — T451X5A Adverse effect of antineoplastic and immunosuppressive drugs, initial encounter: Secondary | ICD-10-CM

## 2013-12-17 DIAGNOSIS — C541 Malignant neoplasm of endometrium: Secondary | ICD-10-CM

## 2013-12-17 DIAGNOSIS — C549 Malignant neoplasm of corpus uteri, unspecified: Secondary | ICD-10-CM

## 2013-12-17 DIAGNOSIS — D72819 Decreased white blood cell count, unspecified: Secondary | ICD-10-CM

## 2013-12-17 DIAGNOSIS — D6481 Anemia due to antineoplastic chemotherapy: Secondary | ICD-10-CM

## 2013-12-17 LAB — CBC WITH DIFFERENTIAL/PLATELET
BASO%: 0.8 % (ref 0.0–2.0)
Basophils Absolute: 0 10*3/uL (ref 0.0–0.1)
EOS%: 10.6 % — ABNORMAL HIGH (ref 0.0–7.0)
Eosinophils Absolute: 0.5 10*3/uL (ref 0.0–0.5)
HCT: 32 % — ABNORMAL LOW (ref 34.8–46.6)
HGB: 11 g/dL — ABNORMAL LOW (ref 11.6–15.9)
LYMPH%: 27 % (ref 14.0–49.7)
MCH: 32.7 pg (ref 25.1–34.0)
MCHC: 34.5 g/dL (ref 31.5–36.0)
MCV: 94.8 fL (ref 79.5–101.0)
MONO#: 0.3 10*3/uL (ref 0.1–0.9)
MONO%: 6 % (ref 0.0–14.0)
NEUT#: 2.4 10*3/uL (ref 1.5–6.5)
NEUT%: 55.6 % (ref 38.4–76.8)
Platelets: 203 10*3/uL (ref 145–400)
RBC: 3.38 10*6/uL — AB (ref 3.70–5.45)
RDW: 13.4 % (ref 11.2–14.5)
WBC: 4.3 10*3/uL (ref 3.9–10.3)
lymph#: 1.2 10*3/uL (ref 0.9–3.3)

## 2013-12-17 LAB — COMPREHENSIVE METABOLIC PANEL (CC13)
ALBUMIN: 3.7 g/dL (ref 3.5–5.0)
ALT: 8 U/L (ref 0–55)
AST: 14 U/L (ref 5–34)
Alkaline Phosphatase: 54 U/L (ref 40–150)
Anion Gap: 11 mEq/L (ref 3–11)
BUN: 17 mg/dL (ref 7.0–26.0)
CO2: 27 mEq/L (ref 22–29)
Calcium: 9.4 mg/dL (ref 8.4–10.4)
Chloride: 103 mEq/L (ref 98–109)
Creatinine: 1 mg/dL (ref 0.6–1.1)
Glucose: 144 mg/dl — ABNORMAL HIGH (ref 70–140)
Potassium: 4.3 mEq/L (ref 3.5–5.1)
SODIUM: 141 meq/L (ref 136–145)
Total Bilirubin: 0.3 mg/dL (ref 0.20–1.20)
Total Protein: 6.7 g/dL (ref 6.4–8.3)

## 2013-12-17 MED ORDER — GABAPENTIN 300 MG PO CAPS: 300.0000 mg | ORAL_CAPSULE | Freq: Every day | ORAL | Status: DC

## 2013-12-17 NOTE — Progress Notes (Signed)
OFFICE PROGRESS NOTE   12/17/2013   Physicians:D.ClarkePearson, J.Kinard, P. KWIATKOWSKI, Megan Morris , C.Gessner   INTERVAL HISTORY:  Patient is seen, together with daughter, in follow up of recently completed adjuvant treatment for endometrial cancer. She had a very difficult time tolerating treatment, but was able to receive total 5 of planned 6 cycles of chemotherapy given from 05-30-2013 thru 09-06-2013; she completed five HDR treatments by Dr Sondra Come as of 11-19-13. Last CT AP was 07-14-2013. She is to see Dr Sondra Come on 12-24-13 and Dr Josephina Shih in late Feb. Patient has felt gradually better since therapy was finished. 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 She seemed more comfortable with neurontin at 300 mg at hs (daughter increased this from last prescription herself), but has needed norco 2 tablets at hs since finished the neurontin. She has no neuropathy in hands. Appetite is gradually improving, without nausea or vomiting. She is still having some constipation (note hs norco) and some fecal incontinence (previously related to constipation); we have reviewed that problem. She is up most of day now, more active in house tho does not attempt stairs.   She is still on bid haldol, from PCP. This was new medication early in chemotherapy course.  No PAC.  ONCOLOGIC HISTORY atient had several weeks of vaginal spotting in early 2014, without other symptoms. She had endometrial biopsy 03-02-2012 [phone]) with grade 1 endometroid adenocarcinoma. She was referred to Dr Josephina Shih, his exam not remarkable otherwise. She had colonoscopy by Dr Carlean Purl on 04-23-13, with a diminutive adenoma and otherwise benign. CXR preoperatively 04-20-2013 with chronic bronchitic and interstitial changes and degenerative changes of spine, otherwise not remarkable. She was taken to total robotic hysterectomy with BSO and bilateral pelvic  node evaluation by Dr Alycia Rossetti on 04-24-2013. Post operative course was uncomplicated and she was discharged home on POD 1. 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 Recommendation was for 6 cycles of adjuvant taxol/ carboplatin followed by vaginal vault brachytherapy. Cycle 1 taxol carbo was 0000000, complicated by apparent steroid psychosis, severe aching and neutropenia; she remained on haldol scheduled dosing for remainder of chemotherapy. Cycle 2 was carboplatin only on 06-20-13; she was hospitalized 7-11 and 06-23-13 with dehydration and decreased blood pressure. Cycle 3 taxol at 135 mg/m2 + carboplatin was given on 07-11-13, with hospitalization 8-1 thru 07-17-13 for new atrial fibrillation with RVR. Cycle 4 delayed x 1 week for counts, given 08-06-13 with taxol at 135 mg/m2 and carbo dose reduced. Cycle 5 was given 09-06-13 after further delay for counts, neutropenic despite neupogen that cycle. Peripheral neuropathy was significantly worse after cycle 5, and overall performance status poor to the point that cycle 6 was cancelled.  She had CT AP 07-14-13 during hospitalization, without imaging evidence of malignancy.    Review of systems as above, also: No new or different abdominal or pelvic discomfort. No bleeding. No LE swelling. Denies SOB or other respiratory symptoms. She does sleep after norco at night. She was not more sedated during day after gabapentin 300 mg at hs. Remainder of 10 point Review of Systems negative.  Objective:  Vital signs in last 24 hours:  BP 154/79  Pulse 87  Temp(Src) 98.5 F (36.9 C)  Resp 20  Wt 153 lb 7 oz (69.599  kg) weight is up 1 lb  Alert, mostly blunted affect, but engaging a bit more in conversation and smiling at times. Ambulatory with minimal assistance.     HEENT:PERRL, sclerae not icteric. Oral mucosa moist without lesions, posterior pharynx clear.  Neck without JVD.  Lymphatics:no cervical,suraclavicular adenopathy Resp: clear to auscultation bilaterally  Cardio: regular rate and rhythm. No gallop. GI: soft, nontender, not distended. Few bowel sounds.  Musculoskeletal/ Extremities: without pitting edema, cords, tenderness  Neuro: speech fluent and appropriate. Neuropathy in feet. No clear Parkinsonian movements. Skin without rash, ecchymosis, petechiae No PAC  Lab Results:  Results for orders placed in visit on 12/17/13  CBC WITH DIFFERENTIAL      Result Value Range   WBC 4.3  3.9 - 10.3 10e3/uL   NEUT# 2.4  1.5 - 6.5 10e3/uL   HGB 11.0 (*) 11.6 - 15.9 g/dL   HCT 32.0 (*) 34.8 - 46.6 %   Platelets 203  145 - 400 10e3/uL   MCV 94.8  79.5 - 101.0 fL   MCH 32.7  25.1 - 34.0 pg   MCHC 34.5  31.5 - 36.0 g/dL   RBC 3.38 (*) 3.70 - 5.45 10e6/uL   RDW 13.4  11.2 - 14.5 %   lymph# 1.2  0.9 - 3.3 10e3/uL   MONO# 0.3  0.1 - 0.9 10e3/uL   Eosinophils Absolute 0.5  0.0 - 0.5 10e3/uL   Basophils Absolute 0.0  0.0 - 0.1 10e3/uL   NEUT% 55.6  38.4 - 76.8 %   LYMPH% 27.0  14.0 - 49.7 %   MONO% 6.0  0.0 - 14.0 %   EOS% 10.6 (*) 0.0 - 7.0 %   BASO% 0.8  0.0 - 2.0 %  COMPREHENSIVE METABOLIC PANEL (FU93)      Result Value Range   Sodium 141  136 - 145 mEq/L   Potassium 4.3  3.5 - 5.1 mEq/L   Chloride 103  98 - 109 mEq/L   CO2 27  22 - 29 mEq/L   Glucose 144 (*) 70 - 140 mg/dl   BUN 17.0  7.0 - 26.0 mg/dL   Creatinine 1.0  0.6 - 1.1 mg/dL   Total Bilirubin 0.30  0.20 - 1.20 mg/dL   Alkaline Phosphatase 54  40 - 150 U/L   AST 14  5 - 34 U/L   ALT 8  0 - 55 U/L   Total Protein 6.7  6.4 - 8.3 g/dL   Albumin 3.7  3.5 - 5.0 g/dL   Calcium 9.4  8.4 - 10.4 mg/dL   Anion Gap 11  3 - 11 mEq/L   CBC improved compared with 10-10-13. CMET available after visit, not fasting.  Studies/Results:  No results found.  Medications: I have  reviewed the patient's current medications. We will resume neurontin at 300 mg qhs, instead of pain medication at hs. Haldol per Dr Nonie Hoyer, whom daughter expects to see her in ~ March.  DISCUSSION: She will keep appointments with other MDs as above. I will see her with repeat counts in April, then may go to prn at this office if stable and depending on schedule with other MDs.  Assessment/Plan: 1. 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 Resume gabapentin at hs for taxol neuropathy. Vaginal brachytherapy by Dr Sondra Come completed 11-19-13. Follow up  with Drs Sondra Come and ClarkePearson as scheduled. I will see her with labs April as above. 2.extreme agitation and paranoia after cycle 1 chemo, likely steroid exacerbation of previous tendencies. Bid haldol by PCP Dr Nonie Hoyer helpful then, still using this.  3.Fecal incontinence related to significant constipation and hemorrhoids during treatment: not entirely resolved, hopefully can DC norco. 4.HTN x years. antihypertensives held for low BP during treatment. BP still seems ok off meds for now, may be ok to resume when she sees PCP upcoming. 5.degenerative arthritis knees stable 6.allergic rhinitis not bothersome now  7.unremarkable colonoscopy and mammograms around time of cancer diagnosis  8. flu shot done. Mentioned flu avoidance/ strict handwashing and tamiflu if needed. 9.paroxysmal a fib with RVR: spontaneously resolved, regular rate now 10.anemia and leukopenia related to chemotherapy improving. Repeat counts with my next visit.   Patient and daughter are in agreement with plan and have had all questions answered. Time spent 25 min including >50% counseling and coordination of care.    LIVESAY,LENNIS P, MD    12/17/2013, 1:07 PM

## 2013-12-17 NOTE — Patient Instructions (Signed)
Will resume gabapentin at 300 mg at bedtime, prescription to [facility] now. Would try this instead of norco at bedtime now.

## 2013-12-17 NOTE — Telephone Encounter (Signed)
, °

## 2013-12-21 ENCOUNTER — Encounter: Payer: Self-pay | Admitting: Oncology

## 2013-12-24 ENCOUNTER — Ambulatory Visit: Payer: Medicare Other | Admitting: Radiation Oncology

## 2014-01-14 ENCOUNTER — Ambulatory Visit
Admission: RE | Admit: 2014-01-14 | Discharge: 2014-01-14 | Disposition: A | Discharge status: Home / Self Care | Payer: Medicare Other | Source: Ambulatory Visit | Attending: Radiation Oncology | Admitting: Radiation Oncology

## 2014-01-14 ENCOUNTER — Encounter: Payer: Self-pay | Admitting: Radiation Oncology

## 2014-01-14 VITALS — BP 144/90 | HR 85 | Temp 97.8°F | Ht 66.0 in | Wt 157.8 lb

## 2014-01-14 DIAGNOSIS — C541 Malignant neoplasm of endometrium: Secondary | ICD-10-CM

## 2014-01-14 NOTE — Progress Notes (Signed)
Radiation Oncology         [phone] ________________________________  Name: Misty Alvarado MRN: [id number]  Date: 01/14/2014  DOB: 1933-05-02  Follow-Up Visit Note  CC: Nyoka Cowden, MD  Marletta Lor, MD  Diagnosis:  Endometrial cancer   Primary site: Corpus Uteri - Carcinoma   Staging method: AJCC 7th Edition   Clinical: (T1a, N0)   Pathologic: (T1a, N0)   Summary: (T1a, N0) Interval Since Last Radiation:  2  months  Narrative:  The patient returns today for routine follow-up.  She seems to be doing better at this time. She is having less problems with diarrhea after being placed on 2 medications through the [facility]. She denies any hematuria vaginal bleeding or rectal bleeding. She has some mild discomfort in the pelvis region. She is also taking probiotics which seem to have helped her diarrhea.                             ALLERGIES:  is allergic to dexamethasone and rocephin.  Meds: Current Outpatient Prescriptions  Medication Sig Dispense Refill  . Cholecalciferol (VITAMIN D-3) 1000 UNITS CAPS Take 1 capsule by mouth daily.      Marland Kitchen gabapentin (NEURONTIN) 300 MG capsule Take 1 capsule (300 mg total) by mouth at bedtime.  30 capsule  3  . haloperidol (HALDOL) 1 MG tablet Take 1 tablet (1 mg total) by mouth 2 (two) times daily. 8:00 am and 8:00 pm  60 tablet  2  . HYDROcodone-acetaminophen (NORCO) 10-325 MG per tablet Take 1-2 tablets by mouth 2 (two) times daily as needed.  90 tablet  0  . hydrocortisone-pramoxine (PROCTOFOAM-HC) rectal foam Place 1 applicator rectally 3 (three) times daily. Use internally in the rectum.  10 g  1  . hyoscyamine (OSCIMIN SR) 0.375 MG 12 hr tablet TAKE 1 TABLET BY MOUTH THREE TIMES A DAY AS NEEDED CRAMPING/SPASMS  90 tablet  0  . lubiprostone (AMITIZA) 8 MCG capsule Take 1 capsule (8 mcg total) by mouth 2 (two) times daily with a meal.  60 capsule  0  . magnesium 30 MG tablet Take 30 mg by mouth 2 (two) times daily.      .  Probiotic Product (SOLUBLE FIBER/PROBIOTICS PO) Take by mouth.      . pyridoxine (B-6) 100 MG tablet Take 100 mg by mouth daily.      Marland Kitchen lidocaine (XYLOCAINE) 2 % solution Mix with desitin and apply to external hemorrhoids 2-3 times a day  100 mL  0  . LORazepam (ATIVAN) 0.5 MG tablet Take 1-2 tabs  under the tongue or swallow  Every 6 hrs. As needed for nausea.  Will make you drowsy  20 tablet  0  . mometasone (NASONEX) 50 MCG/ACT nasal spray Place 2 sprays into the nose daily as needed (for allergies).      . ondansetron (ZOFRAN) 8 MG tablet Take 1-2 tablets (8-16 mg total) by mouth every 12 (twelve) hours as needed for nausea (Will not make you drowsy).  20 tablet  2   No current facility-administered medications for this encounter.    Physical Findings: The patient is in no acute distress. Patient is alert and oriented.  height is 5\' 6"  (1.676 m) and weight is 157 lb 12.8 oz (71.578 kg). Her temperature is 97.8 F (36.6 C). Her blood pressure is 144/90 and her pulse is 85. .  The palpable supraclavicular or axillary adenopathy. The  lungs are clear to auscultation. The heart has regular rhythm and rate. The abdomen is soft and nontender with normal bowel sounds. Pelvic exam and Pap smear not performed in light of the patient's recent completion of treatment.  Lab Findings: Lab Results  Component Value Date   WBC 4.3 12/17/2013   HGB 11.0* 12/17/2013   HCT 32.0* 12/17/2013   MCV 94.8 12/17/2013   PLT 203 12/17/2013      Radiographic Findings: No results found.  Impression:  The patient is recovering from the effects of radiation.   Plan:  Routine followup in June. Patient will be seen in March by gynecologic oncology.  Today the patient was given a vaginal dilator and instructions on its use.  ____________________________________ Blair Promise, MD

## 2014-01-14 NOTE — Progress Notes (Signed)
Stevan Born here for follow up after HDR treatment.  She denies pain except for tingling in her legs and feet.  She is currently taking gabapentin for this.  She denies vaginal and rectal bleeding.  She denies any bladder issues.  She has occasional incontinence of stool which her daughter says she had before radiation.  She denies fatigue.

## 2014-01-28 ENCOUNTER — Other Ambulatory Visit: Payer: Self-pay | Admitting: Internal Medicine

## 2014-01-28 NOTE — Telephone Encounter (Signed)
Ok to refill JZ?

## 2014-01-30 NOTE — Telephone Encounter (Signed)
Left patient a detailed message to call and set up f/u appointment with Dr. Carlean Purl .  Sent in one refill , ok'ed by Alonza Bogus, PA-C.

## 2014-02-01 ENCOUNTER — Encounter: Payer: Self-pay | Admitting: Internal Medicine

## 2014-02-01 ENCOUNTER — Ambulatory Visit (INDEPENDENT_AMBULATORY_CARE_PROVIDER_SITE_OTHER): Payer: Medicare Other | Admitting: Internal Medicine

## 2014-02-01 VITALS — BP 160/100 | HR 106 | Temp 98.4°F | Resp 20 | Ht 66.0 in | Wt 156.0 lb

## 2014-02-01 DIAGNOSIS — I10 Essential (primary) hypertension: Secondary | ICD-10-CM

## 2014-02-01 DIAGNOSIS — R109 Unspecified abdominal pain: Secondary | ICD-10-CM

## 2014-02-01 DIAGNOSIS — N390 Urinary tract infection, site not specified: Secondary | ICD-10-CM

## 2014-02-01 DIAGNOSIS — R32 Unspecified urinary incontinence: Secondary | ICD-10-CM

## 2014-02-01 DIAGNOSIS — K5900 Constipation, unspecified: Secondary | ICD-10-CM

## 2014-02-01 LAB — CBC WITH DIFFERENTIAL/PLATELET
BASOS ABS: 0 10*3/uL (ref 0.0–0.1)
Basophils Relative: 0.8 % (ref 0.0–3.0)
EOS PCT: 3 % (ref 0.0–5.0)
Eosinophils Absolute: 0.1 10*3/uL (ref 0.0–0.7)
HCT: 33.2 % — ABNORMAL LOW (ref 36.0–46.0)
HEMOGLOBIN: 11.1 g/dL — AB (ref 12.0–15.0)
LYMPHS ABS: 1.4 10*3/uL (ref 0.7–4.0)
Lymphocytes Relative: 35.2 % (ref 12.0–46.0)
MCHC: 33.6 g/dL (ref 30.0–36.0)
MCV: 96 fl (ref 78.0–100.0)
MONOS PCT: 11.4 % (ref 3.0–12.0)
Monocytes Absolute: 0.5 10*3/uL (ref 0.1–1.0)
NEUTROS ABS: 2 10*3/uL (ref 1.4–7.7)
Neutrophils Relative %: 49.6 % (ref 43.0–77.0)
Platelets: 229 10*3/uL (ref 150.0–400.0)
RBC: 3.46 Mil/uL — ABNORMAL LOW (ref 3.87–5.11)
RDW: 13.7 % (ref 11.5–14.6)
WBC: 4 10*3/uL — ABNORMAL LOW (ref 4.5–10.5)

## 2014-02-01 LAB — COMPREHENSIVE METABOLIC PANEL
ALBUMIN: 4 g/dL (ref 3.5–5.2)
ALT: 19 U/L (ref 0–35)
AST: 22 U/L (ref 0–37)
Alkaline Phosphatase: 50 U/L (ref 39–117)
BUN: 15 mg/dL (ref 6–23)
CALCIUM: 9.4 mg/dL (ref 8.4–10.5)
CO2: 28 meq/L (ref 19–32)
Chloride: 103 mEq/L (ref 96–112)
Creatinine, Ser: 1 mg/dL (ref 0.4–1.2)
GFR: 55.65 mL/min — AB (ref >60.00)
GLUCOSE: 124 mg/dL — AB (ref 70–99)
POTASSIUM: 3.3 meq/L — AB (ref 3.5–5.1)
Sodium: 138 mEq/L (ref 135–145)
TOTAL PROTEIN: 7.1 g/dL (ref 6.0–8.3)
Total Bilirubin: 0.7 mg/dL (ref 0.3–1.2)

## 2014-02-01 LAB — POCT URINALYSIS DIPSTICK
Bilirubin, UA: NEGATIVE
Glucose, UA: NEGATIVE
Nitrite, UA: NEGATIVE
PROTEIN UA: 100
Spec Grav, UA: >=1.03
Urobilinogen, UA: 0.2
pH, UA: 6

## 2014-02-01 MED ORDER — NITROFURANTOIN MONOHYD MACRO 100 MG PO CAPS: 100.0000 mg | ORAL_CAPSULE | Freq: Two times a day (BID) | ORAL | Status: DC

## 2014-02-01 NOTE — Patient Instructions (Signed)
Drink as much fluid as you  can tolerate over the next few days  Take your antibiotic as prescribed until ALL of it is gone, but stop if you develop a rash, swelling, or any side effects of the medication.  Contact our office as soon as possible if  there are side effects of the medication.  Return in one month for follow-up

## 2014-02-01 NOTE — Progress Notes (Signed)
Subjective:    Patient ID: Misty Alvarado, female    DOB: [birth year], 80 y.o.   MRN: [id number]  HPI   80 year old patient who has a history of endometrial cancer.  She is status post chemotherapy in October of last year and has completed RT. in December of last year.  She has had some fecal and urinary incontinence issues and has been seen by GI.  For the past 4 days.  She has been on Cipro bid to lower abdominal pain, increasing incontinence.  Her appetite has fallen off a bit.  There's been no fever.  She also describes some low back pain.  Past Medical History  Diagnosis Date  . ALLERGIC RHINITIS 10/03/2007  . HYPERLIPIDEMIA 10/03/2007  . HYPERTENSION 10/03/2007  . Ulcer     gastric  . GERD (gastroesophageal reflux disease)     "ONCE IN A WHILE"  TUMS IF NEEDED  . Arthritis     KNEES  . Endometrial cancer     new diagnosis 03/02/13  . History of radiation therapy 10/22/13, 10/29/13, 11/05/13, 11/12/13, 11/19/13    proximal vagina 30 gray    History   Social History  . Marital Status: Widowed    Spouse Name: N/A    Number of Children: N/A  . Years of Education: N/A   Occupational History  . Not on file.   Social History Main Topics  . Smoking status: Never Smoker   . Smokeless tobacco: Never Used  . Alcohol Use: No  . Drug Use: No  . Sexual Activity: Not on file   Other Topics Concern  . Not on file   Social History Narrative  . No narrative on file    Past Surgical History  Procedure Laterality Date  . Cardiac catheterization    . Tubal ligation    . Lithotripsy      for kidney stone  . Partial thyroidectomy for a growth      YRS AGO   . Robotic assisted total hysterectomy with bilateral salpingo oopherectomy Bilateral 04/24/2013    Procedure: ROBOTIC ASSISTED TOTAL HYSTERECTOMY WITH BILATERAL SALPINGO OOPHORECTOMY,  LYMPH NODE DISSECTION;  Surgeon: Imagene Gurney A. Alycia Rossetti, MD;  Location: [facility];  Service: Gynecology;  Laterality: Bilateral;  . Lymph node  dissection N/A 04/24/2013    Procedure: LYMPH NODE DISSECTION;  Surgeon: Imagene Gurney A. Alycia Rossetti, MD;  Location: [facility];  Service: Gynecology;  Laterality: N/A;  . Abdominal hysterectomy    . Back surgery  1968    Ruptured disc repair    Family History  Problem Relation Age of Onset  . Arthritis Mother   . Colon cancer Neg Hx   . Cancer Sister     1 deceased from lung cancer  . Cancer Brother     2 deceased from lung cancer    Allergies  Allergen Reactions  . Dexamethasone Other (See Comments)    Mood disturbance, psychosis  . Rocephin [Ceftriaxone Sodium In Dextrose] Rash    Current Outpatient Prescriptions on File Prior to Visit  Medication Sig Dispense Refill  . Cholecalciferol (VITAMIN D-3) 1000 UNITS CAPS Take 1 capsule by mouth daily.      Marland Kitchen gabapentin (NEURONTIN) 300 MG capsule Take 1 capsule (300 mg total) by mouth at bedtime.  30 capsule  3  . haloperidol (HALDOL) 1 MG tablet Take 1 tablet (1 mg total) by mouth 2 (two) times daily. 8:00 am and 8:00 pm  60 tablet  2  . HYDROcodone-acetaminophen (NORCO) 10-325 MG  per tablet Take 1-2 tablets by mouth 2 (two) times daily as needed.  90 tablet  0  . hydrocortisone-pramoxine (PROCTOFOAM-HC) rectal foam Place 1 applicator rectally 3 (three) times daily. Use internally in the rectum.  10 g  1  . hyoscyamine (OSCIMIN SR) 0.375 MG 12 hr tablet TAKE 1 TABLET BY MOUTH THREE TIMES A DAY AS NEEDED CRAMPING/SPASMS  90 tablet  0  . lidocaine (XYLOCAINE) 2 % solution Mix with desitin and apply to external hemorrhoids 2-3 times a day  100 mL  0  . lubiprostone (AMITIZA) 8 MCG capsule Take 1 capsule (8 mcg total) by mouth 2 (two) times daily with a meal.  60 capsule  0  . magnesium 30 MG tablet Take 30 mg by mouth 2 (two) times daily.      . Probiotic Product (SOLUBLE FIBER/PROBIOTICS PO) Take by mouth.      . pyridoxine (B-6) 100 MG tablet Take 100 mg by mouth daily.      . mometasone (NASONEX) 50 MCG/ACT nasal spray Place 2 sprays into the nose  daily as needed (for allergies).      . ondansetron (ZOFRAN) 8 MG tablet Take 1-2 tablets (8-16 mg total) by mouth every 12 (twelve) hours as needed for nausea (Will not make you drowsy).  20 tablet  2  . OSCIMIN SR 0.375 MG 12 hr tablet TAKE 1 TABLET BY MOUTH 3 TIMES DAILY AS NEEDED FOR CRAMPING/SPASMS  90 tablet  0   No current facility-administered medications on file prior to visit.    BP 160/100  Pulse 106  Temp(Src) 98.4 F (36.9 C) (Oral)  Resp 20  Ht 5\' 6"  (1.676 m)  Wt 156 lb (70.761 kg)  BMI 25.19 kg/m2  SpO2 97%       Review of Systems  Constitutional: Positive for activity change and appetite change.  HENT: Negative for congestion, dental problem, hearing loss, rhinorrhea, sinus pressure, sore throat and tinnitus.   Eyes: Negative for pain, discharge and visual disturbance.  Respiratory: Negative for cough and shortness of breath.   Cardiovascular: Negative for chest pain, palpitations and leg swelling.  Gastrointestinal: Positive for abdominal pain. Negative for nausea, vomiting, diarrhea, constipation, blood in stool and abdominal distention.  Genitourinary: Negative for dysuria, urgency, frequency, hematuria, flank pain, vaginal bleeding, vaginal discharge, difficulty urinating, vaginal pain and pelvic pain.  Musculoskeletal: Negative for arthralgias, gait problem and joint swelling.  Skin: Negative for rash.  Neurological: Negative for dizziness, syncope, speech difficulty, weakness, numbness and headaches.  Hematological: Negative for adenopathy.  Psychiatric/Behavioral: Negative for behavioral problems, dysphoric mood and agitation. The patient is not nervous/anxious.        Objective:   Physical Exam  Constitutional: She is oriented to person, place, and time. She appears well-developed and well-nourished. No distress.  Asthenic but in no acute distress. Afebrile.  Repeat blood pressure 140/90   HENT:  Head: Normocephalic.  Right Ear: External ear  normal.  Left Ear: External ear normal.  Mucosal membranes slightly dry  Eyes: Conjunctivae and EOM are normal. Pupils are equal, round, and reactive to light.  Neck: Normal range of motion. Neck supple. No thyromegaly present.  Cardiovascular: Normal rate, regular rhythm, normal heart sounds and intact distal pulses.   Pulmonary/Chest: Effort normal and breath sounds normal.  Abdominal: Soft. Bowel sounds are normal. She exhibits no distension and no mass. There is no tenderness. There is no rebound and no guarding.  Musculoskeletal: Normal range of motion.  Lymphadenopathy:  She has no cervical adenopathy.  Neurological: She is alert and oriented to person, place, and time.  Skin: Skin is warm and dry. No rash noted.  Psychiatric: She has a normal mood and affect. Her behavior is normal.          Assessment & Plan:   History of abdominal pain, increasing urinary incontinence.  UA revealed trace hematuria and pyuria.  We'll attempt to obtain a urine specimen for culture.  Will switch to Macrodantin for 7 days.  Check CBC and chemistries History of hypertension.  Blood pressure remains high, normal we'll continue to observe. History of endometrial cancer, status post chemotherapy and radiotherapy  Mild hematuria, pyuria.  Possible UTI versus mild radiation cystitis

## 2014-02-01 NOTE — Progress Notes (Signed)
Pre-visit discussion using our clinic review tool. No additional management support is needed unless otherwise documented below in the visit note.  

## 2014-02-04 ENCOUNTER — Telehealth: Payer: Self-pay | Admitting: Internal Medicine

## 2014-02-04 ENCOUNTER — Encounter: Payer: Self-pay | Admitting: Internal Medicine

## 2014-02-04 NOTE — Telephone Encounter (Signed)
Relevant patient education assigned to patient using Emmi. ° °

## 2014-02-06 ENCOUNTER — Telehealth: Payer: Self-pay | Admitting: *Deleted

## 2014-02-06 NOTE — Telephone Encounter (Signed)
Call from pt daughter Bonnita Nasuti regarding pt appt. Appt for 2/27 r/s due to weather for 3/23 11:45am.

## 2014-02-08 ENCOUNTER — Ambulatory Visit: Payer: Self-pay | Admitting: Gynecology

## 2014-02-12 ENCOUNTER — Telehealth: Payer: Self-pay | Admitting: Internal Medicine

## 2014-02-12 DIAGNOSIS — C541 Malignant neoplasm of endometrium: Secondary | ICD-10-CM

## 2014-02-12 NOTE — Telephone Encounter (Signed)
Pt request refill HYDROcodone-acetaminophen (NORCO) 10-325 MG per tablet Daughter would like you to know she has had to give pt 3 /day of the haloperidol (HALDOL) 1 MG tablet  .  rx is for only 2/day

## 2014-02-13 MED ORDER — HYDROCODONE-ACETAMINOPHEN 10-325 MG PO TABS: 1.0000 | ORAL_TABLET | Freq: Two times a day (BID) | ORAL | Status: DC | PRN

## 2014-02-13 NOTE — Telephone Encounter (Signed)
Spoke to pt's daughter Bonnita Nasuti, told her Rx ready for pickup, and regarding Haldol Rx I can not change Rx will have to check with Dr. Raliegh Ip when he returns. Bonnita Nasuti said that is fine, she still has a refill left. Told her okay. Bonnita Nasuti said will come by this afternoon for Rx. Told her that is fine will be at the front desk. Rx printed and signed.

## 2014-03-04 ENCOUNTER — Ambulatory Visit: Payer: Medicare Other | Attending: Gynecology | Admitting: Gynecology

## 2014-03-04 ENCOUNTER — Other Ambulatory Visit (HOSPITAL_COMMUNITY)
Admission: RE | Admit: 2014-03-04 | Discharge: 2014-03-04 | Disposition: A | Discharge status: Home / Self Care | Payer: Medicare Other | Source: Ambulatory Visit | Attending: Gynecology | Admitting: Gynecology

## 2014-03-04 ENCOUNTER — Encounter: Payer: Self-pay | Admitting: Gynecology

## 2014-03-04 ENCOUNTER — Telehealth: Payer: Self-pay | Admitting: *Deleted

## 2014-03-04 VITALS — BP 144/83 | HR 86 | Temp 98.4°F | Resp 20 | Ht 66.0 in | Wt 154.6 lb

## 2014-03-04 DIAGNOSIS — Z90710 Acquired absence of both cervix and uterus: Secondary | ICD-10-CM | POA: Insufficient documentation

## 2014-03-04 DIAGNOSIS — C541 Malignant neoplasm of endometrium: Secondary | ICD-10-CM

## 2014-03-04 DIAGNOSIS — G5790 Unspecified mononeuropathy of unspecified lower limb: Secondary | ICD-10-CM | POA: Insufficient documentation

## 2014-03-04 DIAGNOSIS — Z79899 Other long term (current) drug therapy: Secondary | ICD-10-CM | POA: Insufficient documentation

## 2014-03-04 DIAGNOSIS — Z888 Allergy status to other drugs, medicaments and biological substances status: Secondary | ICD-10-CM | POA: Insufficient documentation

## 2014-03-04 DIAGNOSIS — C549 Malignant neoplasm of corpus uteri, unspecified: Secondary | ICD-10-CM | POA: Insufficient documentation

## 2014-03-04 DIAGNOSIS — Z124 Encounter for screening for malignant neoplasm of cervix: Secondary | ICD-10-CM | POA: Insufficient documentation

## 2014-03-04 DIAGNOSIS — Z881 Allergy status to other antibiotic agents status: Secondary | ICD-10-CM | POA: Insufficient documentation

## 2014-03-04 DIAGNOSIS — R159 Full incontinence of feces: Secondary | ICD-10-CM | POA: Insufficient documentation

## 2014-03-04 DIAGNOSIS — Z9221 Personal history of antineoplastic chemotherapy: Secondary | ICD-10-CM | POA: Insufficient documentation

## 2014-03-04 NOTE — Telephone Encounter (Signed)
MD progress note faxed to [facility] for a refferal to [facility] Dr. Jonita Albee for fecal incont. They will in turn fax a general referral to [facility] for pt.

## 2014-03-04 NOTE — Patient Instructions (Signed)
Follow up with in 6 months. Please call our office for an appt in July

## 2014-03-04 NOTE — Progress Notes (Signed)
Consult Note: Gyn-Onc   Misty Alvarado 80 y.o. female  Chief Complaint  Patient presents with  . 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 Fecal incontinence.  Peripheral neuropathy in her feet (stable).  Plan: Pap smears are obtained. Patient is referred to colorectal surgery for further evaluation of her fecal incontinence..She has appointments with Drs. Kinard and Livesay in the months ahead. Return to see me in 6 months.  Interval history. The patient returns today for continuing followup. She comes accompanied by her niece. Her primary concern main complaint is that of fecal incontinence which she has had for about a year. She be evaluated by a gastroenterologist with no particular treatment plan or improvement. Patient admits to having to change close to 6 times a day.  She also has peripheral neuropathy in her feet being treated with vitamin B 6 and Neurontin.. She denies any nausea vomiting and has no other GI or GU symptoms.  HPI: Patient initially presented with postmenopausal bleeding. A biopsy showed endometrial cancer and she underwent a robotic assisted hysterectomy, BSO, and pelvic lymphadenectomy on Apr 24 2013. Final pathology showed a stage I a papillary serous endometrial carcinoma with lymph vascular invasion.  Adjuvant chemotherapy with carboplatin and Taxol as well as vaginal vault brachytherapy was completed. Following chemotherapy she has some peripheral neuropathy in her feet.  Review of Systems:10 point review of systems is negative except as noted in interval history.   Vitals: Blood pressure 144/83, pulse 86, temperature 98.4 F (36.9 C), resp. rate 20, height 5\' 6"  (1.676 m), weight 154 lb 9.6 oz (70.126 kg).  Physical Exam: General : The patient is a healthy woman in no acute distress. She has alopecia. HEENT: normocephalic, extraoccular movements normal;  neck is supple without thyromegally  Lynphnodes: Supraclavicular and inguinal nodes not enlarged  Abdomen: Soft, non-tender, no ascites, no organomegally, no masses, no hernias all incisions are healing well Pelvic:  EGBUS vagina bladder urethra are normal  Cervix and uterus are surgically absent  Bimanual rectovaginal exam revealed no masses induration or nodularity.   Lower extremities: No edema or varicosities. Normal range of motion      Allergies  Allergen Reactions  . Dexamethasone Other (See Comments)    Mood disturbance, psychosis  . Rocephin [Ceftriaxone Sodium In Dextrose] Rash    Past Medical History  Diagnosis Date  . ALLERGIC RHINITIS 10/03/2007  . HYPERLIPIDEMIA 10/03/2007  . HYPERTENSION 10/03/2007  . Ulcer     gastric  . GERD (gastroesophageal reflux disease)     "ONCE IN A WHILE"  TUMS IF NEEDED  . Arthritis     KNEES  . Endometrial cancer     new diagnosis 03/02/13  . History of radiation therapy 10/22/13, 10/29/13, 11/05/13, 11/12/13, 11/19/13    proximal vagina 30 gray    Past Surgical History  Procedure Laterality Date  . Cardiac catheterization    . Tubal ligation    . Lithotripsy      for kidney stone  . Partial thyroidectomy for a growth      YRS AGO   . Robotic assisted total hysterectomy with bilateral salpingo oopherectomy Bilateral 04/24/2013    Procedure: ROBOTIC ASSISTED TOTAL HYSTERECTOMY WITH BILATERAL SALPINGO OOPHORECTOMY,  LYMPH NODE DISSECTION;  Surgeon: Imagene Gurney A. Alycia Rossetti, MD;  Location: [facility];  Service: Gynecology;  Laterality: Bilateral;  . Lymph node dissection N/A 04/24/2013    Procedure: LYMPH  NODE DISSECTION;  Surgeon: Lucita Lora. Alycia Rossetti, MD;  Location: [facility];  Service: Gynecology;  Laterality: N/A;  . Abdominal hysterectomy    . Back surgery  1968    Ruptured disc repair    Current Outpatient Prescriptions  Medication Sig Dispense Refill  . Cholecalciferol (VITAMIN D-3) 1000 UNITS CAPS Take 1 capsule by mouth daily.      Marland Kitchen  gabapentin (NEURONTIN) 300 MG capsule Take 1 capsule (300 mg total) by mouth at bedtime.  30 capsule  3  . haloperidol (HALDOL) 1 MG tablet Take 1 tablet (1 mg total) by mouth 2 (two) times daily. 8:00 am and 8:00 pm  60 tablet  2  . HYDROcodone-acetaminophen (NORCO) 10-325 MG per tablet Take 1-2 tablets by mouth 2 (two) times daily as needed.  90 tablet  0  . hyoscyamine (OSCIMIN SR) 0.375 MG 12 hr tablet TAKE 1 TABLET BY MOUTH THREE TIMES A DAY AS NEEDED CRAMPING/SPASMS  90 tablet  0  . magnesium 30 MG tablet Take 30 mg by mouth 2 (two) times daily.      . Probiotic Product (SOLUBLE FIBER/PROBIOTICS PO) Take by mouth.      . pyridoxine (B-6) 100 MG tablet Take 100 mg by mouth daily.      . hydrocortisone-pramoxine (PROCTOFOAM-HC) rectal foam Place 1 applicator rectally 3 (three) times daily. Use internally in the rectum.  10 g  1  . lidocaine (XYLOCAINE) 2 % solution Mix with desitin and apply to external hemorrhoids 2-3 times a day  100 mL  0  . lubiprostone (AMITIZA) 8 MCG capsule Take 1 capsule (8 mcg total) by mouth 2 (two) times daily with a meal.  60 capsule  0  . mometasone (NASONEX) 50 MCG/ACT nasal spray Place 2 sprays into the nose daily as needed (for allergies).      . nitrofurantoin, macrocrystal-monohydrate, (MACROBID) 100 MG capsule Take 1 capsule (100 mg total) by mouth 2 (two) times daily.  14 capsule  0  . ondansetron (ZOFRAN) 8 MG tablet Take 1-2 tablets (8-16 mg total) by mouth every 12 (twelve) hours as needed for nausea (Will not make you drowsy).  20 tablet  2  . OSCIMIN SR 0.375 MG 12 hr tablet TAKE 1 TABLET BY MOUTH 3 TIMES DAILY AS NEEDED FOR CRAMPING/SPASMS  90 tablet  0   No current facility-administered medications for this visit.    History   Social History  . Marital Status: Widowed    Spouse Name: N/A    Number of Children: N/A  . Years of Education: N/A   Occupational History  . Not on file.   Social History Main Topics  . Smoking status: Never Smoker    . Smokeless tobacco: Never Used  . Alcohol Use: No  . Drug Use: No  . Sexual Activity: Not on file   Other Topics Concern  . Not on file   Social History Narrative  . No narrative on file    Family History  Problem Relation Age of Onset  . Arthritis Mother   . Colon cancer Neg Hx   . Cancer Sister     1 deceased from lung cancer  . Cancer Brother     2 deceased from lung cancer      CLARKE-PEARSON,Yanina Knupp L, MD 03/04/2014, 12:02 PM

## 2014-03-08 ENCOUNTER — Telehealth: Payer: Self-pay | Admitting: *Deleted

## 2014-03-08 NOTE — Telephone Encounter (Signed)
Called pt notified no sign of cancer on pap smear.

## 2014-03-08 NOTE — Telephone Encounter (Signed)
Message copied by Lucile Crater on Fri Mar 08, 2014  5:31 PM ------      Message from: Joylene John D      Created: Fri Mar 08, 2014  5:13 PM       Please let her know there were no signs of cancer with her pap smear            ----- Message -----         From: [facility]         Sent: 03/08/2014   3:49 PM           To: Dorothyann Gibbs, NP                   ------

## 2014-03-17 ENCOUNTER — Other Ambulatory Visit: Payer: Self-pay | Admitting: Oncology

## 2014-03-18 ENCOUNTER — Telehealth: Payer: Self-pay | Admitting: Oncology

## 2014-03-18 ENCOUNTER — Ambulatory Visit: Payer: Self-pay | Admitting: Oncology

## 2014-03-18 ENCOUNTER — Other Ambulatory Visit: Payer: Self-pay

## 2014-03-18 NOTE — Telephone Encounter (Signed)
, °

## 2014-03-19 ENCOUNTER — Telehealth: Payer: Self-pay | Admitting: Internal Medicine

## 2014-03-19 DIAGNOSIS — C541 Malignant neoplasm of endometrium: Secondary | ICD-10-CM

## 2014-03-19 NOTE — Telephone Encounter (Signed)
Pt new rx hydrocodone °

## 2014-03-20 ENCOUNTER — Encounter: Payer: Self-pay | Admitting: Gynecology

## 2014-03-20 MED ORDER — HYDROCODONE-ACETAMINOPHEN 10-325 MG PO TABS: 1.0000 | ORAL_TABLET | Freq: Two times a day (BID) | ORAL | Status: DC | PRN

## 2014-03-20 NOTE — Telephone Encounter (Signed)
[name] notified Rx ready for pickup, will be at the front desk. Rx printed and signed.

## 2014-03-21 ENCOUNTER — Other Ambulatory Visit: Payer: Self-pay | Admitting: Gastroenterology

## 2014-03-25 ENCOUNTER — Telehealth: Payer: Self-pay | Admitting: Oncology

## 2014-03-25 ENCOUNTER — Encounter: Payer: Self-pay | Admitting: Oncology

## 2014-03-25 ENCOUNTER — Other Ambulatory Visit (HOSPITAL_BASED_OUTPATIENT_CLINIC_OR_DEPARTMENT_OTHER): Payer: Medicare Other

## 2014-03-25 ENCOUNTER — Ambulatory Visit (HOSPITAL_BASED_OUTPATIENT_CLINIC_OR_DEPARTMENT_OTHER): Payer: Medicare Other | Admitting: Oncology

## 2014-03-25 VITALS — BP 147/89 | HR 94 | Temp 98.9°F | Resp 18 | Ht 66.0 in | Wt 143.3 lb

## 2014-03-25 DIAGNOSIS — Z8542 Personal history of malignant neoplasm of other parts of uterus: Secondary | ICD-10-CM

## 2014-03-25 DIAGNOSIS — R159 Full incontinence of feces: Secondary | ICD-10-CM

## 2014-03-25 DIAGNOSIS — F22 Delusional disorders: Secondary | ICD-10-CM

## 2014-03-25 DIAGNOSIS — C541 Malignant neoplasm of endometrium: Secondary | ICD-10-CM

## 2014-03-25 DIAGNOSIS — IMO0002 Reserved for concepts with insufficient information to code with codable children: Secondary | ICD-10-CM

## 2014-03-25 LAB — CBC WITH DIFFERENTIAL/PLATELET
BASO%: 0.7 % (ref 0.0–2.0)
Basophils Absolute: 0 10*3/uL (ref 0.0–0.1)
EOS%: 5.1 % (ref 0.0–7.0)
Eosinophils Absolute: 0.2 10*3/uL (ref 0.0–0.5)
HEMATOCRIT: 35.4 % (ref 34.8–46.6)
HGB: 12.3 g/dL (ref 11.6–15.9)
LYMPH%: 34.2 % (ref 14.0–49.7)
MCH: 32.2 pg (ref 25.1–34.0)
MCHC: 34.7 g/dL (ref 31.5–36.0)
MCV: 93 fL (ref 79.5–101.0)
MONO#: 0.4 10*3/uL (ref 0.1–0.9)
MONO%: 10.1 % (ref 0.0–14.0)
NEUT#: 1.9 10*3/uL (ref 1.5–6.5)
NEUT%: 49.9 % (ref 38.4–76.8)
PLATELETS: 238 10*3/uL (ref 145–400)
RBC: 3.81 10*6/uL (ref 3.70–5.45)
RDW: 12.6 % (ref 11.2–14.5)
WBC: 3.9 10*3/uL (ref 3.9–10.3)
lymph#: 1.3 10*3/uL (ref 0.9–3.3)

## 2014-03-25 LAB — COMPREHENSIVE METABOLIC PANEL (CC13)
ALT: 8 U/L (ref 0–55)
ANION GAP: 10 meq/L (ref 3–11)
AST: 19 U/L (ref 5–34)
Albumin: 4.1 g/dL (ref 3.5–5.0)
Alkaline Phosphatase: 60 U/L (ref 40–150)
BILIRUBIN TOTAL: 0.56 mg/dL (ref 0.20–1.20)
BUN: 18.6 mg/dL (ref 7.0–26.0)
CO2: 27 mEq/L (ref 22–29)
CREATININE: 0.9 mg/dL (ref 0.6–1.1)
Calcium: 9.8 mg/dL (ref 8.4–10.4)
Chloride: 107 mEq/L (ref 98–109)
Glucose: 112 mg/dl (ref 70–140)
Potassium: 3.7 mEq/L (ref 3.5–5.1)
Sodium: 144 mEq/L (ref 136–145)
Total Protein: 7.1 g/dL (ref 6.4–8.3)

## 2014-03-25 NOTE — Telephone Encounter (Signed)
per pof to sch w/LL mid July-LL sch not made out through that mth.Printed pof and gave to Lelon Frohlich to hold until LL sch is created

## 2014-03-25 NOTE — Progress Notes (Signed)
OFFICE PROGRESS NOTE   03/25/2014   Physicians:D.ClarkePearson, J.Kinard, P. KWIATKOWSKI, Megan Morris , C.Venita Sheffield   INTERVAL HISTORY:  Patient is seen, together with daughter, in scheduled follow up of IA papillary serous endometrial carcinoma, now on observation thru this office/ gyn oncology/ radiation oncology since completing chemotherapy 09-06-13 and completing radiation therapy 11-19-13. Last CT AP was 07-14-2013; she saw Dr Josephina Shih in 02-2014 and is to see Dr Sondra Come in June.  History is from daughter, tho patient will answer some questions. Daughter reports that patient was doing well with good appetite and much improved energy until February, when she abruptly stopped participating in much of her own care and po intake decreased. Daughter tells me privately that this change occurred immediately after patient overheard conversation about daughter's upcoming orthopedic surgery, which is now on hold because of this situation. Patient denies nausea, vomiting, GERD, pain. The fecal incontinence is significant ongoing problem, often requiring changing clothes and bed several times daily. Dr Josephina Shih had suggested evaluation by Dr Leighton Ruff, apparently not set up yet. She drinks milk well, and still eats daily. Daughter decreased haldol to once daily with no improvement, tried stopping this which caused increased agitation, now back on bid. She did stop gabapentin, which does not seem to have worsened neuropathy symptoms.  Daughter has enrolled her in senior wellness program in [place], directed by Dr Gerrit Heck. This program will begin May 1. Dr Rocky Link has seen patient once and will follow daily with that program. She has not seen neurologist.   Peripheral neuropathy symptoms in feet and legs from chemo seem less bothersome.  She does not have PAC.   ONCOLOGIC HISTORY Patient had several weeks of vaginal spotting in early 2014, without other symptoms.  She had endometrial biopsy 03-02-2012 [phone]) with grade 1 endometroid adenocarcinoma. She was referred to Dr Josephina Shih, his exam not remarkable otherwise. She had colonoscopy by Dr Carlean Purl on 04-23-13, with a diminutive adenoma and otherwise benign. CXR preoperatively 04-20-2013 with chronic bronchitic and interstitial changes and degenerative changes of spine, otherwise not remarkable. She was taken to total robotic hysterectomy with BSO and bilateral pelvic node evaluation by Dr Alycia Rossetti on 04-24-2013. Post operative course was uncomplicated and she was discharged home on POD 1. Pathology [phone]) found high grade papillary serous carcinoma tumor size 3.5 cm with myometrial invasion 0.5 cm where myometrium was 1.5 cm, with focal angiolymphatic invasion, 6 right pelvic and 1 left pelvic node negative, for T1aN0 staging. Recommendation was for 6 cycles of adjuvant taxol/ carboplatin followed by vaginal vault brachytherapy. Cycle 1 taxol carbo was 8-58-85, complicated by apparent steroid psychosis, severe aching and neutropenia; she remained on haldol scheduled dosing for remainder of chemotherapy. Cycle 2 was carboplatin only on 06-20-13; she was hospitalized 7-11 and 06-23-13 with dehydration and decreased blood pressure. Cycle 3 taxol at 135 mg/m2 + carboplatin was given on 07-11-13, with hospitalization 8-1 thru 07-17-13 for new atrial fibrillation with RVR. Cycle 4 delayed x 1 week for counts, given 08-06-13 with taxol at 135 mg/m2 and carbo dose reduced. Cycle 5 was given 09-06-13 after further delay for counts, neutropenic despite neupogen that cycle. Peripheral neuropathy was significantly worse after cycle 5, and overall performance status poor to the point that cycle 6 was cancelled.  She had CT AP 07-14-13 during hospitalization, without imaging evidence of malignancy.    Review of systems as above, also: No cough or SOB. No fever or obvious symptoms of infection. Does sleep. Walks on one  level,  sometimes uses walker. Remainder of 10 point Review of Systems negative.  Objective:  Vital signs in last 24 hours:  BP 147/89  Pulse 94  Temp(Src) 98.9 F (37.2 C) (Oral)  Resp 18  Ht 5\' 6"  (1.676 m)  Wt 143 lb 4.8 oz (65 kg)  BMI 23.14 kg/m2 Weight is down 11 lbs. Awake, responds slowly, very little facial expression, continuously wiping mouth. Ambulatory slowly.  HEENT:PERRL, sclerae not icteric. Oral mucosa moist without lesions, posterior pharynx clear.   No JVD.  Lymphatics:no cervical,suraclavicular adenopathy Resp: clear to auscultation bilaterally  Cardio: regular rate and rhythm. No gallop. GI: soft, nontender, not distended, no apparent mass or organomegaly. Normally active bowel sounds.  Musculoskeletal/ Extremities: without pitting edema, cords, tenderness Neuro: peripheral neuropathy feet. No tremor. Moves all extremities. Flat affect. Skin without rash, ecchymosis, petechiae   Lab Results:  Results for orders placed in visit on 03/25/14  CBC WITH DIFFERENTIAL      Result Value Ref Range   WBC 3.9  3.9 - 10.3 10e3/uL   NEUT# 1.9  1.5 - 6.5 10e3/uL   HGB 12.3  11.6 - 15.9 g/dL   HCT 35.4  34.8 - 46.6 %   Platelets 238  145 - 400 10e3/uL   MCV 93.0  79.5 - 101.0 fL   MCH 32.2  25.1 - 34.0 pg   MCHC 34.7  31.5 - 36.0 g/dL   RBC 3.81  3.70 - 5.45 10e6/uL   RDW 12.6  11.2 - 14.5 %   lymph# 1.3  0.9 - 3.3 10e3/uL   MONO# 0.4  0.1 - 0.9 10e3/uL   Eosinophils Absolute 0.2  0.0 - 0.5 10e3/uL   Basophils Absolute 0.0  0.0 - 0.1 10e3/uL   NEUT% 49.9  38.4 - 76.8 %   LYMPH% 34.2  14.0 - 49.7 %   MONO% 10.1  0.0 - 14.0 %   EOS% 5.1  0.0 - 7.0 %   BASO% 0.7  0.0 - 2.0 %  COMPREHENSIVE METABOLIC PANEL (VP71)      Result Value Ref Range   Sodium 144  136 - 145 mEq/L   Potassium 3.7  3.5 - 5.1 mEq/L   Chloride 107  98 - 109 mEq/L   CO2 27  22 - 29 mEq/L   Glucose 112  70 - 140 mg/dl   BUN 18.6  7.0 - 26.0 mg/dL   Creatinine 0.9  0.6 - 1.1 mg/dL   Total  Bilirubin 0.56  0.20 - 1.20 mg/dL   Alkaline Phosphatase 60  40 - 150 U/L   AST 19  5 - 34 U/L   ALT 8  0 - 55 U/L   Total Protein 7.1  6.4 - 8.3 g/dL   Albumin 4.1  3.5 - 5.0 g/dL   Calcium 9.8  8.4 - 10.4 mg/dL   Anion Gap 10  3 - 11 mEq/L     Studies/Results:  No results found.  Medications: I have reviewed the patient's current medications. I have suggested decreasing the haldol to once daily if this is sufficient to control agitation.Stay off gabapentin. No prescriptions requested. Daughter feels patient compliant with all prescribed meds and not using any others. After visit I see Amitiza on med list - will confirm with daughter and have her try holding this if so.  DISCUSSION: weight loss concerning, and abrupt change in activity level and appetite do suggest emotional/ psych component, but I wonder about Parkinson's or other. I have encouraged patient  to try the senior wellness center, and hopefully Dr Rocky Link will be able to evaluate further. Discussed trying [employer] as she drinks milk well.  Assessment/Plan:  1. T1aN0 grade 3 papillary serous endometrial carcinoma with + angiolymphatic invasion: post treatment as above, no symptoms that I can tell suggesting recurrent disease. She will see Dr Sondra Come in June and I will see her in ~ 3 months. 2.extreme agitation and paranoia after cycle 1 chemo, likely steroid exacerbation of previous tendencies. Bid haldol by PCP Dr Nonie Hoyer helpful then, discussion as above. Still living with daughter. 3.Fecal incontinence ongoing. Dr Josephina Shih had suggested referral to surgery, not done yet. Will ask daughter to hold amitiza if still using this. 4.hx HTN  5.degenerative arthritis knees stable  6.allergic rhinitis not bothersome now  7.unremarkable colonoscopy and mammograms around time of cancer diagnosis  8. Anemia and leukopenia related to chemo RT, resolved. 9.paroxysmal a fib with RVR: spontaneously resolved,  regular rate now   Cc this note Dr Gerrit Heck  Time spent 25 min including >50% counseling and coordination of care.    Gordy Levan, MD   03/25/2014, 11:29 AM

## 2014-03-27 ENCOUNTER — Telehealth: Payer: Self-pay | Admitting: *Deleted

## 2014-03-27 NOTE — Telephone Encounter (Signed)
Labs called to daughter. Pt is not taking amitiza or gabapentin. Has cut haldol to 1 tablet per day and is cutting back on vicodin. She is also encouraging patient to eat.

## 2014-03-27 NOTE — Telephone Encounter (Signed)
Message copied by Patton Salles on Wed Mar 27, 2014 10:29 AM ------      Message from: Gordy Levan      Created: Tue Mar 26, 2014 11:46 AM       Labs seen and need follow up: please let daughter know chemistries all normal ------

## 2014-04-02 ENCOUNTER — Telehealth: Payer: Self-pay | Admitting: *Deleted

## 2014-04-02 DIAGNOSIS — C541 Malignant neoplasm of endometrium: Secondary | ICD-10-CM

## 2014-04-02 NOTE — Telephone Encounter (Signed)
Daughter called to say she saw the recall of all [employer] ice cream products due to listeria. Wanted to let Dr Marko Plume know that patient eats at least 1 cup of [employer] every day. Dr Marko Plume notified, feels it would be best to go ahead and test stool for listeria. Daughter will bring in stool specimen to [facility] as it is closer than PCP

## 2014-04-03 ENCOUNTER — Other Ambulatory Visit: Payer: Self-pay

## 2014-04-03 ENCOUNTER — Ambulatory Visit (HOSPITAL_BASED_OUTPATIENT_CLINIC_OR_DEPARTMENT_OTHER): Payer: Medicare Other

## 2014-04-03 DIAGNOSIS — A058 Other specified bacterial foodborne intoxications: Secondary | ICD-10-CM

## 2014-04-03 DIAGNOSIS — C541 Malignant neoplasm of endometrium: Secondary | ICD-10-CM

## 2014-04-03 DIAGNOSIS — I10 Essential (primary) hypertension: Secondary | ICD-10-CM

## 2014-04-03 NOTE — Progress Notes (Signed)
Orders place for stools and blood test for listeria as pt eates [employer] Ice ream and there was a recall.

## 2014-04-07 LAB — STOOL CULTURE

## 2014-04-08 ENCOUNTER — Telehealth: Payer: Self-pay

## 2014-04-08 NOTE — Telephone Encounter (Signed)
Message copied by Baruch Merl on Mon Apr 08, 2014  5:58 PM ------      Message from: Evlyn Clines P      Created: Mon Apr 08, 2014 10:31 AM       Let daughter know stool culture negative for infection ------

## 2014-04-08 NOTE — Telephone Encounter (Signed)
Spoke with daughter Bonnita Nasuti and an reviewed  The results of the stool culture as noted below by Dr. Marko Plume. GI referral is being worked on by PCP Dr. Burnice Logan.

## 2014-05-23 ENCOUNTER — Telehealth: Payer: Self-pay | Admitting: *Deleted

## 2014-05-23 ENCOUNTER — Ambulatory Visit: Admission: RE | Admit: 2014-05-23 | Payer: Medicare Other | Source: Ambulatory Visit | Admitting: Radiation Oncology

## 2014-05-23 NOTE — Telephone Encounter (Signed)
Patient was a no show today.  Left messages on patient's phone and her daughter's to call me to reschedule

## 2014-07-11 ENCOUNTER — Encounter: Payer: Self-pay | Admitting: Gynecology

## 2014-08-05 ENCOUNTER — Other Ambulatory Visit (HOSPITAL_COMMUNITY)
Admission: RE | Admit: 2014-08-05 | Discharge: 2014-08-05 | Disposition: A | Discharge status: Home / Self Care | Payer: Medicare Other | Source: Ambulatory Visit | Attending: Radiation Oncology | Admitting: Radiation Oncology

## 2014-08-05 ENCOUNTER — Ambulatory Visit
Admission: RE | Admit: 2014-08-05 | Discharge: 2014-08-05 | Disposition: A | Discharge status: Home / Self Care | Payer: Medicare Other | Source: Ambulatory Visit | Attending: Radiation Oncology | Admitting: Radiation Oncology

## 2014-08-05 ENCOUNTER — Encounter: Payer: Self-pay | Admitting: Radiation Oncology

## 2014-08-05 VITALS — BP 158/92 | HR 89 | Temp 98.0°F | Resp 20 | Ht 66.0 in | Wt 191.6 lb

## 2014-08-05 DIAGNOSIS — Z124 Encounter for screening for malignant neoplasm of cervix: Secondary | ICD-10-CM | POA: Diagnosis present

## 2014-08-05 DIAGNOSIS — C541 Malignant neoplasm of endometrium: Secondary | ICD-10-CM

## 2014-08-05 NOTE — Progress Notes (Signed)
Radiation Oncology         [phone] ________________________________  Name: Misty Alvarado MRN: [id number]  Date: 08/05/2014  DOB: 12/19/1932  Follow-Up Visit Note  CC: Nyoka Cowden, MD  Gordy Levan, MD  Diagnosis:  Stage T1a N0 high grade serous endometrial carcinoma with lymphovascular space invasion    Interval Since Last Radiation:  8  months  Narrative:  The patient returns today for routine follow-up.  She seems to be doing better at this time. She is more alert and responsive to questions. Patient's fecal incontinence spontaneously cleared up. Since the patient's fecal incontinence had cleared she has not seen by general surgery.  The patient denies any vaginal bleeding hematuria or rectal bleeding. She denies the pelvic pain.  The patient's appetite is good and she's gained several pounds over the past few months.  She did see gynecologic oncology earlier this spring with a good report.                        ALLERGIES:  is allergic to dexamethasone and rocephin.  Meds: Current Outpatient Prescriptions  Medication Sig Dispense Refill  . acetaminophen (TYLENOL) 650 MG CR tablet Take 650 mg by mouth every 8 (eight) hours as needed for pain.      . Cholecalciferol (VITAMIN D-3) 1000 UNITS CAPS Take 1 capsule by mouth daily.      Marland Kitchen ibuprofen (ADVIL,MOTRIN) 200 MG tablet Take 200 mg by mouth every 6 (six) hours as needed.      . magnesium 30 MG tablet Take 30 mg by mouth 2 (two) times daily.      . mometasone (NASONEX) 50 MCG/ACT nasal spray Place 2 sprays into the nose daily as needed (for allergies).      . pyridoxine (B-6) 100 MG tablet Take 100 mg by mouth daily.      . haloperidol (HALDOL) 1 MG tablet Take 1 tablet (1 mg total) by mouth 2 (two) times daily. 8:00 am and 8:00 pm  60 tablet  2  . HYDROcodone-acetaminophen (NORCO) 10-325 MG per tablet Take 1-2 tablets by mouth 2 (two) times daily as needed.  90 tablet  0  . hydrocortisone-pramoxine  (PROCTOFOAM-HC) rectal foam Place 1 applicator rectally 3 (three) times daily. Use internally in the rectum.  10 g  1  . hyoscyamine (OSCIMIN SR) 0.375 MG 12 hr tablet TAKE 1 TABLET BY MOUTH THREE TIMES A DAY AS NEEDED CRAMPING/SPASMS  90 tablet  0  . lidocaine (XYLOCAINE) 2 % solution Mix with desitin and apply to external hemorrhoids 2-3 times a day  100 mL  0  . nitrofurantoin, macrocrystal-monohydrate, (MACROBID) 100 MG capsule Take 1 capsule (100 mg total) by mouth 2 (two) times daily.  14 capsule  0  . ondansetron (ZOFRAN) 8 MG tablet Take 1-2 tablets (8-16 mg total) by mouth every 12 (twelve) hours as needed for nausea (Will not make you drowsy).  20 tablet  2  . OSCIMIN SR 0.375 MG 12 hr tablet TAKE 1 TABLET BY MOUTH 3 TIMES DAILY AS NEEDED FOR CRAMPING/SPASMS  90 tablet  0  . Probiotic Product (SOLUBLE FIBER/PROBIOTICS PO) Take by mouth.       No current facility-administered medications for this encounter.    Physical Findings: The patient is in no acute distress. Patient is alert and oriented.  height is 5\' 6"  (1.676 m) and weight is 191 lb 9.6 oz (86.909 kg). Her oral temperature is 98 F (36.7  C). Her blood pressure is 158/92 and her pulse is 89. Her respiration is 20. Marland Kitchen No palpable supraclavicular adenopathy. The lungs are clear to auscultation. The heart has a regular rhythm and rate. The abdomen is soft and nontender with normal bowel sounds. No inguinal adenopathy is appreciated. On pelvic examination the external genitalia are unremarkable. A speculum exam is performed with no mucosal lesions noted. A Pap smear was obtained of the proximal vagina. On bimanual and rectovaginal examination there no obvious pelvic masses however there was noted to be a lesion along the right perianal area which was firm and had a nodular white consistently. This lesion measured approximately 1 cm in greatest dimension. A Pap smear was obtained of this area in addition to the vaginal cuff.  Lab  Findings: Lab Results  Component Value Date   WBC 3.9 03/25/2014   HGB 12.3 03/25/2014   HCT 35.4 03/25/2014   MCV 93.0 03/25/2014   PLT 238 03/25/2014      Radiographic Findings: No results found.  Impression:  The patient is recovering from the effects of radiation. No evidence for recurrence within the vaginal vault but perianal area is suspicious for malignancy.  Plan:  Routine followup in 6 months. The patient will be seen by gynecologic oncology in November. I have made a referral to Gen. surgery for further evaluation of the patient's perianal lesion. The patient's daughter was present during the examination and is aware of this potential new problem.  ____________________________________ Blair Promise, MD

## 2014-08-05 NOTE — Addendum Note (Signed)
Encounter addended by: Blair Promise, MD on: 08/05/2014  5:24 PM<BR>     Documentation filed: Orders

## 2014-08-05 NOTE — Progress Notes (Signed)
Stevan Born here for follow up after treatment for endometrial cancer.  She denies diarrhea, bladder issues, vaginal/rectal bleeding and pain.  She reports fatigue.  Her weight is up 48 lbs from 03/25/14.  She denies using her dilator.

## 2014-08-07 LAB — CYTOLOGY - PAP

## 2014-08-08 ENCOUNTER — Telehealth: Payer: Self-pay | Admitting: *Deleted

## 2014-08-08 NOTE — Telephone Encounter (Signed)
Called patient's daughter - Freddy Finner to inform of appt. With Dr. Marcello Moores on 08/09/14 @ 9:40 am, lvm for a return call

## 2014-08-08 NOTE — Addendum Note (Signed)
Encounter addended by: Blair Promise, MD on: 08/08/2014  9:20 AM<BR>     Documentation filed: Orders

## 2014-08-09 ENCOUNTER — Ambulatory Visit (INDEPENDENT_AMBULATORY_CARE_PROVIDER_SITE_OTHER): Payer: Medicare Other | Admitting: General Surgery

## 2014-08-09 ENCOUNTER — Telehealth: Payer: Self-pay | Admitting: Oncology

## 2014-08-09 VITALS — BP 124/76 | HR 72 | Temp 97.4°F | Ht 66.0 in | Wt 191.0 lb

## 2014-08-09 DIAGNOSIS — K621 Rectal polyp: Secondary | ICD-10-CM

## 2014-08-09 DIAGNOSIS — K620 Anal polyp: Secondary | ICD-10-CM

## 2014-08-09 NOTE — Patient Instructions (Signed)
Patient Information following biopsy   This is a minor procedure, but problems may develop in rare cases.  The following are warning symptoms and signs that should alert you to a possible complication.  Please contact the office if any of these should occur:   Increased pain with bowel movements or sitting  Temperature over 100.4 F (oral)  Bleeding that is excessive (over one cup of clots or blood)  Redness or irritation outside the anus  Difficulty urinating  For your comfort please follow these instructions:   Maintain a high fiber diet so that your bowel movements will be soft  Take a fiber supplement twice a day (such as Metamucil, Benefiber, Citracel or Fibercon)  Sit in a tub of warm water 2-3 times a day for the first 2-3 days, as needed to soothe the area.  You may expect some pressure sensations in the anal area for 1-2 days  If you need pain medication, take Tylenol not aspirin or Ibuprofen products.  Make an appointment to see me in 3 weeks after the procedure

## 2014-08-09 NOTE — Progress Notes (Signed)
Chief Complaint  Patient presents with  . eval perianal lesion    HISTORY: Misty Alvarado is a 80 y.o. female who presents to the office with an anal mass.  She is s/p RT treatment for endometrial cancer.  Her last treatment was 8 months ago.  Other symptoms include nothing.   her bowel habits are regular and her bowel movements are soft.  She did have some problems with fecal incontinence and diarrhea during her treatments.   her fiber intake is dietary.  her last colonoscopy was about a year ago.  She had a couple polyps removed per her daughter.     Past Medical History  Diagnosis Date  . ALLERGIC RHINITIS 10/03/2007  . HYPERLIPIDEMIA 10/03/2007  . HYPERTENSION 10/03/2007  . Ulcer     gastric  . GERD (gastroesophageal reflux disease)     "ONCE IN A WHILE"  TUMS IF NEEDED  . Arthritis     KNEES  . Endometrial cancer     new diagnosis 03/02/13  . History of radiation therapy 10/22/13, 10/29/13, 11/05/13, 11/12/13, 11/19/13    proximal vagina 30 gray      Past Surgical History  Procedure Laterality Date  . Cardiac catheterization    . Tubal ligation    . Lithotripsy      for kidney stone  . Partial thyroidectomy for a growth      YRS AGO   . Robotic assisted total hysterectomy with bilateral salpingo oopherectomy Bilateral 04/24/2013    Procedure: ROBOTIC ASSISTED TOTAL HYSTERECTOMY WITH BILATERAL SALPINGO OOPHORECTOMY,  LYMPH NODE DISSECTION;  Surgeon: Imagene Gurney A. Alycia Rossetti, MD;  Location: [facility];  Service: Gynecology;  Laterality: Bilateral;  . Lymph node dissection N/A 04/24/2013    Procedure: LYMPH NODE DISSECTION;  Surgeon: Imagene Gurney A. Alycia Rossetti, MD;  Location: [facility];  Service: Gynecology;  Laterality: N/A;  . Abdominal hysterectomy    . Back surgery  1968    Ruptured disc repair        Current Outpatient Prescriptions  Medication Sig Dispense Refill  . acetaminophen (TYLENOL) 650 MG CR tablet Take 650 mg by mouth every 8 (eight) hours as needed for pain.      . Cholecalciferol  (VITAMIN D-3) 1000 UNITS CAPS Take 1 capsule by mouth daily.      Marland Kitchen ibuprofen (ADVIL,MOTRIN) 200 MG tablet Take 200 mg by mouth every 6 (six) hours as needed.      . magnesium 30 MG tablet Take 30 mg by mouth 2 (two) times daily.      . hydrocortisone-pramoxine (PROCTOFOAM-HC) rectal foam Place 1 applicator rectally 3 (three) times daily. Use internally in the rectum.  10 g  1   No current facility-administered medications for this visit.      Allergies  Allergen Reactions  . Dexamethasone Other (See Comments)    Mood disturbance, psychosis  . Rocephin [Ceftriaxone Sodium In Dextrose] Rash      Family History  Problem Relation Age of Onset  . Arthritis Mother   . Colon cancer Neg Hx   . Cancer Sister     1 deceased from lung cancer  . Cancer Brother     2 deceased from lung cancer    History   Social History  . Marital Status: Widowed    Spouse Name: N/A    Number of Children: N/A  . Years of Education: N/A   Social History Main Topics  . Smoking status: Never Smoker   . Smokeless tobacco: Never Used  .  Alcohol Use: No  . Drug Use: No  . Sexual Activity: Not on file   Other Topics Concern  . Not on file   Social History Narrative  . No narrative on file      REVIEW OF SYSTEMS - PERTINENT POSITIVES ONLY: Review of Systems - General ROS: negative for - chills, fever or weight loss Hematological and Lymphatic ROS: negative for - bleeding problems, blood clots or bruising Respiratory ROS: no cough, shortness of breath, or wheezing Cardiovascular ROS: no chest pain or dyspnea on exertion Gastrointestinal ROS: no abdominal pain, change in bowel habits, or black or bloody stools Genito-Urinary ROS: no dysuria, trouble voiding, or hematuria  EXAM: Filed Vitals:   08/09/14 0955  BP: 124/76  Pulse: 72  Temp: 97.4 F (36.3 C)    General appearance: alert and cooperative Resp: clear to auscultation bilaterally Cardio: regular rate and rhythm GI: soft,  non-tender; bowel sounds normal; no masses,  no organomegaly  Procedure: Anoscopy with biopsy Surgeon: Misty Alvarado Diagnosis: anal lesion  Assistant: Moffit After the risks and benefits were explained, verbal consent was obtained for above procedure.  Patient expresses concerns of possible recurrence of her cancer. She would like the polyp to be removed. Anesthesia: none Procedure: Anoscopy was performed using a standard anoscope. There was a large anal polyp projecting from her right posterior hemorrhoid. It was fibrotic in nature. Subcutaneous lidocaine was injected around the anal polyp. The polyp was removed using sharp dissection. Hemostasis was achieved using silver nitrate. The patient tolerated the procedure well.    ASSESSMENT AND PLAN: Misty Alvarado is a 80 y.o. female With what appears to be a benign anal polyp. This was removed and sent to pathology for further examination. Postoperative instructions were given to the patient. I'll see her back in 3 weeks to make sure things are healing appropriately. She should use a stool softener as needed.    Rosario Adie, MD Colon and Rectal Surgery / [facility]      Visit Diagnoses: 1. Anal polyp     Primary Care Physician: Nyoka Cowden, MD

## 2014-08-12 ENCOUNTER — Telehealth (INDEPENDENT_AMBULATORY_CARE_PROVIDER_SITE_OTHER): Payer: Self-pay

## 2014-08-12 LAB — CYTOLOGY - PAP

## 2014-08-12 NOTE — Telephone Encounter (Signed)
Called Bonnita Nasuti and informed her of the good results on both pap smears for [facility] per Dr. Sondra Come.  Bonnita Nasuti verbalized understanding.

## 2014-08-12 NOTE — Telephone Encounter (Signed)
Called pt to inform her that per Dr Marcello Moores her polyp was benign. Pt verbalized understanding

## 2014-08-27 ENCOUNTER — Ambulatory Visit (INDEPENDENT_AMBULATORY_CARE_PROVIDER_SITE_OTHER): Payer: Medicare Other | Admitting: General Surgery

## 2014-08-28 ENCOUNTER — Encounter (INDEPENDENT_AMBULATORY_CARE_PROVIDER_SITE_OTHER): Payer: Medicare Other | Admitting: General Surgery

## 2014-09-11 DIAGNOSIS — Z0279 Encounter for issue of other medical certificate: Secondary | ICD-10-CM

## 2014-09-19 ENCOUNTER — Encounter: Payer: Self-pay | Admitting: Internal Medicine

## 2014-09-19 ENCOUNTER — Ambulatory Visit (INDEPENDENT_AMBULATORY_CARE_PROVIDER_SITE_OTHER): Payer: Medicare Other | Admitting: Internal Medicine

## 2014-09-19 VITALS — BP 146/98 | HR 76 | Temp 98.3°F | Ht 66.0 in | Wt 201.0 lb

## 2014-09-19 DIAGNOSIS — E7800 Pure hypercholesterolemia, unspecified: Secondary | ICD-10-CM

## 2014-09-19 DIAGNOSIS — C541 Malignant neoplasm of endometrium: Secondary | ICD-10-CM

## 2014-09-19 DIAGNOSIS — I10 Essential (primary) hypertension: Secondary | ICD-10-CM

## 2014-09-19 DIAGNOSIS — K219 Gastro-esophageal reflux disease without esophagitis: Secondary | ICD-10-CM

## 2014-09-19 DIAGNOSIS — Z23 Encounter for immunization: Secondary | ICD-10-CM

## 2014-09-19 MED ORDER — LOSARTAN POTASSIUM-HCTZ 100-12.5 MG PO TABS: 1.0000 | ORAL_TABLET | Freq: Every day | ORAL | Status: DC

## 2014-09-19 NOTE — Progress Notes (Signed)
Subjective:    Patient ID: Misty Alvarado, female    DOB: 1933/03/21, 80 y.o.   MRN: [id number]  HPI 80 year-old patient who is seen today in followup.  She has a history of hypertension, but has been off of blood pressure medication for some time.  She has a history of endometrial cancer and had subsequent weight loss through chemotherapy.  Today she complains of some intermittent cough, which she feels may be due to reflux.  In general doing quite well.  She is accompanied by her daughter today. She has dyslipidemia, controlled on diet  Wt Readings from Last 3 Encounters:  09/19/14 201 lb (91.173 kg)  08/09/14 191 lb (86.637 kg)  08/05/14 191 lb 9.6 oz (86.909 kg)    BP Readings from Last 3 Encounters:  09/19/14 146/98  08/09/14 124/76  08/05/14 158/92    Past Medical History  Diagnosis Date  . ALLERGIC RHINITIS 10/03/2007  . HYPERLIPIDEMIA 10/03/2007  . HYPERTENSION 10/03/2007  . Ulcer     gastric  . GERD (gastroesophageal reflux disease)     "ONCE IN A WHILE"  TUMS IF NEEDED  . Arthritis     KNEES  . Endometrial cancer     new diagnosis 03/02/13  . History of radiation therapy 10/22/13, 10/29/13, 11/05/13, 11/12/13, 11/19/13    proximal vagina 30 gray    History   Social History  . Marital Status: Widowed    Spouse Name: N/A    Number of Children: N/A  . Years of Education: N/A   Occupational History  . Not on file.   Social History Main Topics  . Smoking status: Never Smoker   . Smokeless tobacco: Never Used  . Alcohol Use: No  . Drug Use: No  . Sexual Activity: Not on file   Other Topics Concern  . Not on file   Social History Narrative  . No narrative on file    Past Surgical History  Procedure Laterality Date  . Cardiac catheterization    . Tubal ligation    . Lithotripsy      for kidney stone  . Partial thyroidectomy for a growth      YRS AGO   . Robotic assisted total hysterectomy with bilateral salpingo oopherectomy Bilateral 04/24/2013     Procedure: ROBOTIC ASSISTED TOTAL HYSTERECTOMY WITH BILATERAL SALPINGO OOPHORECTOMY,  LYMPH NODE DISSECTION;  Surgeon: Imagene Gurney A. Alycia Rossetti, MD;  Location: [facility];  Service: Gynecology;  Laterality: Bilateral;  . Lymph node dissection N/A 04/24/2013    Procedure: LYMPH NODE DISSECTION;  Surgeon: Imagene Gurney A. Alycia Rossetti, MD;  Location: [facility];  Service: Gynecology;  Laterality: N/A;  . Abdominal hysterectomy    . Back surgery  1968    Ruptured disc repair    Family History  Problem Relation Age of Onset  . Arthritis Mother   . Colon cancer Neg Hx   . Cancer Sister     1 deceased from lung cancer  . Cancer Brother     2 deceased from lung cancer    Allergies  Allergen Reactions  . Dexamethasone Other (See Comments)    Mood disturbance, psychosis  . Rocephin [Ceftriaxone Sodium In Dextrose] Rash    Current Outpatient Prescriptions on File Prior to Visit  Medication Sig Dispense Refill  . acetaminophen (TYLENOL) 650 MG CR tablet Take 650 mg by mouth every 8 (eight) hours as needed for pain.      . Cholecalciferol (VITAMIN D-3) 1000 UNITS CAPS Take 1 capsule by  mouth daily.      Marland Kitchen ibuprofen (ADVIL,MOTRIN) 200 MG tablet Take 200 mg by mouth every 6 (six) hours as needed.      . magnesium 30 MG tablet Take 30 mg by mouth 2 (two) times daily.       No current facility-administered medications on file prior to visit.    BP 146/98  Pulse 76  Temp(Src) 98.3 F (36.8 C) (Oral)  Ht 5\' 6"  (1.676 m)  Wt 201 lb (91.173 kg)  BMI 32.46 kg/m2      Review of Systems  Constitutional: Positive for fatigue and unexpected weight change.  HENT: Negative for congestion, dental problem, hearing loss, rhinorrhea, sinus pressure, sore throat and tinnitus.   Eyes: Negative for pain, discharge and visual disturbance.  Respiratory: Negative for cough and shortness of breath.   Cardiovascular: Negative for chest pain, palpitations and leg swelling.  Gastrointestinal: Negative for nausea, vomiting,  abdominal pain, diarrhea, constipation, blood in stool and abdominal distention.  Genitourinary: Negative for dysuria, urgency, frequency, hematuria, flank pain, vaginal bleeding, vaginal discharge, difficulty urinating, vaginal pain and pelvic pain.  Musculoskeletal: Positive for gait problem. Negative for arthralgias and joint swelling.  Skin: Negative for rash.  Neurological: Negative for dizziness, syncope, speech difficulty, weakness, numbness and headaches.  Hematological: Negative for adenopathy.  Psychiatric/Behavioral: Negative for behavioral problems, dysphoric mood and agitation. The patient is not nervous/anxious.        Objective:   Physical Exam  Constitutional: She is oriented to person, place, and time. She appears well-developed and well-nourished.  Blood pressure 160/100  HENT:  Head: Normocephalic.  Right Ear: External ear normal.  Left Ear: External ear normal.  Mouth/Throat: Oropharynx is clear and moist.  Eyes: Conjunctivae and EOM are normal. Pupils are equal, round, and reactive to light.  Neck: Normal range of motion. Neck supple. No thyromegaly present.  Cardiovascular: Normal rate, regular rhythm, normal heart sounds and intact distal pulses.   Pulmonary/Chest: Effort normal and breath sounds normal.  Abdominal: Soft. Bowel sounds are normal. She exhibits no mass. There is no tenderness.  Musculoskeletal: Normal range of motion.  Lymphadenopathy:    She has no cervical adenopathy.  Neurological: She is alert and oriented to person, place, and time.  Skin: Skin is warm and dry. No rash noted.  Psychiatric: She has a normal mood and affect. Her behavior is normal.          Assessment & Plan:   Hypertension.  We'll resume Hyzaar Dyslipidemia Gastroesophageal reflux disease.  Maybe a cause of her cough.  Antireflux regimen discussed and encouraged.  Information dispensed Low salt diet recommended  Recheck 6 weeks

## 2014-09-19 NOTE — Progress Notes (Signed)
Pre visit review using our clinic review tool, if applicable. No additional management support is needed unless otherwise documented below in the visit note. 

## 2014-09-19 NOTE — Patient Instructions (Addendum)
Avoids foods high in acid such as tomatoes citrus juices, and spicy foods.  Avoid eating within two hours of lying down or before exercising.  Do not overheat.  Try smaller more frequent meals.  Limit your sodium (Salt) intake    It is important that you exercise regularly, at least 20 minutes 3 to 4 times per week.  If you develop chest pain or shortness of breath seek  medical attention.  You need to lose weight.  Consider a lower calorie diet and regular exercise.Gastroesophageal Reflux Disease, Adult Gastroesophageal reflux disease (GERD) happens when acid from your stomach flows up into the esophagus. When acid comes in contact with the esophagus, the acid causes soreness (inflammation) in the esophagus. Over time, GERD may create small holes (ulcers) in the lining of the esophagus. CAUSES   Increased body weight. This puts pressure on the stomach, making acid rise from the stomach into the esophagus.  Smoking. This increases acid production in the stomach.  Drinking alcohol. This causes decreased pressure in the lower esophageal sphincter (valve or ring of muscle between the esophagus and stomach), allowing acid from the stomach into the esophagus.  Late evening meals and a full stomach. This increases pressure and acid production in the stomach.  A malformed lower esophageal sphincter. Sometimes, no cause is found. SYMPTOMS   Burning pain in the lower part of the mid-chest behind the breastbone and in the mid-stomach area. This may occur twice a week or more often.  Trouble swallowing.  Sore throat.  Dry cough.  Asthma-like symptoms including chest tightness, shortness of breath, or wheezing. DIAGNOSIS  Your caregiver may be able to diagnose GERD based on your symptoms. In some cases, X-rays and other tests may be done to check for complications or to check the condition of your stomach and esophagus. TREATMENT  Your caregiver may recommend over-the-counter or prescription  medicines to help decrease acid production. Ask your caregiver before starting or adding any new medicines.  HOME CARE INSTRUCTIONS   Change the factors that you can control. Ask your caregiver for guidance concerning weight loss, quitting smoking, and alcohol consumption.  Avoid foods and drinks that make your symptoms worse, such as:  Caffeine or alcoholic drinks.  Chocolate.  Peppermint or mint flavorings.  Garlic and onions.  Spicy foods.  Citrus fruits, such as oranges, lemons, or limes.  Tomato-based foods such as sauce, chili, salsa, and pizza.  Fried and fatty foods.  Avoid lying down for the 3 hours prior to your bedtime or prior to taking a nap.  Eat small, frequent meals instead of large meals.  Wear loose-fitting clothing. Do not wear anything tight around your waist that causes pressure on your stomach.  Raise the head of your bed 6 to 8 inches with wood blocks to help you sleep. Extra pillows will not help.  Only take over-the-counter or prescription medicines for pain, discomfort, or fever as directed by your caregiver.  Do not take aspirin, ibuprofen, or other nonsteroidal anti-inflammatory drugs (NSAIDs). SEEK IMMEDIATE MEDICAL CARE IF:   You have pain in your arms, neck, jaw, teeth, or back.  Your pain increases or changes in intensity or duration.  You develop nausea, vomiting, or sweating (diaphoresis).  You develop shortness of breath, or you faint.  Your vomit is green, yellow, black, or looks like coffee grounds or blood.  Your stool is red, bloody, or black. These symptoms could be signs of other problems, such as heart disease, gastric bleeding, or esophageal  bleeding. MAKE SURE YOU:   Understand these instructions.  Will watch your condition.  Will get help right away if you are not doing well or get worse. Document Released: 09/08/2005 Document Revised: 02/21/2012 Document Reviewed: 06/18/2011 [facility] Patient Information 2015  [place], [place]. This information is not intended to replace advice given to you by your health care provider. Make sure you discuss any questions you have with your health care provider. Food Choices for Gastroesophageal Reflux Disease When you have gastroesophageal reflux disease (GERD), the foods you eat and your eating habits are very important. Choosing the right foods can help ease the discomfort of GERD. WHAT GENERAL GUIDELINES DO I NEED TO FOLLOW?  Choose fruits, vegetables, whole grains, low-fat dairy products, and low-fat meat, fish, and poultry.  Limit fats such as oils, salad dressings, butter, nuts, and avocado.  Keep a food diary to identify foods that cause symptoms.  Avoid foods that cause reflux. These may be different for different people.  Eat frequent small meals instead of three large meals each day.  Eat your meals slowly, in a relaxed setting.  Limit fried foods.  Cook foods using methods other than frying.  Avoid drinking alcohol.  Avoid drinking large amounts of liquids with your meals.  Avoid bending over or lying down until 2-3 hours after eating. WHAT FOODS ARE NOT RECOMMENDED? The following are some foods and drinks that may worsen your symptoms: Vegetables Tomatoes. Tomato juice. Tomato and spaghetti sauce. Chili peppers. Onion and garlic. Horseradish. Fruits Oranges, grapefruit, and lemon (fruit and juice). Meats High-fat meats, fish, and poultry. This includes hot dogs, ribs, ham, sausage, salami, and bacon. Dairy Whole milk and chocolate milk. Sour cream. Cream. Butter. Ice cream. Cream cheese.  Beverages Coffee and tea, with or without caffeine. Carbonated beverages or energy drinks. Condiments Hot sauce. Barbecue sauce.  Sweets/Desserts Chocolate and cocoa. Donuts. Peppermint and spearmint. Fats and Oils High-fat foods, including [place] fries and potato chips. Other Vinegar. Strong spices, such as black pepper, white pepper, red pepper,  cayenne, curry powder, cloves, ginger, and chili powder. The items listed above may not be a complete list of foods and beverages to avoid. Contact your dietitian for more information. Document Released: 11/29/2005 Document Revised: 12/04/2013 Document Reviewed: 10/03/2013 [facility] Patient Information 2015 [place], [place]. This information is not intended to replace advice given to you by your health care provider. Make sure you discuss any questions you have with your health care provider.

## 2014-09-28 ENCOUNTER — Telehealth: Payer: Self-pay | Admitting: Oncology

## 2014-09-28 NOTE — Telephone Encounter (Signed)
, °

## 2014-09-30 ENCOUNTER — Encounter: Payer: Self-pay | Admitting: Internal Medicine

## 2014-09-30 ENCOUNTER — Ambulatory Visit (INDEPENDENT_AMBULATORY_CARE_PROVIDER_SITE_OTHER): Payer: Medicare Other | Admitting: Internal Medicine

## 2014-09-30 VITALS — BP 126/80 | HR 100 | Temp 98.2°F | Resp 20 | Ht 66.0 in | Wt 200.0 lb

## 2014-09-30 DIAGNOSIS — J302 Other seasonal allergic rhinitis: Secondary | ICD-10-CM

## 2014-09-30 MED ORDER — PREDNISONE 10 MG PO TABS: 10.0000 mg | ORAL_TABLET | Freq: Two times a day (BID) | ORAL | Status: DC

## 2014-09-30 NOTE — Patient Instructions (Signed)
Take over-the-counter expectorants and cough medications such as  Mucinex DM.  Call if there is no improvement in 5 to 7 days or if  you develop worsening cough, fever, or new symptoms, such as shortness of breath or chest pain.  Please check your blood pressure on a regular basis.  If it is consistently greater than 150/90, please make an office appointment.  Return in 6 months for follow-up

## 2014-09-30 NOTE — Progress Notes (Signed)
Subjective:    Patient ID: Misty Alvarado, female    DOB: [birth year], 80 y.o.   MRN: [id number]  HPI 80 year old patient who has a history allergic rhinitis.  She presents with a 4 day history of nonproductive cough and chest congestion.  No fever or other complaints.  No shortness of breath, wheezing, or chest pain.  She has been using Mucinex DM with some benefit.  She does have a history of seasonal allergies.  Past Medical History  Diagnosis Date  . ALLERGIC RHINITIS 10/03/2007  . HYPERLIPIDEMIA 10/03/2007  . HYPERTENSION 10/03/2007  . Ulcer     gastric  . GERD (gastroesophageal reflux disease)     "ONCE IN A WHILE"  TUMS IF NEEDED  . Arthritis     KNEES  . Endometrial cancer     new diagnosis 03/02/13  . History of radiation therapy 10/22/13, 10/29/13, 11/05/13, 11/12/13, 11/19/13    proximal vagina 30 gray    History   Social History  . Marital Status: Widowed    Spouse Name: N/A    Number of Children: N/A  . Years of Education: N/A   Occupational History  . Not on file.   Social History Main Topics  . Smoking status: Never Smoker   . Smokeless tobacco: Never Used  . Alcohol Use: No  . Drug Use: No  . Sexual Activity: Not on file   Other Topics Concern  . Not on file   Social History Narrative  . No narrative on file    Past Surgical History  Procedure Laterality Date  . Cardiac catheterization    . Tubal ligation    . Lithotripsy      for kidney stone  . Partial thyroidectomy for a growth      YRS AGO   . Robotic assisted total hysterectomy with bilateral salpingo oopherectomy Bilateral 04/24/2013    Procedure: ROBOTIC ASSISTED TOTAL HYSTERECTOMY WITH BILATERAL SALPINGO OOPHORECTOMY,  LYMPH NODE DISSECTION;  Surgeon: Imagene Gurney A. Alycia Rossetti, MD;  Location: [facility];  Service: Gynecology;  Laterality: Bilateral;  . Lymph node dissection N/A 04/24/2013    Procedure: LYMPH NODE DISSECTION;  Surgeon: Imagene Gurney A. Alycia Rossetti, MD;  Location: [facility];  Service: Gynecology;   Laterality: N/A;  . Abdominal hysterectomy    . Back surgery  1968    Ruptured disc repair    Family History  Problem Relation Age of Onset  . Arthritis Mother   . Colon cancer Neg Hx   . Cancer Sister     1 deceased from lung cancer  . Cancer Brother     2 deceased from lung cancer    Allergies  Allergen Reactions  . Dexamethasone Other (See Comments)    Mood disturbance, psychosis  . Rocephin [Ceftriaxone Sodium In Dextrose] Rash    Current Outpatient Prescriptions on File Prior to Visit  Medication Sig Dispense Refill  . acetaminophen (TYLENOL) 650 MG CR tablet Take 650 mg by mouth every 8 (eight) hours as needed for pain.      . Cholecalciferol (VITAMIN D-3) 1000 UNITS CAPS Take 1 capsule by mouth daily.      Marland Kitchen co-enzyme Q-10 30 MG capsule Take 30 mg by mouth daily.      Marland Kitchen ibuprofen (ADVIL,MOTRIN) 200 MG tablet Take 200 mg by mouth every 6 (six) hours as needed.      Marland Kitchen losartan-hydrochlorothiazide (HYZAAR) 100-12.5 MG per tablet Take 1 tablet by mouth daily.  90 tablet  3  . magnesium 30 MG  tablet Take 30 mg by mouth 2 (two) times daily.       No current facility-administered medications on file prior to visit.    BP 126/80  Pulse 100  Temp(Src) 98.2 F (36.8 C) (Oral)  Resp 20  Ht 5\' 6"  (1.676 m)  Wt 200 lb (90.719 kg)  BMI 32.30 kg/m2  SpO2 98%     .   Review of Systems  Constitutional: Negative.   HENT: Negative for congestion, dental problem, hearing loss, rhinorrhea, sinus pressure, sore throat and tinnitus.   Eyes: Negative for pain, discharge and visual disturbance.  Respiratory: Positive for cough. Negative for shortness of breath.   Cardiovascular: Negative for chest pain, palpitations and leg swelling.  Gastrointestinal: Negative for nausea, vomiting, abdominal pain, diarrhea, constipation, blood in stool and abdominal distention.  Genitourinary: Negative for dysuria, urgency, frequency, hematuria, flank pain, vaginal bleeding, vaginal  discharge, difficulty urinating, vaginal pain and pelvic pain.  Musculoskeletal: Negative for arthralgias, gait problem and joint swelling.  Skin: Negative for rash.  Neurological: Negative for dizziness, syncope, speech difficulty, weakness, numbness and headaches.  Hematological: Negative for adenopathy.  Psychiatric/Behavioral: Negative for behavioral problems, dysphoric mood and agitation. The patient is not nervous/anxious.        Objective:   Physical Exam  Constitutional: She is oriented to person, place, and time. She appears well-developed and well-nourished.  HENT:  Head: Normocephalic.  Right Ear: External ear normal.  Left Ear: External ear normal.  Mouth/Throat: Oropharynx is clear and moist.  Eyes: Conjunctivae and EOM are normal. Pupils are equal, round, and reactive to light.  Neck: Normal range of motion. Neck supple. No thyromegaly present.  Cardiovascular: Normal rate, regular rhythm, normal heart sounds and intact distal pulses.   Pulmonary/Chest: Effort normal and breath sounds normal. No respiratory distress. She has no wheezes. She has no rales.  Abdominal: Soft. Bowel sounds are normal. She exhibits no mass. There is no tenderness.  Musculoskeletal: Normal range of motion.  Lymphadenopathy:    She has no cervical adenopathy.  Neurological: She is alert and oriented to person, place, and time.  Skin: Skin is warm and dry. No rash noted.  Psychiatric: She has a normal mood and affect. Her behavior is normal.          Assessment & Plan:   Allergic rhinitis exacerbation.  We'll continue Mucinex DM for cough.  Will treat with prednisone 10 mg twice a day for 7 days Dyslipidemia Hypertension stable

## 2014-09-30 NOTE — Progress Notes (Signed)
Pre visit review using our clinic review tool, if applicable. No additional management support is needed unless otherwise documented below in the visit note. 

## 2014-10-28 ENCOUNTER — Ambulatory Visit: Payer: Medicare Other | Admitting: Radiation Oncology

## 2014-10-30 ENCOUNTER — Ambulatory Visit
Admission: RE | Admit: 2014-10-30 | Discharge: 2014-10-30 | Disposition: A | Discharge status: Home / Self Care | Payer: Medicare Other | Source: Ambulatory Visit | Attending: Radiation Oncology | Admitting: Radiation Oncology

## 2014-10-30 ENCOUNTER — Other Ambulatory Visit (HOSPITAL_COMMUNITY)
Admission: RE | Admit: 2014-10-30 | Discharge: 2014-10-30 | Disposition: A | Discharge status: Home / Self Care | Payer: Medicare Other | Source: Ambulatory Visit | Attending: Radiation Oncology | Admitting: Radiation Oncology

## 2014-10-30 ENCOUNTER — Encounter: Payer: Self-pay | Admitting: Radiation Oncology

## 2014-10-30 VITALS — BP 153/86 | HR 113 | Temp 98.4°F | Resp 24 | Ht 66.0 in | Wt 207.8 lb

## 2014-10-30 DIAGNOSIS — Z124 Encounter for screening for malignant neoplasm of cervix: Secondary | ICD-10-CM | POA: Diagnosis present

## 2014-10-30 DIAGNOSIS — C541 Malignant neoplasm of endometrium: Secondary | ICD-10-CM

## 2014-10-30 NOTE — Progress Notes (Signed)
  Radiation Oncology         [phone] ________________________________  Name: Misty Alvarado MRN: [id number]  Date: 10/30/2014  DOB: [birth year]  Follow-Up Visit Note  CC: Nyoka Cowden, MD  Gordy Levan, MD    ICD-9-CM ICD-10-CM   1. 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 The patient was seen by general surgery. The lesion along the perianal area was removed and returned a benign polyp. This has had no further problems with fecal incontinence. She continues to have some bladder incontinence. She denies any vaginal bleeding hematuria or rectal bleeding or pelvic pain area.                             ALLERGIES:  is allergic to dexamethasone and rocephin.  Meds: Current Outpatient Prescriptions  Medication Sig Dispense Refill  . acetaminophen (TYLENOL) 650 MG CR tablet Take 650 mg by mouth every 8 (eight) hours as needed for pain.    . Cholecalciferol (VITAMIN D-3) 1000 UNITS CAPS Take 1 capsule by mouth daily.    Marland Kitchen co-enzyme Q-10 30 MG capsule Take 30 mg by mouth daily.    Marland Kitchen Dextromethorphan-Guaifenesin (MUCINEX DM MAXIMUM STRENGTH) 60-1200 MG TB12 Take 1 tablet by mouth 2 (two) times daily.    Marland Kitchen ibuprofen (ADVIL,MOTRIN) 200 MG tablet Take 200 mg by mouth every 6 (six) hours as needed.    Marland Kitchen losartan-hydrochlorothiazide (HYZAAR) 100-12.5 MG per tablet Take 1 tablet by mouth daily. 90 tablet 3  . magnesium 30 MG tablet Take 30 mg by mouth 2 (two) times daily.     No current facility-administered medications for this encounter.    Physical Findings: The patient is in no acute distress. Patient is alert and oriented.  height is 5\' 6"  (1.676 m) and weight is  207 lb 12.8 oz (94.257 kg). Her oral temperature is 98.4 F (36.9 C). Her blood pressure is 153/86 and her pulse is 113. Her respiration is 24. .  The palpable subclavicular or axillary adenopathy. The lungs are clear to auscultation. The heart has a regular rhythm and rate. The abdomen is soft and nontender with normal bowel sounds. No inguinal adenopathy appreciated. On pelvic examination the external genitalia are unremarkable. A speculum exam is performed. The vaginal cuff is somewhat difficult to visualize. A Pap smear is pain in the proximal vagina. On bimanual and rectovaginal examination there no obvious pelvic masses appreciated. Lab Findings: Lab Results  Component Value Date   WBC 3.9 03/25/2014   HGB 12.3 03/25/2014   HCT 35.4 03/25/2014   MCV 93.0 03/25/2014   PLT 238 03/25/2014    Radiographic Findings: No results found.  Impression:  No evidence for recurrence on clinical exam today, Pap smear pending  Plan:  Routine followup in 6 months. In the interim the patient will be seen by gynecologic oncology.  ____________________________________ Blair Promise, MD

## 2014-10-30 NOTE — Progress Notes (Signed)
Misty Alvarado for follow up after treatment for endometrial cancer.  She denies pain.  She reports incontinence of bladder when coughing.  She denies diarrhea and constipation.  She denies vaginal/rectal bleeding.  She reports fatigue and trouble "getting going."  She reports shortness of breath with activity.  Her oxgyen saturation today was 98% on room air.  She reports a good appetite.

## 2014-10-31 LAB — CYTOLOGY - PAP

## 2014-11-01 ENCOUNTER — Telehealth: Payer: Self-pay | Admitting: Oncology

## 2014-11-01 NOTE — Telephone Encounter (Signed)
Called Misty Alvarado and let her know the good results on her pap smear per Dr. Sondra Come.  Misty Alvarado verbalized understanding.

## 2014-12-16 DIAGNOSIS — H5202 Hypermetropia, left eye: Secondary | ICD-10-CM | POA: Diagnosis not present

## 2015-01-10 ENCOUNTER — Emergency Department (INDEPENDENT_AMBULATORY_CARE_PROVIDER_SITE_OTHER)
Admission: EM | Admit: 2015-01-10 | Discharge: 2015-01-10 | Disposition: A | Discharge status: Home / Self Care | Payer: Medicare Other | Source: Home / Self Care | Attending: Family Medicine | Admitting: Family Medicine

## 2015-01-10 ENCOUNTER — Emergency Department (INDEPENDENT_AMBULATORY_CARE_PROVIDER_SITE_OTHER): Payer: Medicare Other

## 2015-01-10 ENCOUNTER — Encounter (HOSPITAL_COMMUNITY): Payer: Self-pay | Admitting: Emergency Medicine

## 2015-01-10 DIAGNOSIS — J189 Pneumonia, unspecified organism: Secondary | ICD-10-CM | POA: Diagnosis not present

## 2015-01-10 DIAGNOSIS — R0781 Pleurodynia: Secondary | ICD-10-CM | POA: Diagnosis not present

## 2015-01-10 DIAGNOSIS — R918 Other nonspecific abnormal finding of lung field: Secondary | ICD-10-CM | POA: Diagnosis not present

## 2015-01-10 LAB — POCT URINALYSIS DIP (DEVICE)
BILIRUBIN URINE: NEGATIVE
Glucose, UA: NEGATIVE mg/dL
Hgb urine dipstick: NEGATIVE
Ketones, ur: NEGATIVE mg/dL
Nitrite: NEGATIVE
Protein, ur: NEGATIVE mg/dL
SPECIFIC GRAVITY, URINE: 1.01 (ref 1.005–1.030)
Urobilinogen, UA: 0.2 mg/dL (ref 0.0–1.0)
pH: 7 (ref 5.0–8.0)

## 2015-01-10 MED ORDER — AZITHROMYCIN 250 MG PO TABS: 250.0000 mg | ORAL_TABLET | Freq: Every day | ORAL | Status: DC

## 2015-01-10 NOTE — Discharge Instructions (Signed)
Please follow up with your PCP in 2 days. If symptoms worsen despite taking prescribed medication, please report to your nearest ER for re-evaluation.  Pneumonia Pneumonia is an infection of the lungs.  CAUSES Pneumonia may be caused by bacteria or a virus. Usually, these infections are caused by breathing infectious particles into the lungs (respiratory tract). SIGNS AND SYMPTOMS   Cough.  Fever.  Chest pain.  Increased rate of breathing.  Wheezing.  Mucus production. DIAGNOSIS  If you have the common symptoms of pneumonia, your health care provider will typically confirm the diagnosis with a chest X-ray. The X-ray will show an abnormality in the lung (pulmonary infiltrate) if you have pneumonia. Other tests of your blood, urine, or sputum may be done to find the specific cause of your pneumonia. Your health care provider may also do tests (blood gases or pulse oximetry) to see how well your lungs are working. TREATMENT  Some forms of pneumonia may be spread to other people when you cough or sneeze. You may be asked to wear a mask before and during your exam. Pneumonia that is caused by bacteria is treated with antibiotic medicine. Pneumonia that is caused by the influenza virus may be treated with an antiviral medicine. Most other viral infections must run their course. These infections will not respond to antibiotics.  HOME CARE INSTRUCTIONS   Cough suppressants may be used if you are losing too much rest. However, coughing protects you by clearing your lungs. You should avoid using cough suppressants if you can.  Your health care provider may have prescribed medicine if he or she thinks your pneumonia is caused by bacteria or influenza. Finish your medicine even if you start to feel better.  Your health care provider may also prescribe an expectorant. This loosens the mucus to be coughed up.  Take medicines only as directed by your health care provider.  Do not smoke. Smoking is a  common cause of bronchitis and can contribute to pneumonia. If you are a smoker and continue to smoke, your cough may last several weeks after your pneumonia has cleared.  A cold steam vaporizer or humidifier in your room or home may help loosen mucus.  Coughing is often worse at night. Sleeping in a semi-upright position in a recliner or using a couple pillows under your head will help with this.  Get rest as you feel it is needed. Your body will usually let you know when you need to rest. PREVENTION A pneumococcal shot (vaccine) is available to prevent a common bacterial cause of pneumonia. This is usually suggested for:  People over 46 years old.  Patients on chemotherapy.  People with chronic lung problems, such as bronchitis or emphysema.  People with immune system problems. If you are over 65 or have a high risk condition, you may receive the pneumococcal vaccine if you have not received it before. In some countries, a routine influenza vaccine is also recommended. This vaccine can help prevent some cases of pneumonia.You may be offered the influenza vaccine as part of your care. If you smoke, it is time to quit. You may receive instructions on how to stop smoking. Your health care provider can provide medicines and counseling to help you quit. SEEK MEDICAL CARE IF: You have a fever. SEEK IMMEDIATE MEDICAL CARE IF:   Your illness becomes worse. This is especially true if you are elderly or weakened from any other disease.  You cannot control your cough with suppressants and are losing  sleep.  You begin coughing up blood.  You develop pain which is getting worse or is uncontrolled with medicines.  Any of the symptoms which initially brought you in for treatment are getting worse rather than better.  You develop shortness of breath or chest pain. MAKE SURE YOU:   Understand these instructions.  Will watch your condition.  Will get help right away if you are not doing well  or get worse. Document Released: 11/29/2005 Document Revised: 04/15/2014 Document Reviewed: 02/18/2011 [facility] Patient Information 2015 [place], [place]. This information is not intended to replace advice given to you by your health care provider. Make sure you discuss any questions you have with your health care provider.

## 2015-01-10 NOTE — ED Provider Notes (Signed)
CSN: [id number]     Arrival date & time 01/10/15  1219 History   First MD Initiated Contact with Patient 01/10/15 1259     Chief Complaint  Patient presents with  . Back Pain   (Consider location/radiation/quality/duration/timing/severity/associated sxs/prior Treatment) Patient is a 81 y.o. female presenting with back pain. The history is provided by the patient and a relative.  Back Pain Location:  Thoracic spine (on right) Quality:  Stiffness Stiffness is present:  All day Radiates to:  Does not radiate Pain severity:  Moderate Onset quality:  Gradual Duration:  3 days Timing:  Constant Progression:  Worsening Chronicity:  New Context: not falling, not lifting heavy objects, not recent illness and not recent injury   Ineffective treatments:  NSAIDs Associated symptoms: no abdominal pain, no abdominal swelling, no bladder incontinence, no bowel incontinence, no chest pain, no dysuria, no fever, no numbness, no paresthesias and no weakness   Associated symptoms comment:  No rash, fever, cough, dyspnea   Past Medical History  Diagnosis Date  . ALLERGIC RHINITIS 10/03/2007  . HYPERLIPIDEMIA 10/03/2007  . HYPERTENSION 10/03/2007  . Ulcer     gastric  . GERD (gastroesophageal reflux disease)     "ONCE IN A WHILE"  TUMS IF NEEDED  . Arthritis     KNEES  . Endometrial cancer     new diagnosis 03/02/13  . History of radiation therapy 10/22/13, 10/29/13, 11/05/13, 11/12/13, 11/19/13    proximal vagina 30 gray   Past Surgical History  Procedure Laterality Date  . Cardiac catheterization    . Tubal ligation    . Lithotripsy      for kidney stone  . Partial thyroidectomy for a growth      YRS AGO   . Robotic assisted total hysterectomy with bilateral salpingo oopherectomy Bilateral 04/24/2013    Procedure: ROBOTIC ASSISTED TOTAL HYSTERECTOMY WITH BILATERAL SALPINGO OOPHORECTOMY,  LYMPH NODE DISSECTION;  Surgeon: Imagene Gurney A. Alycia Rossetti, MD;  Location: [facility];  Service: Gynecology;   Laterality: Bilateral;  . Lymph node dissection N/A 04/24/2013    Procedure: LYMPH NODE DISSECTION;  Surgeon: Imagene Gurney A. Alycia Rossetti, MD;  Location: [facility];  Service: Gynecology;  Laterality: N/A;  . Abdominal hysterectomy    . Back surgery  1968    Ruptured disc repair   Family History  Problem Relation Age of Onset  . Arthritis Mother   . Colon cancer Neg Hx   . Cancer Sister     1 deceased from lung cancer  . Cancer Brother     2 deceased from lung cancer   History  Substance Use Topics  . Smoking status: Never Smoker   . Smokeless tobacco: Never Used  . Alcohol Use: No   OB History    No data available     Review of Systems  Constitutional: Negative for fever, chills and fatigue.  HENT: Negative.   Respiratory: Negative.   Cardiovascular: Negative for chest pain.  Gastrointestinal: Negative.  Negative for abdominal pain and bowel incontinence.  Genitourinary: Negative.  Negative for bladder incontinence and dysuria.  Musculoskeletal: Positive for back pain.  Skin: Negative.   Neurological: Negative for weakness, numbness and paresthesias.    Allergies  Dexamethasone and Rocephin  Home Medications   Prior to Admission medications   Medication Sig Start Date End Date Taking? Authorizing Provider  acetaminophen (TYLENOL) 650 MG CR tablet Take 650 mg by mouth every 8 (eight) hours as needed for pain.    Historical Provider, MD  azithromycin [facility])  250 MG tablet Take 1 tablet (250 mg total) by mouth daily. Take first 2 tablets together, then 1 every day until finished. 01/10/15   Audelia Hives Lety Cullens, PA  Cholecalciferol (VITAMIN D-3) 1000 UNITS CAPS Take 1 capsule by mouth daily.    Historical Provider, MD  co-enzyme Q-10 30 MG capsule Take 30 mg by mouth daily.    Historical Provider, MD  Dextromethorphan-Guaifenesin (MUCINEX DM MAXIMUM STRENGTH) 60-1200 MG TB12 Take 1 tablet by mouth 2 (two) times daily.    Historical Provider, MD  ibuprofen (ADVIL,MOTRIN) 200 MG  tablet Take 200 mg by mouth every 6 (six) hours as needed.    Historical Provider, MD  losartan-hydrochlorothiazide (HYZAAR) 100-12.5 MG per tablet Take 1 tablet by mouth daily. 09/19/14   Marletta Lor, MD  magnesium 30 MG tablet Take 30 mg by mouth 2 (two) times daily.    Historical Provider, MD   BP 155/91 mmHg  Pulse 83  Temp(Src) 97.7 F (36.5 C) (Oral)  Resp 18  SpO2 97% Physical Exam  Constitutional: She is oriented to person, place, and time. She appears well-developed and well-nourished. No distress.  HENT:  Head: Normocephalic and atraumatic.  Eyes: Conjunctivae are normal. No scleral icterus.  Cardiovascular: Normal rate, regular rhythm and normal heart sounds.   Pulmonary/Chest: Effort normal and breath sounds normal. No respiratory distress. She has no decreased breath sounds. She has no wheezes.    Abdominal: Soft. Bowel sounds are normal. She exhibits no distension. There is no tenderness.  Musculoskeletal: Normal range of motion.  Neurological: She is alert and oriented to person, place, and time.  Skin: Skin is warm and dry.  Psychiatric: She has a normal mood and affect. Her behavior is normal.  Nursing note and vitals reviewed.   ED Course  Procedures (including critical care time) Labs Review Labs Reviewed  POCT URINALYSIS DIP (DEVICE) - Abnormal; Notable for the following:    Leukocytes, UA TRACE (*)    All other components within normal limits    Imaging Review Dg Chest 2 View  01/10/2015   CLINICAL DATA:  Rib pain.  No known injury.  Initial evaluation.  EXAM: CHEST  2 VIEW  COMPARISON:  None.  FINDINGS: Mediastinum and hilar structures normal. Left base infiltrate consistent with pneumonia. No pleural effusion or pneumothorax. Heart size normal. Surgical clip upper chest.  IMPRESSION: Left base infiltrate noted consistent with pneumonia.   Electronically Signed   By: Marcello Moores  Register   On: 01/10/2015 14:13     MDM   1. CAP (community acquired  pneumonia)   Patient is allergic to rocephin, therefore, dose not given at time of diagnosis. Will treat as outpatient with azithromycin. No fever, hypotension of hypoxia. Patient and daughter made aware of diagnosis and need for close follow up with PCP.  Lutricia Feil, [place] 01/10/15 [phone]

## 2015-01-10 NOTE — ED Notes (Signed)
Pt states that she has had back pain for 3 days.

## 2015-01-15 ENCOUNTER — Encounter: Payer: Self-pay | Admitting: Internal Medicine

## 2015-01-15 ENCOUNTER — Ambulatory Visit (INDEPENDENT_AMBULATORY_CARE_PROVIDER_SITE_OTHER): Payer: Medicare Other | Admitting: Internal Medicine

## 2015-01-15 VITALS — BP 166/100 | HR 92 | Temp 99.1°F | Resp 20 | Ht 66.0 in | Wt 211.0 lb

## 2015-01-15 DIAGNOSIS — Z8701 Personal history of pneumonia (recurrent): Secondary | ICD-10-CM | POA: Diagnosis not present

## 2015-01-15 DIAGNOSIS — E876 Hypokalemia: Secondary | ICD-10-CM

## 2015-01-15 NOTE — Progress Notes (Signed)
Subjective:    Patient ID: Misty Alvarado, female    DOB: 08/04/1933, 81 y.o.   MRN: [id number]  HPI 81 year old patient who is seen in the ED recently and treated for a community-acquired pneumonia.  She presented with mild cough and a chief complaint of pain in the right lower posterior back area.  A chest x-ray was reviewed.  This suggested an infiltrate at the right base with blunting of the right hemidiaphragm.  Patient generally feels well with resolution of her back pain and cough.  She is accompanied by her daughter.  She is followed closely by oncology.  Due to a history of endometrial cancer. She has treated hypertension which has been stable ED records reviewed  Past Medical History  Diagnosis Date  . ALLERGIC RHINITIS 10/03/2007  . HYPERLIPIDEMIA 10/03/2007  . HYPERTENSION 10/03/2007  . Ulcer     gastric  . GERD (gastroesophageal reflux disease)     "ONCE IN A WHILE"  TUMS IF NEEDED  . Arthritis     KNEES  . Endometrial cancer     new diagnosis 03/02/13  . History of radiation therapy 10/22/13, 10/29/13, 11/05/13, 11/12/13, 11/19/13    proximal vagina 30 gray    History   Social History  . Marital Status: Widowed    Spouse Name: N/A    Number of Children: N/A  . Years of Education: N/A   Occupational History  . Not on file.   Social History Main Topics  . Smoking status: Never Smoker   . Smokeless tobacco: Never Used  . Alcohol Use: No  . Drug Use: No  . Sexual Activity: Not on file   Other Topics Concern  . Not on file   Social History Narrative    Past Surgical History  Procedure Laterality Date  . Cardiac catheterization    . Tubal ligation    . Lithotripsy      for kidney stone  . Partial thyroidectomy for a growth      YRS AGO   . Robotic assisted total hysterectomy with bilateral salpingo oopherectomy Bilateral 04/24/2013    Procedure: ROBOTIC ASSISTED TOTAL HYSTERECTOMY WITH BILATERAL SALPINGO OOPHORECTOMY,  LYMPH NODE DISSECTION;   Surgeon: Imagene Gurney A. Alycia Rossetti, MD;  Location: [facility];  Service: Gynecology;  Laterality: Bilateral;  . Lymph node dissection N/A 04/24/2013    Procedure: LYMPH NODE DISSECTION;  Surgeon: Imagene Gurney A. Alycia Rossetti, MD;  Location: [facility];  Service: Gynecology;  Laterality: N/A;  . Abdominal hysterectomy    . Back surgery  1968    Ruptured disc repair    Family History  Problem Relation Age of Onset  . Arthritis Mother   . Colon cancer Neg Hx   . Cancer Sister     1 deceased from lung cancer  . Cancer Brother     2 deceased from lung cancer    Allergies  Allergen Reactions  . Dexamethasone Other (See Comments)    Mood disturbance, psychosis  . Rocephin [Ceftriaxone Sodium In Dextrose] Rash    Current Outpatient Prescriptions on File Prior to Visit  Medication Sig Dispense Refill  . acetaminophen (TYLENOL) 650 MG CR tablet Take 650 mg by mouth every 8 (eight) hours as needed for pain.    . Cholecalciferol (VITAMIN D-3) 1000 UNITS CAPS Take 1 capsule by mouth daily.    Marland Kitchen co-enzyme Q-10 30 MG capsule Take 30 mg by mouth daily.    Marland Kitchen Dextromethorphan-Guaifenesin (MUCINEX DM MAXIMUM STRENGTH) 60-1200 MG TB12 Take 1 tablet  by mouth 2 (two) times daily.    Marland Kitchen ibuprofen (ADVIL,MOTRIN) 200 MG tablet Take 200 mg by mouth every 6 (six) hours as needed.    Marland Kitchen losartan-hydrochlorothiazide (HYZAAR) 100-12.5 MG per tablet Take 1 tablet by mouth daily. 90 tablet 3  . magnesium 30 MG tablet Take 30 mg by mouth 2 (two) times daily.     No current facility-administered medications on file prior to visit.    BP 166/100 mmHg  Pulse 92  Temp(Src) 99.1 F (37.3 C) (Oral)  Resp 20  Ht 5\' 6"  (1.676 m)  Wt 211 lb (95.709 kg)  BMI 34.07 kg/m2  SpO2 98%      Review of Systems  Constitutional: Positive for fatigue.  HENT: Negative for congestion, dental problem, hearing loss, rhinorrhea, sinus pressure, sore throat and tinnitus.   Eyes: Negative for pain, discharge and visual disturbance.  Respiratory: Positive  for cough. Negative for shortness of breath.   Cardiovascular: Negative for chest pain, palpitations and leg swelling.  Gastrointestinal: Negative for nausea, vomiting, abdominal pain, diarrhea, constipation, blood in stool and abdominal distention.  Genitourinary: Negative for dysuria, urgency, frequency, hematuria, flank pain, vaginal bleeding, vaginal discharge, difficulty urinating, vaginal pain and pelvic pain.  Musculoskeletal: Positive for back pain. Negative for joint swelling, arthralgias and gait problem.  Skin: Negative for rash.  Neurological: Negative for dizziness, syncope, speech difficulty, weakness, numbness and headaches.  Hematological: Negative for adenopathy.  Psychiatric/Behavioral: Negative for behavioral problems, dysphoric mood and agitation. The patient is not nervous/anxious.        Objective:   Physical Exam  Constitutional: She is oriented to person, place, and time. She appears well-developed and well-nourished.  Repeat blood pressure 120/82  HENT:  Head: Normocephalic.  Right Ear: External ear normal.  Left Ear: External ear normal.  Mouth/Throat: Oropharynx is clear and moist.  Eyes: Conjunctivae and EOM are normal. Pupils are equal, round, and reactive to light.  Neck: Normal range of motion. Neck supple. No thyromegaly present.  Cardiovascular: Normal rate, regular rhythm, normal heart sounds and intact distal pulses.   Pulmonary/Chest: Effort normal. She has rales.  A few basilar crackles at both bases  O2 saturation 98%  Abdominal: Soft. Bowel sounds are normal. She exhibits no mass. There is no tenderness.  Musculoskeletal: Normal range of motion.  Lymphadenopathy:    She has no cervical adenopathy.  Neurological: She is alert and oriented to person, place, and time.  Skin: Skin is warm and dry. No rash noted.  Psychiatric: She has a normal mood and affect. Her behavior is normal.          Assessment & Plan:   Status post probable  community acquired pneumonia Hypertension, controlled.  Repeat blood pressure 120/82  Oncology follow-up CPX in October

## 2015-01-15 NOTE — Patient Instructions (Signed)
Limit your sodium (Salt) intake  Please check your blood pressure on a regular basis.  If it is consistently greater than 150/90, please make an office appointment.   

## 2015-01-15 NOTE — Progress Notes (Signed)
Pre visit review using our clinic review tool, if applicable. No additional management support is needed unless otherwise documented below in the visit note. 

## 2015-02-24 IMAGING — CR DG CHEST 1V PORT
1 series · 1 of 1 positions shown · non-contrast
Comparison: 04/20/2013

CLINICAL DATA: Generalized fatigue and weakness with fever.  1 day
post chemotherapy for endometrial carcinoma.

PORTABLE CHEST - 1 VIEW

[AP]
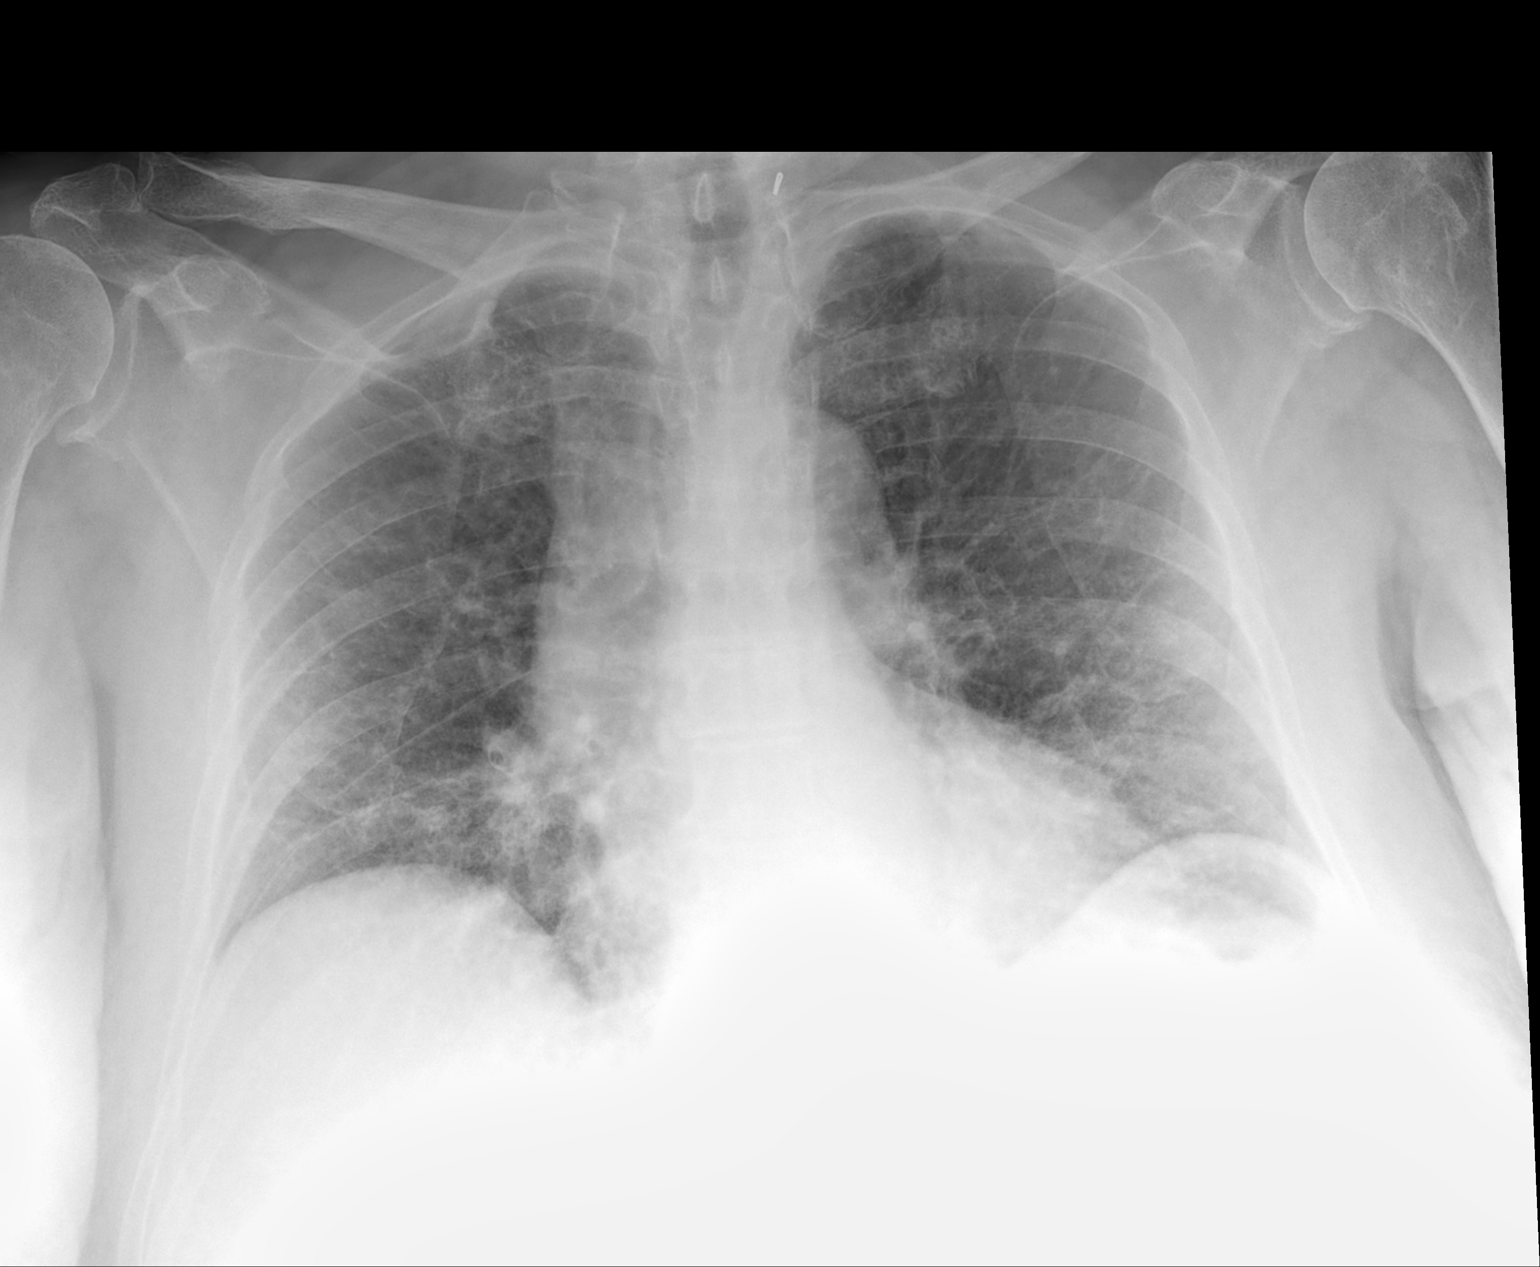

[1 of 1 positions shown; findings below may reference images not displayed]

FINDINGS: The shallow inspiration.  Heart size and pulmonary
vascularity are normal.  Peribronchial thickening and perihilar
streaky opacities consistent with chronic bronchitis.  No blunting
of costophrenic angles.  No pneumothorax.  No focal consolidation.
Surgical clip in the base of the neck.  Degenerative changes in the
spine and shoulders.  No significant change in the appearance of
chest since previous study.
IMPRESSION: Shallow inspiration with chronic bronchitic changes.  No evidence
of active pulmonary disease.

## 2015-03-15 IMAGING — CT CT HEAD W/O CM
1 of 2 series · 16 of 30 positions shown, 20 images · non-contrast
Comparison: None.

CLINICAL DATA: Head trauma secondary to a fall.  Abrasion to the
left side of forehead.

CT HEAD WITHOUT CONTRAST
TECHNIQUE: Contiguous axial images were obtained from the base of
the skull through the vertex without contrast.

[Series 2: headseq 4.8 h45s · axial · 0.50mm/px · z∈[+978,+1103]mm · 16 of 30 slices shown, 20 images]
[im 2/30  brain]
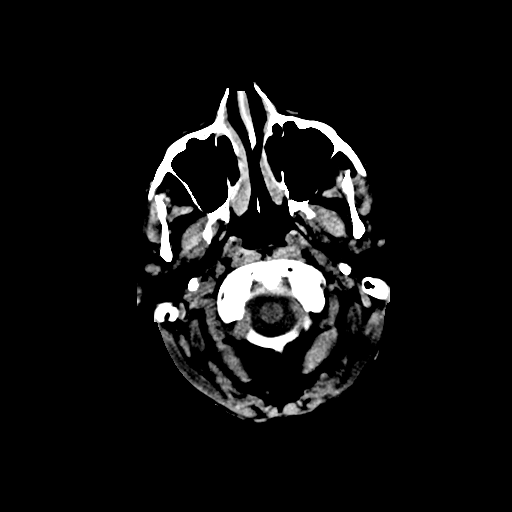
[im 2/30  bone]
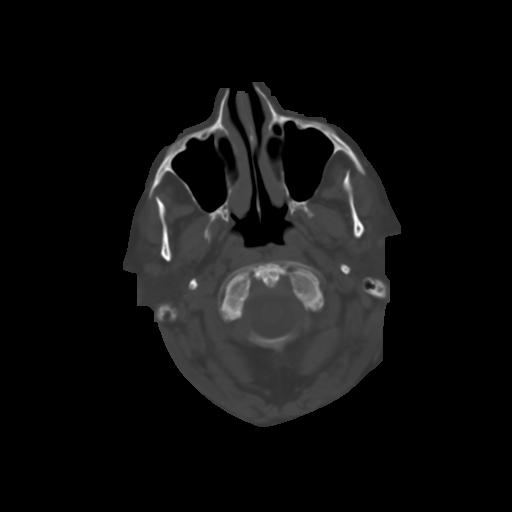
[im 4/30  brain]
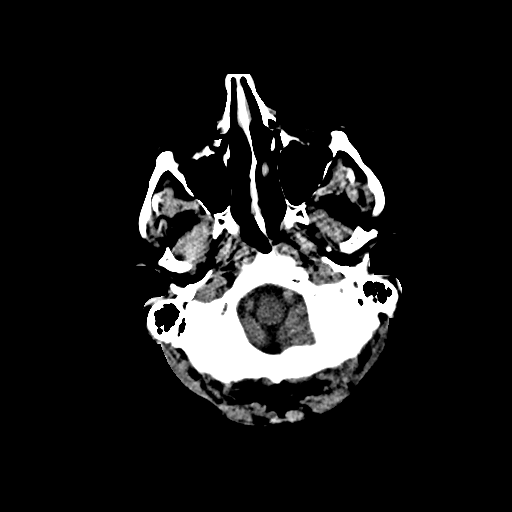
[im 5/30  brain]
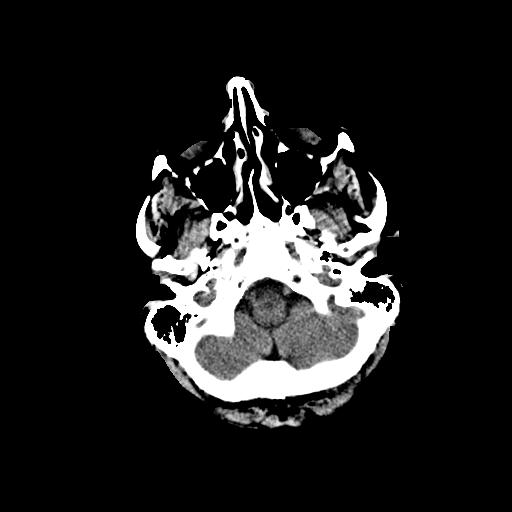
[im 8/30  brain]
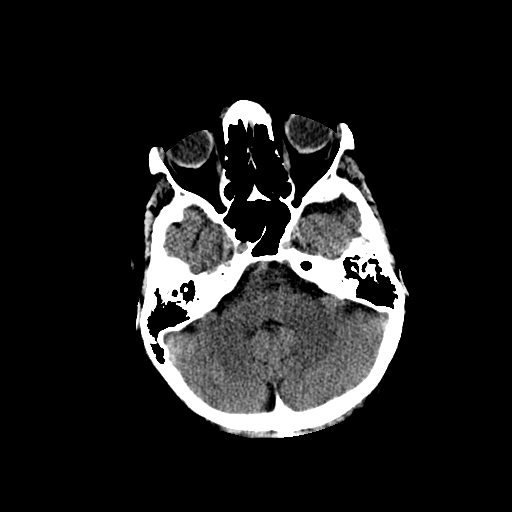
[im 9/30  brain]
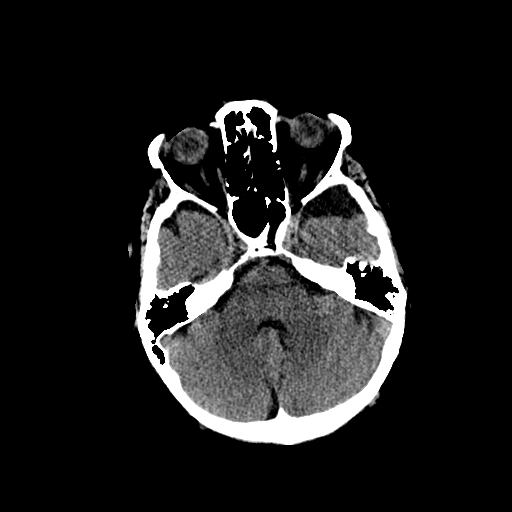
[im 9/30  bone]
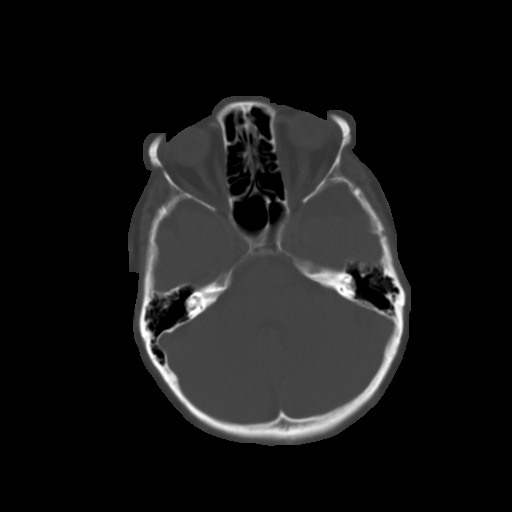
[im 10/30  brain]
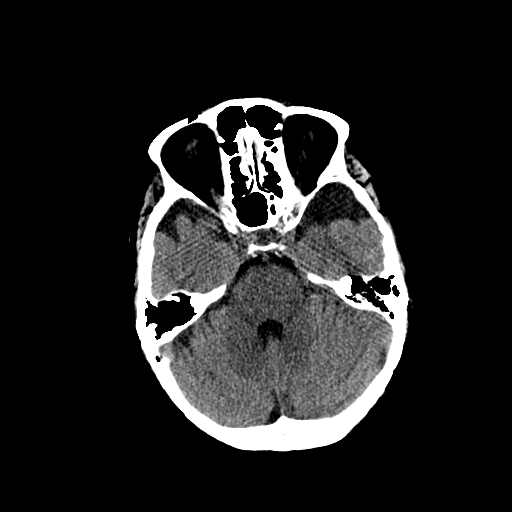
[im 13/30  brain]
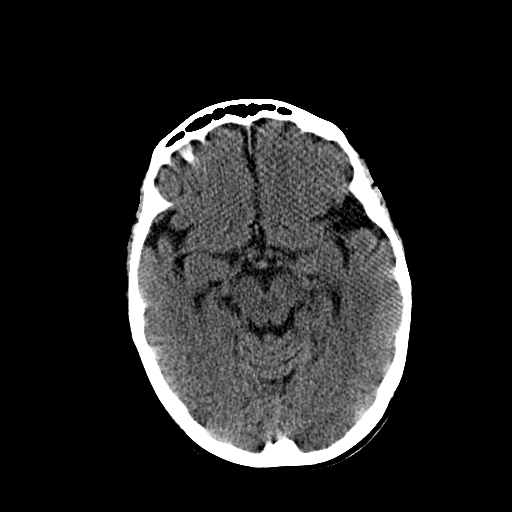
[im 14/30  brain]
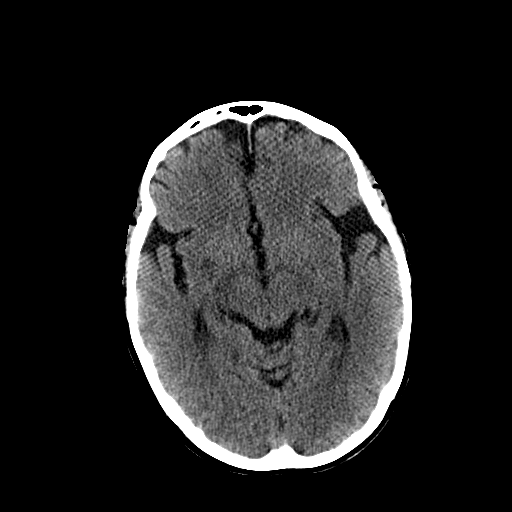
[im 16/30  brain]
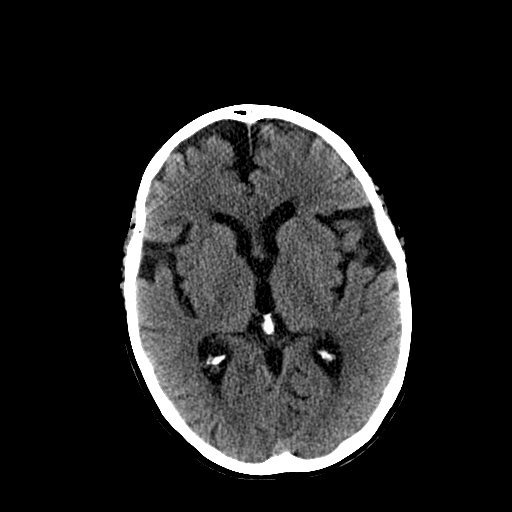
[im 16/30  bone]
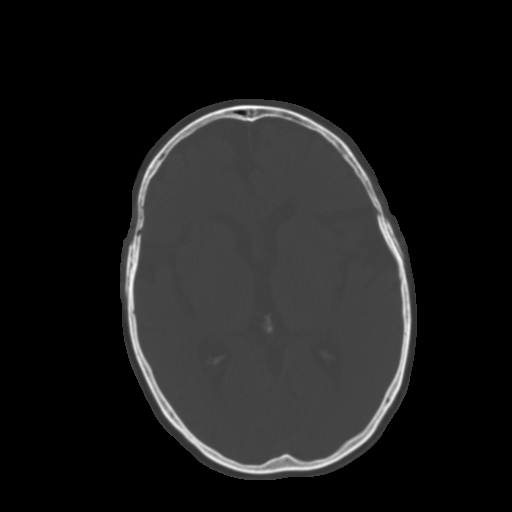
[im 17/30  brain]
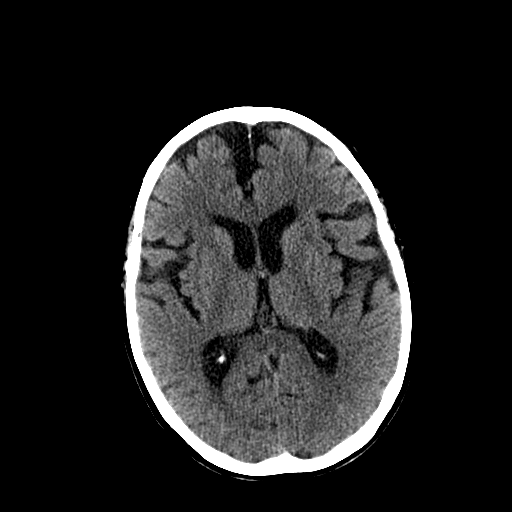
[im 20/30  brain]
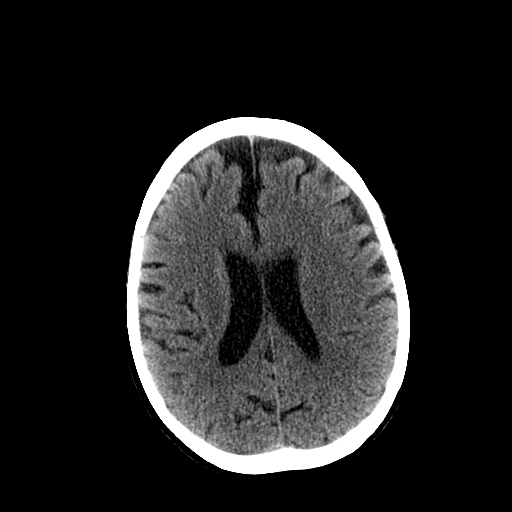
[im 21/30  brain]
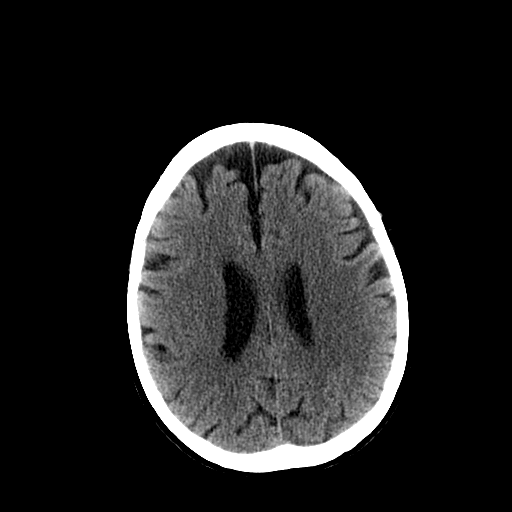
[im 22/30  brain]
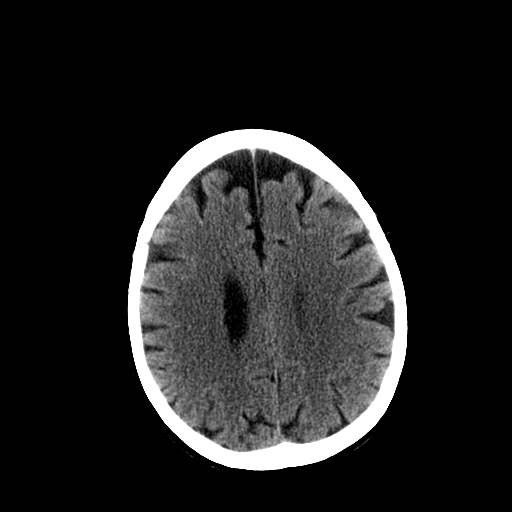
[im 22/30  bone]
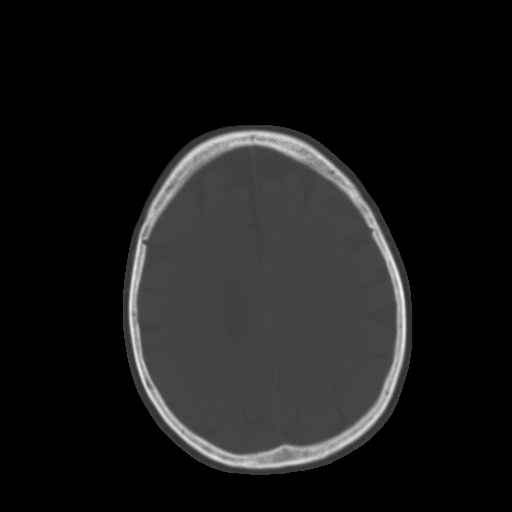
[im 25/30  brain]
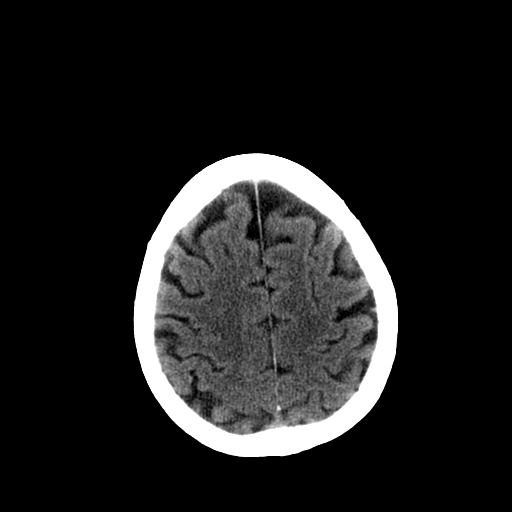
[im 26/30  brain]
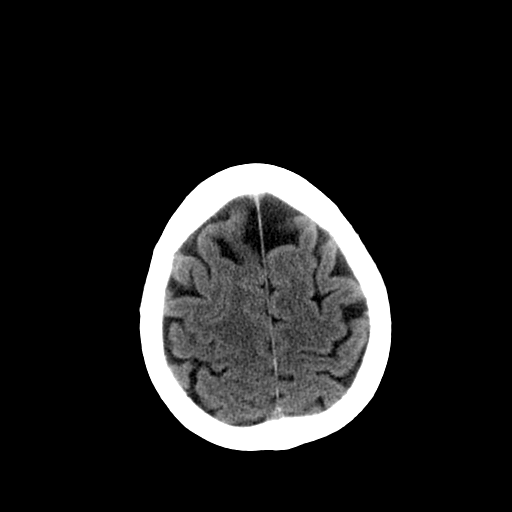
[im 28/30  brain]
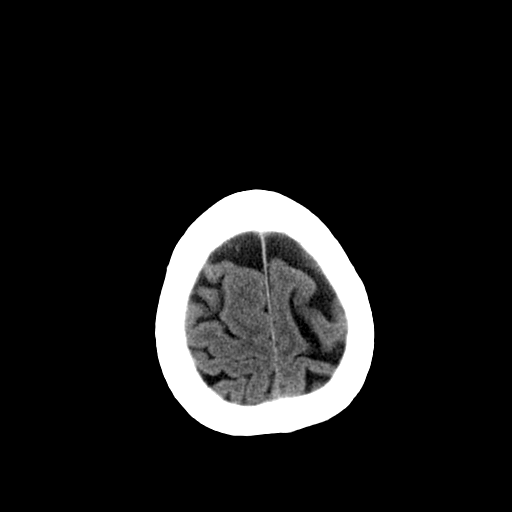

[16 of 30 positions shown; findings below may reference images not displayed]

FINDINGS: There is no acute intracranial hemorrhage or mass lesion.
There is fairly severe frontal and temporal lobe atrophy.  There is
a vague area of lucency low in the right basal ganglia which may
represent an old lacunar infarct.  Small areas of lucency in the
anterior limbs of both internal capsules consistent with small
vessel ischemic disease.
IMPRESSION: No acute intracranial abnormality.  Frontal and temporal lobe
atrophy.  Probable old right basal ganglia lacunar infarct.

## 2015-04-17 ENCOUNTER — Ambulatory Visit: Payer: Medicare Other | Admitting: Radiation Oncology

## 2015-04-30 ENCOUNTER — Telehealth: Payer: Self-pay | Admitting: Oncology

## 2015-04-30 NOTE — Telephone Encounter (Signed)
returned a call to [place] with an appointment  For her mom

## 2015-05-01 ENCOUNTER — Encounter: Payer: Self-pay | Admitting: Radiation Oncology

## 2015-05-01 ENCOUNTER — Ambulatory Visit
Admission: RE | Admit: 2015-05-01 | Discharge: 2015-05-01 | Disposition: A | Discharge status: Home / Self Care | Payer: Medicare Other | Source: Ambulatory Visit | Attending: Radiation Oncology | Admitting: Radiation Oncology

## 2015-05-01 ENCOUNTER — Other Ambulatory Visit (HOSPITAL_COMMUNITY)
Admission: RE | Admit: 2015-05-01 | Discharge: 2015-05-01 | Disposition: A | Discharge status: Home / Self Care | Payer: Medicare Other | Source: Ambulatory Visit | Attending: Radiation Oncology | Admitting: Radiation Oncology

## 2015-05-01 VITALS — BP 168/77 | HR 88 | Temp 98.3°F | Resp 16 | Ht 66.0 in | Wt 218.0 lb

## 2015-05-01 DIAGNOSIS — R87810 Cervical high risk human papillomavirus (HPV) DNA test positive: Secondary | ICD-10-CM | POA: Insufficient documentation

## 2015-05-01 DIAGNOSIS — C541 Malignant neoplasm of endometrium: Secondary | ICD-10-CM | POA: Diagnosis not present

## 2015-05-01 DIAGNOSIS — Z124 Encounter for screening for malignant neoplasm of cervix: Secondary | ICD-10-CM | POA: Diagnosis not present

## 2015-05-01 DIAGNOSIS — Z1151 Encounter for screening for human papillomavirus (HPV): Secondary | ICD-10-CM | POA: Diagnosis not present

## 2015-05-01 NOTE — Progress Notes (Signed)
Misty Alvarado here for follow up.  She denies pain but does have artrities in her knees.  She denies having any bladder or bowel issues.  She denies having vaginal bleeding or discharge.  She reports a good appetite.  She reports she is fatigued.  She is not using a vaginal dilator.  BP 168/77 mmHg  Pulse 88  Temp(Src) 98.3 F (36.8 C) (Oral)  Resp 16  Ht 5\' 6"  (1.676 m)  Wt 218 lb (98.884 kg)  BMI 35.20 kg/m2  SpO2 97%

## 2015-05-01 NOTE — Progress Notes (Signed)
  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 Misty Alvarado here for follow up. She denies pain but does have artrities in her knees. She denies having any bladder or bowel issues. She denies having vaginal bleeding or discharge. She reports a good appetite. She reports she is fatigued. She is not using a vaginal dilator. Denies pain/bleeding. No further problems with fecal incontinence. Accompanied by her daughter on evaluation today       ALLERGIES:  is allergic to dexamethasone and rocephin.  Meds: Current Outpatient Prescriptions  Medication Sig Dispense Refill  . acetaminophen (TYLENOL) 650 MG CR tablet Take 650 mg by mouth every 8 (eight) hours as needed for pain.    . Cholecalciferol (VITAMIN D-3) 1000 UNITS CAPS Take 1 capsule by mouth daily.    Marland Kitchen co-enzyme Q-10 30 MG capsule Take 30 mg by mouth daily.    Marland Kitchen Dextromethorphan-Guaifenesin (MUCINEX DM MAXIMUM STRENGTH) 60-1200 MG TB12 Take 1 tablet by mouth 2 (two) times daily.    Marland Kitchen ibuprofen (ADVIL,MOTRIN) 200 MG tablet Take 200 mg by mouth every 6 (six) hours as needed.    Marland Kitchen losartan-hydrochlorothiazide (HYZAAR) 100-12.5 MG per tablet Take 1 tablet by mouth daily. 90 tablet 3  . magnesium 30 MG tablet Take 30 mg by mouth 2 (two) times daily.     No current facility-administered medications for this encounter.    Physical Findings: The patient is in no acute distress. Patient is alert and oriented. BP 168/77 mmHg  Pulse 88  Temp(Src) 98.3 F (36.8 C) (Oral)  Resp 16  Ht 5\' 6"  (1.676 m)  Wt 218 lb (98.884 kg)  BMI 35.20 kg/m2   SpO2 97%. No palpable subclavicular or axillary adenopathy. The lungs are clear to auscultation. The heart has a regular rhythm and rate. The abdomen is soft and nontender with normal bowel sounds. No inguinal adenopathy appreciated. On pelvic examination the external genitalia are unremarkable. A speculum exam is performed. No mucosal lesions noted in the vaginal vault A Pap smear is obtained of  the proximal vagina. On bimanual and rectovaginal examination there no obvious pelvic masses appreciated.  Lab Findings: Lab Results  Component Value Date   WBC 3.9 03/25/2014   HGB 12.3 03/25/2014   HCT 35.4 03/25/2014   MCV 93.0 03/25/2014   PLT 238 03/25/2014    Radiographic Findings: No results found.  Impression:  No evidence for recurrence on clinical exam today, Pap smear pending  Plan:  Routine followup in 6 months. In the interim the patient will be seen by gynecologic oncology.  Appt. on May 26, 2015 Dr. Curlene Dolphin.  This document serves as a record of services personally performed by Gery Pray, MD. It was created on his behalf by Jeralene Peters, a trained medical scribe. The creation of this record is based on the scribe's personal observations and the provider's statements to them. This document has been checked and approved by the attending provider.       ____________________________________ Blair Promise, MD

## 2015-05-05 LAB — CYTOLOGY - PAP

## 2015-05-08 ENCOUNTER — Telehealth: Payer: Self-pay | Admitting: Nurse Practitioner

## 2015-05-08 NOTE — Telephone Encounter (Signed)
Per Joylene John, NP, patient's daughter informed PAP shows abnormal cells and we would like to take a closer look. Per NP, patient scheduled for f/u with Dr. Fermin Schwab Friday 05/09/15 at 1:15. Daughter verbalizes understanding of results and next apt.

## 2015-05-09 ENCOUNTER — Ambulatory Visit: Payer: Medicare Other | Attending: Gynecology | Admitting: Gynecology

## 2015-05-09 ENCOUNTER — Encounter: Payer: Self-pay | Admitting: Gynecology

## 2015-05-09 VITALS — BP 125/77 | HR 100 | Temp 98.4°F | Resp 18 | Ht 66.0 in | Wt 213.8 lb

## 2015-05-09 DIAGNOSIS — R87629 Unspecified abnormal cytological findings in specimens from vagina: Secondary | ICD-10-CM

## 2015-05-09 DIAGNOSIS — Z8542 Personal history of malignant neoplasm of other parts of uterus: Secondary | ICD-10-CM | POA: Diagnosis not present

## 2015-05-09 DIAGNOSIS — R87620 Atypical squamous cells of undetermined significance on cytologic smear of vagina (ASC-US): Secondary | ICD-10-CM | POA: Diagnosis not present

## 2015-05-09 DIAGNOSIS — C541 Malignant neoplasm of endometrium: Secondary | ICD-10-CM

## 2015-05-09 DIAGNOSIS — G629 Polyneuropathy, unspecified: Secondary | ICD-10-CM | POA: Insufficient documentation

## 2015-05-09 DIAGNOSIS — Z923 Personal history of irradiation: Secondary | ICD-10-CM | POA: Diagnosis not present

## 2015-05-09 DIAGNOSIS — Z90710 Acquired absence of both cervix and uterus: Secondary | ICD-10-CM

## 2015-05-09 DIAGNOSIS — Z9221 Personal history of antineoplastic chemotherapy: Secondary | ICD-10-CM | POA: Diagnosis not present

## 2015-05-09 DIAGNOSIS — Z08 Encounter for follow-up examination after completed treatment for malignant neoplasm: Secondary | ICD-10-CM | POA: Diagnosis not present

## 2015-05-09 DIAGNOSIS — I10 Essential (primary) hypertension: Secondary | ICD-10-CM | POA: Diagnosis not present

## 2015-05-09 DIAGNOSIS — Z90722 Acquired absence of ovaries, bilateral: Secondary | ICD-10-CM | POA: Diagnosis not present

## 2015-05-09 NOTE — Patient Instructions (Signed)
Plan to follow up with Dr. Fermin Schwab in 18 months and Dr. Marko Plume and Sondra Come in between so you will be seeing someone every six months.  Please call for any questions or concerns.

## 2015-05-09 NOTE — Progress Notes (Signed)
Consult Note: Gyn-Onc   RAVON MCILHENNY 81 y.o. female  No chief complaint on file.   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 Recent ASCUS Pap smear. Plan:  Colposcopy was performed and no abnormalities were noted except for some radiation changes. The patient and her daughter reassured regarding these findings. At this juncture 2 years following her primary surgery we would lengthen the visits to 6 month intervals. The patient will see Dr. Marko Plume and Sondra Come and return to see me in 18 months.  Interval history. The patient returns today for continuing followup. She comes accompanied by her niece.  She recently saw Dr.Kinard who obtained a Pap smear. That returned showing ASCUS. She presents today for colposcopy. She denies any pelvic symptoms bleeding or discharge. She also has peripheral neuropathy in her feet being treated with vitamin B 6 and Neurontin.. She denies any nausea vomiting and has no other GI or GU symptoms.  HPI: Patient initially presented with postmenopausal bleeding. A biopsy showed endometrial cancer and she underwent a robotic assisted hysterectomy, BSO, and pelvic lymphadenectomy on Apr 24 2013. Final pathology showed a stage I a papillary serous endometrial carcinoma with lymph vascular invasion.  Adjuvant chemotherapy with carboplatin and Taxol as well as vaginal vault brachytherapy was completed. Following chemotherapy she has some peripheral neuropathy in her feet.  Review of Systems:10 point review of systems is negative except as noted in interval history.   Vitals: Blood pressure 125/77, pulse 100, temperature 98.4 F (36.9 C), temperature source Oral, resp. rate 18, height 5\' 6"  (1.676 m), weight 213 lb 12.8 oz (96.979 kg), SpO2 100 %.  Physical Exam: General : The patient is a healthy woman in no acute distress. She has alopecia. HEENT: normocephalic, extraoccular movements normal; neck is supple  without thyromegally  Lynphnodes: Supraclavicular and inguinal nodes not enlarged  Abdomen: Soft, non-tender, no ascites, no organomegally, no masses, no hernias all incisions are healing well Pelvic:  EGBUS vagina bladder urethra are normal  Vagina is slightly atrophic and has radiation changes. No lesions are noted.  Cervix and uterus are surgically absent  Bimanual rectovaginal exam revealed no masses induration or nodularity.   Lower extremities: No edema or varicosities. Normal range of motion   Procedure note: Colposcopy is performed of the entire vagina using acetic acid and green filter. No lesions are noted.     Allergies  Allergen Reactions  . Dexamethasone Other (See Comments)    Mood disturbance, psychosis  . Rocephin [Ceftriaxone Sodium In Dextrose] Rash    Past Medical History  Diagnosis Date  . ALLERGIC RHINITIS 10/03/2007  . HYPERLIPIDEMIA 10/03/2007  . HYPERTENSION 10/03/2007  . Ulcer     gastric  . GERD (gastroesophageal reflux disease)     "ONCE IN A WHILE"  TUMS IF NEEDED  . Arthritis     KNEES  . Endometrial cancer     new diagnosis 03/02/13  . History of radiation therapy 10/22/13, 10/29/13, 11/05/13, 11/12/13, 11/19/13    proximal vagina 30 gray    Past Surgical History  Procedure Laterality Date  . Cardiac catheterization    . Tubal ligation    . Lithotripsy      for kidney stone  . Partial thyroidectomy for a growth      YRS AGO   . Robotic assisted total hysterectomy with bilateral salpingo oopherectomy Bilateral 04/24/2013    Procedure: ROBOTIC ASSISTED TOTAL HYSTERECTOMY WITH BILATERAL SALPINGO OOPHORECTOMY,  LYMPH NODE DISSECTION;  Surgeon: Lucita Lora. Alycia Rossetti, MD;  Location: [facility];  Service: Gynecology;  Laterality: Bilateral;  . Lymph node dissection N/A 04/24/2013    Procedure: LYMPH NODE DISSECTION;  Surgeon: Imagene Gurney A. Alycia Rossetti, MD;  Location: [facility];  Service: Gynecology;  Laterality: N/A;  . Abdominal hysterectomy    . Back surgery   1968    Ruptured disc repair    Current Outpatient Prescriptions  Medication Sig Dispense Refill  . acetaminophen (TYLENOL) 650 MG CR tablet Take 650 mg by mouth every 8 (eight) hours as needed for pain.    . beta carotene w/minerals (OCUVITE) tablet Take 1 tablet by mouth daily.    . Cholecalciferol (VITAMIN D-3) 1000 UNITS CAPS Take 1 capsule by mouth daily.    Marland Kitchen co-enzyme Q-10 30 MG capsule Take 30 mg by mouth daily.    Marland Kitchen Dextromethorphan-Guaifenesin (MUCINEX DM MAXIMUM STRENGTH) 60-1200 MG TB12 Take 1 tablet by mouth 2 (two) times daily.    Marland Kitchen ibuprofen (ADVIL,MOTRIN) 200 MG tablet Take 200 mg by mouth every 6 (six) hours as needed.    Marland Kitchen losartan-hydrochlorothiazide (HYZAAR) 100-12.5 MG per tablet Take 1 tablet by mouth daily. 90 tablet 3  . magnesium 30 MG tablet Take 30 mg by mouth 2 (two) times daily.    Marland Kitchen pyridOXINE (VITAMIN B-6) 100 MG tablet Take 100 mg by mouth daily.    . vitamin B-12 (CYANOCOBALAMIN) 1000 MCG tablet Take 1,000 mcg by mouth daily.     No current facility-administered medications for this visit.    History   Social History  . Marital Status: Widowed    Spouse Name: N/A  . Number of Children: N/A  . Years of Education: N/A   Occupational History  . Not on file.   Social History Main Topics  . Smoking status: Never Smoker   . Smokeless tobacco: Never Used  . Alcohol Use: No  . Drug Use: No  . Sexual Activity: Not on file   Other Topics Concern  . Not on file   Social History Narrative    Family History  Problem Relation Age of Onset  . Arthritis Mother   . Colon cancer Neg Hx   . Cancer Sister     1 deceased from lung cancer  . Cancer Brother     2 deceased from lung cancer      CLARKE-PEARSON,Martha Soltys L, MD 05/09/2015, 1:11 PM

## 2015-05-25 ENCOUNTER — Other Ambulatory Visit: Payer: Self-pay | Admitting: Oncology

## 2015-05-25 DIAGNOSIS — C541 Malignant neoplasm of endometrium: Secondary | ICD-10-CM

## 2015-05-26 ENCOUNTER — Telehealth: Payer: Self-pay | Admitting: Oncology

## 2015-05-26 ENCOUNTER — Other Ambulatory Visit: Payer: Self-pay

## 2015-05-26 ENCOUNTER — Ambulatory Visit: Payer: Self-pay | Admitting: Oncology

## 2015-05-26 NOTE — Telephone Encounter (Signed)
Per inbox message,patients daughter is aware of below: Patient or daughter cancelled MD today, which was fine as she just saw Dr Josephina Shih on 5-27, doing well and he recommended alternating visits med onc/ rad onc/gyn onc every 6 months. He plans to see her in 18 months on this schedule.  She does NOT have PAC   She is to see Dr Sondra Come in 10-2015   (she is also followed by PCP)   Please make her next appointment to [facility] in 04-2016 + lab.  Please be sure patient/daughter are aware of Dr Clabe Seal apt when RN or scheduler speaks to her.   Thank you  Lennis

## 2015-05-28 ENCOUNTER — Telehealth: Payer: Self-pay

## 2015-05-28 NOTE — Telephone Encounter (Signed)
-----   Message from Gordy Levan, MD sent at 05/26/2015  9:47 AM EDT ----- Patient or daughter cancelled MD today, which was fine as she just saw Dr Josephina Shih on 5-27, doing well and he recommended alternating visits med onc/ rad onc/gyn onc every 6 months. He plans to see her in 18 months on this schedule. She does NOT have PAC  She is to see Dr Sondra Come in 10-2015  (she is also followed by PCP)  Please make her next appointment to [facility] in 04-2016 + lab. Please be sure patient/daughter are aware of Dr Clabe Seal apt when RN or scheduler speaks to her.  Thank you Lennis

## 2015-05-28 NOTE — Telephone Encounter (Signed)
Spoke with daughter Bonnita Nasuti and she is aware of Dr. Lois Huxley appointment 10-23-15. Told her to call in January 2017 to be sure appointment  With Dr. Marko Plume is set up for May 2017.  Bonnita Nasuti verbalized understanding.

## 2015-06-09 ENCOUNTER — Other Ambulatory Visit: Payer: Self-pay

## 2015-06-23 ENCOUNTER — Ambulatory Visit: Payer: Self-pay | Admitting: Oncology

## 2015-06-23 ENCOUNTER — Other Ambulatory Visit: Payer: Self-pay

## 2015-08-07 ENCOUNTER — Encounter: Payer: Self-pay | Admitting: Internal Medicine

## 2015-08-07 ENCOUNTER — Ambulatory Visit (INDEPENDENT_AMBULATORY_CARE_PROVIDER_SITE_OTHER): Payer: Medicare Other | Admitting: Internal Medicine

## 2015-08-07 ENCOUNTER — Other Ambulatory Visit: Payer: Self-pay | Admitting: *Deleted

## 2015-08-07 VITALS — BP 150/90 | HR 110 | Temp 99.7°F | Resp 22 | Ht 66.0 in | Wt 213.0 lb

## 2015-08-07 DIAGNOSIS — C541 Malignant neoplasm of endometrium: Secondary | ICD-10-CM

## 2015-08-07 DIAGNOSIS — M549 Dorsalgia, unspecified: Secondary | ICD-10-CM

## 2015-08-07 DIAGNOSIS — I10 Essential (primary) hypertension: Secondary | ICD-10-CM | POA: Diagnosis not present

## 2015-08-07 DIAGNOSIS — F0390 Unspecified dementia without behavioral disturbance: Secondary | ICD-10-CM | POA: Insufficient documentation

## 2015-08-07 LAB — POCT URINALYSIS DIPSTICK
GLUCOSE UA: NEGATIVE
Ketones, UA: NEGATIVE
NITRITE UA: NEGATIVE
Protein, UA: NEGATIVE
RBC UA: NEGATIVE
Spec Grav, UA: 1.02
Urobilinogen, UA: 0.2
pH, UA: 6

## 2015-08-07 MED ORDER — TRAMADOL HCL 50 MG PO TABS: 50.0000 mg | ORAL_TABLET | Freq: Four times a day (QID) | ORAL | Status: DC | PRN

## 2015-08-07 MED ORDER — DONEPEZIL HCL 5 MG PO TABS: 5.0000 mg | ORAL_TABLET | Freq: Every day | ORAL | Status: DC

## 2015-08-07 NOTE — Progress Notes (Signed)
Subjective:    Patient ID: Misty Alvarado, female    DOB: 04-10-1933, 81 y.o.   MRN: [id number]  HPI  81 year old patient who presents with a 3-4 day history of low back pain. She is followed closely.  Oncology with a history of endometrial cancer diagnosed in 2014.  She is status post surgery, chemotherapy and RT. Over the past 2 years.  There is been some gradual memory decline.  The patient's daughter was told by oncology.  This might be a bit of a problem. She has treated hypertension and a history of dyslipidemia.  MMSE today 18/30  Past Medical History  Diagnosis Date  . ALLERGIC RHINITIS 10/03/2007  . HYPERLIPIDEMIA 10/03/2007  . HYPERTENSION 10/03/2007  . Ulcer     gastric  . GERD (gastroesophageal reflux disease)     "ONCE IN A WHILE"  TUMS IF NEEDED  . Arthritis     KNEES  . Endometrial cancer     new diagnosis 03/02/13  . History of radiation therapy 10/22/13, 10/29/13, 11/05/13, 11/12/13, 11/19/13    proximal vagina 30 gray    Social History   Social History  . Marital Status: Widowed    Spouse Name: N/A  . Number of Children: N/A  . Years of Education: N/A   Occupational History  . Not on file.   Social History Main Topics  . Smoking status: Never Smoker   . Smokeless tobacco: Never Used  . Alcohol Use: No  . Drug Use: No  . Sexual Activity: Not on file   Other Topics Concern  . Not on file   Social History Narrative    Past Surgical History  Procedure Laterality Date  . Cardiac catheterization    . Tubal ligation    . Lithotripsy      for kidney stone  . Partial thyroidectomy for a growth      YRS AGO   . Robotic assisted total hysterectomy with bilateral salpingo oopherectomy Bilateral 04/24/2013    Procedure: ROBOTIC ASSISTED TOTAL HYSTERECTOMY WITH BILATERAL SALPINGO OOPHORECTOMY,  LYMPH NODE DISSECTION;  Surgeon: Imagene Gurney A. Alycia Rossetti, MD;  Location: [facility];  Service: Gynecology;  Laterality: Bilateral;  . Lymph node dissection N/A 04/24/2013     Procedure: LYMPH NODE DISSECTION;  Surgeon: Imagene Gurney A. Alycia Rossetti, MD;  Location: [facility];  Service: Gynecology;  Laterality: N/A;  . Abdominal hysterectomy    . Back surgery  1968    Ruptured disc repair    Family History  Problem Relation Age of Onset  . Arthritis Mother   . Colon cancer Neg Hx   . Cancer Sister     1 deceased from lung cancer  . Cancer Brother     2 deceased from lung cancer    Allergies  Allergen Reactions  . Dexamethasone Other (See Comments)    Mood disturbance, psychosis  . Rocephin [Ceftriaxone Sodium In Dextrose] Rash    Current Outpatient Prescriptions on File Prior to Visit  Medication Sig Dispense Refill  . acetaminophen (TYLENOL) 650 MG CR tablet Take 650 mg by mouth every 8 (eight) hours as needed for pain.    . beta carotene w/minerals (OCUVITE) tablet Take 1 tablet by mouth daily.    . Cholecalciferol (VITAMIN D-3) 1000 UNITS CAPS Take 1 capsule by mouth daily.    Marland Kitchen co-enzyme Q-10 30 MG capsule Take 30 mg by mouth daily.    Marland Kitchen ibuprofen (ADVIL,MOTRIN) 200 MG tablet Take 200 mg by mouth every 6 (six) hours as needed.    Marland Kitchen  losartan-hydrochlorothiazide (HYZAAR) 100-12.5 MG per tablet Take 1 tablet by mouth daily. 90 tablet 3  . magnesium 30 MG tablet Take 30 mg by mouth daily.     Marland Kitchen pyridOXINE (VITAMIN B-6) 100 MG tablet Take 100 mg by mouth daily.    . vitamin B-12 (CYANOCOBALAMIN) 1000 MCG tablet Take 1,000 mcg by mouth daily.     No current facility-administered medications on file prior to visit.    BP 150/90 mmHg  Pulse 110  Temp(Src) 99.7 F (37.6 C) (Oral)  Resp 22  Ht 5\' 6"  (1.676 m)  Wt 213 lb (96.616 kg)  BMI 34.40 kg/m2  SpO2 98%     Review of Systems  Constitutional: Negative.   HENT: Negative for congestion, dental problem, hearing loss, rhinorrhea, sinus pressure, sore throat and tinnitus.   Eyes: Negative for pain, discharge and visual disturbance.  Respiratory: Negative for cough and shortness of breath.     Cardiovascular: Negative for chest pain, palpitations and leg swelling.  Gastrointestinal: Negative for nausea, vomiting, abdominal pain, diarrhea, constipation, blood in stool and abdominal distention.  Genitourinary: Negative for dysuria, urgency, frequency, hematuria, flank pain, vaginal bleeding, vaginal discharge, difficulty urinating, vaginal pain and pelvic pain.  Musculoskeletal: Positive for back pain. Negative for joint swelling, arthralgias and gait problem.  Skin: Negative for rash.  Neurological: Negative for dizziness, syncope, speech difficulty, weakness, numbness and headaches.  Hematological: Negative for adenopathy.  Psychiatric/Behavioral: Positive for confusion. Negative for behavioral problems, dysphoric mood and agitation. The patient is not nervous/anxious.        Objective:   Physical Exam  Constitutional: She is oriented to person, place, and time. She appears well-developed and well-nourished.  HENT:  Head: Normocephalic.  Right Ear: External ear normal.  Left Ear: External ear normal.  Mouth/Throat: Oropharynx is clear and moist.  Eyes: Conjunctivae and EOM are normal. Pupils are equal, round, and reactive to light.  Neck: Normal range of motion. Neck supple. No thyromegaly present.  Cardiovascular: Normal rate, regular rhythm, normal heart sounds and intact distal pulses.   Pulmonary/Chest: Effort normal and breath sounds normal.  Abdominal: Soft. Bowel sounds are normal. She exhibits no mass. There is no tenderness.  Musculoskeletal: Normal range of motion.  Tight, tense musculature in the right lumbar region  Negative straight leg test  Lymphadenopathy:    She has no cervical adenopathy.  Neurological: She is alert and oriented to person, place, and time.  Skin: Skin is warm and dry. No rash noted.  Psychiatric: She has a normal mood and affect. Her behavior is normal.          Assessment & Plan:   Moderate severe cognitive dysfunction.   Options discussed.  Will give a trial of Aricept. Essential hypertension, stable.  Repeat blood pressure 120/70 History of endometrial cancer Low back pain.  Patient has a history of intolerance to steroids and remote history of significant upper GI bleeding related to anti-inflammatory medications.  Will give a trial of Ultram and continue Tylenol when necessary  Recheck 3 months

## 2015-08-07 NOTE — Progress Notes (Signed)
Pre visit review using our clinic review tool, if applicable. No additional management support is needed unless otherwise documented below in the visit note. 

## 2015-08-07 NOTE — Progress Notes (Signed)
   Subjective:    Patient ID: Misty Alvarado, female    DOB: 05-21-1933, 81 y.o.   MRN: [id number]  HPI  BP Readings from Last 3 Encounters:  08/07/15 150/90  05/09/15 125/77  05/01/15 168/77     Review of Systems     Objective:   Physical Exam        Assessment & Plan:

## 2015-08-07 NOTE — Patient Instructions (Addendum)
Limit your sodium (Salt) intake  Most patients with low back pain will improve with time over the next two to 6 weeks.  Keep active but avoid any activities that cause pain.  Apply moist heat to the low back area several times daily.  Back Injury Prevention The following tips can help you to prevent a back injury. PHYSICAL FITNESS  Exercise often. Try to develop strong stomach (abdominal) muscles.  Do aerobic exercises often. This includes walking, jogging, biking, swimming.  Do exercises that help with balance and strength often. This includes tai chi and yoga.  Stretch before and after you exercise.  Keep a healthy weight. DIET   Ask your doctor how much calcium and vitamin D you need every day.  Include calcium in your diet. Foods high in calcium include dairy products; green, leafy vegetables; and products with calcium added (fortified).  Include vitamin D in your diet. Foods high in vitamin D include milk and products with vitamin D added.  Think about taking a multivitamin or other nutritional products called " supplements."  Stop smoking if you smoke. POSTURE   Sit and stand up straight. Avoid leaning forward or hunching over.  Choose chairs that support your lower back.  If you work at a desk:  Sit close to your work so you do not lean over.  Keep your chin tucked in.  Keep your neck drawn back.  Keep your elbows bent at a right angle. Your arms should look like the letter "L."  Sit high and close to the steering wheel when you drive. Add low back support to your car seat if needed.  Avoid sitting or standing in one position for too long. Get up and move around every hour. Take breaks if you are driving for a long time.  Sleep on your side with your knees slightly bent. You can also sleep on your back with a pillow under your knees. Do not sleep on your stomach. LIFTING, TWISTING, AND REACHING  Avoid heavy lifting, especially lifting over and over again. If  you must do heavy lifting:  Stretch before lifting.  Work slowly.  Rest between lifts.  Use carts and dollies to move objects when possible.  Make several small trips instead of carrying 1 heavy load.  Ask for help when you need it.  Ask for help when moving big, awkward objects.  Follow these steps when lifting:  Stand with your feet shoulder-width apart.  Get as close to the object as you can. Do not pick up heavy objects that are far from your body.  Use handles or lifting straps when possible.  Bend at your knees. Squat down, but keep your heels off the floor.  Keep your shoulders back, your chin tucked in, and your back straight.  Lift the object slowly. Tighten the muscles in your legs, stomach, and butt. Keep the object as close to the center of your body as possible.  Reverse these directions when you put a load down.  Do not:  Lift the object above your waist.  Twist at the waist while lifting or carrying a load. Move your feet if you need to turn, not your waist.  Bend over without bending at your knees.  Avoid reaching over your head, across a table, or for an object on a high surface. OTHER TIPS  Avoid wet floors and keep sidewalks clear of ice.  Do not sleep on a mattress that is too soft or too hard.  Keep items  that you use often within easy reach.  Put heavier objects on shelves at waist level. Put lighter objects on lower or higher shelves.  Find ways to lessen your stress. You can try exercise, massage, or relaxation.  Get help for depression or anxiety if needed. GET HELP IF:  You injure your back.  You have questions about diet, exercise, or other ways to prevent back injuries. MAKE SURE YOU:  Understand these instructions.  Will watch your condition.  Will get help right away if you are not doing well or get worse. Document Released: 05/17/2008 Document Revised: 02/21/2012 Document Reviewed: 01/10/2012 [facility] Patient  Information 2015 [place], [place]. This information is not intended to replace advice given to you by your health care provider. Make sure you discuss any questions you have with your health care provider. Low Back Strain with Rehab A strain is an injury in which a tendon or muscle is torn. The muscles and tendons of the lower back are vulnerable to strains. However, these muscles and tendons are very strong and require a great force to be injured. Strains are classified into three categories. Grade 1 strains cause pain, but the tendon is not lengthened. Grade 2 strains include a lengthened ligament, due to the ligament being stretched or partially ruptured. With grade 2 strains there is still function, although the function may be decreased. Grade 3 strains involve a complete tear of the tendon or muscle, and function is usually impaired. SYMPTOMS   Pain in the lower back.  Pain that affects one side more than the other.  Pain that gets worse with movement and may be felt in the hip, buttocks, or back of the thigh.  Muscle spasms of the muscles in the back.  Swelling along the muscles of the back.  Loss of strength of the back muscles.  Crackling sound (crepitation) when the muscles are touched. CAUSES  Lower back strains occur when a force is placed on the muscles or tendons that is greater than they can handle. Common causes of injury include:  Prolonged overuse of the muscle-tendon units in the lower back, usually from incorrect posture.  A single violent injury or force applied to the back. RISK INCREASES WITH:  Sports that involve twisting forces on the spine or a lot of bending at the waist (football, rugby, weightlifting, bowling, golf, tennis, speed skating, racquetball, swimming, running, gymnastics, diving).  Poor strength and flexibility.  Failure to warm up properly before activity.  Family history of lower back pain or disk disorders.  Previous back injury or surgery  (especially fusion).  Poor posture with lifting, especially heavy objects.  Prolonged sitting, especially with poor posture. PREVENTION   Learn and use proper posture when sitting or lifting (maintain proper posture when sitting, lift using the knees and legs, not at the waist).  Warm up and stretch properly before activity.  Allow for adequate recovery between workouts.  Maintain physical fitness:  Strength, flexibility, and endurance.  Cardiovascular fitness. PROGNOSIS  If treated properly, lower back strains usually heal within 6 weeks. RELATED COMPLICATIONS   Recurring symptoms, resulting in a chronic problem.  Chronic inflammation, scarring, and partial muscle-tendon tear.  Delayed healing or resolution of symptoms.  Prolonged disability. TREATMENT  Treatment first involves the use of ice and medicine, to reduce pain and inflammation. The use of strengthening and stretching exercises may help reduce pain with activity. These exercises may be performed at home or with a therapist. Severe injuries may require referral to a  therapist for further evaluation and treatment, such as ultrasound. Your caregiver may advise that you wear a back brace or corset, to help reduce pain and discomfort. Often, prolonged bed rest results in greater harm then benefit. Corticosteroid injections may be recommended. However, these should be reserved for the most serious cases. It is important to avoid using your back when lifting objects. At night, sleep on your back on a firm mattress with a pillow placed under your knees. If non-surgical treatment is unsuccessful, surgery may be needed.  MEDICATION   If pain medicine is needed, nonsteroidal anti-inflammatory medicines (aspirin and ibuprofen), or other minor pain relievers (acetaminophen), are often advised.  Do not take pain medicine for 7 days before surgery.  Prescription pain relievers may be given, if your caregiver thinks they are needed.  Use only as directed and only as much as you need.  Ointments applied to the skin may be helpful.  Corticosteroid injections may be given by your caregiver. These injections should be reserved for the most serious cases, because they may only be given a certain number of times. HEAT AND COLD  Cold treatment (icing) should be applied for 10 to 15 minutes every 2 to 3 hours for inflammation and pain, and immediately after activity that aggravates your symptoms. Use ice packs or an ice massage.  Heat treatment may be used before performing stretching and strengthening activities prescribed by your caregiver, physical therapist, or athletic trainer. Use a heat pack or a warm water soak. SEEK MEDICAL CARE IF:   Symptoms get worse or do not improve in 2 to 4 weeks, despite treatment.  You develop numbness, weakness, or loss of bowel or bladder function.  New, unexplained symptoms develop. (Drugs used in treatment may produce side effects.) EXERCISES  RANGE OF MOTION (ROM) AND STRETCHING EXERCISES - Low Back Strain Most people with lower back pain will find that their symptoms get worse with excessive bending forward (flexion) or arching at the lower back (extension). The exercises which will help resolve your symptoms will focus on the opposite motion.  Your physician, physical therapist or athletic trainer will help you determine which exercises will be most helpful to resolve your lower back pain. Do not complete any exercises without first consulting with your caregiver. Discontinue any exercises which make your symptoms worse until you speak to your caregiver.  If you have pain, numbness or tingling which travels down into your buttocks, leg or foot, the goal of the therapy is for these symptoms to move closer to your back and eventually resolve. Sometimes, these leg symptoms will get better, but your lower back pain may worsen. This is typically an indication of progress in your rehabilitation.  Be very alert to any changes in your symptoms and the activities in which you participated in the 24 hours prior to the change. Sharing this information with your caregiver will allow him/her to most efficiently treat your condition.  These exercises may help you when beginning to rehabilitate your injury. Your symptoms may resolve with or without further involvement from your physician, physical therapist or athletic trainer. While completing these exercises, remember:  Restoring tissue flexibility helps normal motion to return to the joints. This allows healthier, less painful movement and activity.  An effective stretch should be held for at least 30 seconds.  A stretch should never be painful. You should only feel a gentle lengthening or release in the stretched tissue. FLEXION RANGE OF MOTION AND STRETCHING EXERCISES: STRETCH - Flexion, Single  Knee to Chest   Lie on a firm bed or floor with both legs extended in front of you.  Keeping one leg in contact with the floor, bring your opposite knee to your chest. Hold your leg in place by either grabbing behind your thigh or at your knee.  Pull until you feel a gentle stretch in your lower back. Hold __________ seconds.  Slowly release your grasp and repeat the exercise with the opposite side. Repeat __________ times. Complete this exercise __________ times per day.  STRETCH - Flexion, Double Knee to Chest   Lie on a firm bed or floor with both legs extended in front of you.  Keeping one leg in contact with the floor, bring your opposite knee to your chest.  Tense your stomach muscles to support your back and then lift your other knee to your chest. Hold your legs in place by either grabbing behind your thighs or at your knees.  Pull both knees toward your chest until you feel a gentle stretch in your lower back. Hold __________ seconds.  Tense your stomach muscles and slowly return one leg at a time to the floor. Repeat __________  times. Complete this exercise __________ times per day.  STRETCH - Low Trunk Rotation  Lie on a firm bed or floor. Keeping your legs in front of you, bend your knees so they are both pointed toward the ceiling and your feet are flat on the floor.  Extend your arms out to the side. This will stabilize your upper body by keeping your shoulders in contact with the floor.  Gently and slowly drop both knees together to one side until you feel a gentle stretch in your lower back. Hold for __________ seconds.  Tense your stomach muscles to support your lower back as you bring your knees back to the starting position. Repeat the exercise to the other side. Repeat __________ times. Complete this exercise __________ times per day  EXTENSION RANGE OF MOTION AND FLEXIBILITY EXERCISES: STRETCH - Extension, Prone on Elbows   Lie on your stomach on the floor, a bed will be too soft. Place your palms about shoulder width apart and at the height of your head.  Place your elbows under your shoulders. If this is too painful, stack pillows under your chest.  Allow your body to relax so that your hips drop lower and make contact more completely with the floor.  Hold this position for __________ seconds.  Slowly return to lying flat on the floor. Repeat __________ times. Complete this exercise __________ times per day.  RANGE OF MOTION - Extension, Prone Press Ups  Lie on your stomach on the floor, a bed will be too soft. Place your palms about shoulder width apart and at the height of your head.  Keeping your back as relaxed as possible, slowly straighten your elbows while keeping your hips on the floor. You may adjust the placement of your hands to maximize your comfort. As you gain motion, your hands will come more underneath your shoulders.  Hold this position __________ seconds.  Slowly return to lying flat on the floor. Repeat __________ times. Complete this exercise __________ times per day.   RANGE OF MOTION- Quadruped, Neutral Spine   Assume a hands and knees position on a firm surface. Keep your hands under your shoulders and your knees under your hips. You may place padding under your knees for comfort.  Drop your head and point your tail bone toward the ground below  you. This will round out your lower back like an angry cat. Hold this position for __________ seconds.  Slowly lift your head and release your tail bone so that your back sags into a large arch, like an old horse.  Hold this position for __________ seconds.  Repeat this until you feel limber in your lower back.  Now, find your "sweet spot." This will be the most comfortable position somewhere between the two previous positions. This is your neutral spine. Once you have found this position, tense your stomach muscles to support your lower back.  Hold this position for __________ seconds. Repeat __________ times. Complete this exercise __________ times per day.  STRENGTHENING EXERCISES - Low Back Strain These exercises may help you when beginning to rehabilitate your injury. These exercises should be done near your "sweet spot." This is the neutral, low-back arch, somewhere between fully rounded and fully arched, that is your least painful position. When performed in this safe range of motion, these exercises can be used for people who have either a flexion or extension based injury. These exercises may resolve your symptoms with or without further involvement from your physician, physical therapist or athletic trainer. While completing these exercises, remember:   Muscles can gain both the endurance and the strength needed for everyday activities through controlled exercises.  Complete these exercises as instructed by your physician, physical therapist or athletic trainer. Increase the resistance and repetitions only as guided.  You may experience muscle soreness or fatigue, but the pain or discomfort you are  trying to eliminate should never worsen during these exercises. If this pain does worsen, stop and make certain you are following the directions exactly. If the pain is still present after adjustments, discontinue the exercise until you can discuss the trouble with your caregiver. STRENGTHENING - Deep Abdominals, Pelvic Tilt  Lie on a firm bed or floor. Keeping your legs in front of you, bend your knees so they are both pointed toward the ceiling and your feet are flat on the floor.  Tense your lower abdominal muscles to press your lower back into the floor. This motion will rotate your pelvis so that your tail bone is scooping upwards rather than pointing at your feet or into the floor.  With a gentle tension and even breathing, hold this position for __________ seconds. Repeat __________ times. Complete this exercise __________ times per day.  STRENGTHENING - Abdominals, Crunches   Lie on a firm bed or floor. Keeping your legs in front of you, bend your knees so they are both pointed toward the ceiling and your feet are flat on the floor. Cross your arms over your chest.  Slightly tip your chin down without bending your neck.  Tense your abdominals and slowly lift your trunk high enough to just clear your shoulder blades. Lifting higher can put excessive stress on the lower back and does not further strengthen your abdominal muscles.  Control your return to the starting position. Repeat __________ times. Complete this exercise __________ times per day.  STRENGTHENING - Quadruped, Opposite UE/LE Lift   Assume a hands and knees position on a firm surface. Keep your hands under your shoulders and your knees under your hips. You may place padding under your knees for comfort.  Find your neutral spine and gently tense your abdominal muscles so that you can maintain this position. Your shoulders and hips should form a rectangle that is parallel with the floor and is not twisted.  Keeping your  trunk steady, lift your right hand no higher than your shoulder and then your left leg no higher than your hip. Make sure you are not holding your breath. Hold this position __________ seconds.  Continuing to keep your abdominal muscles tense and your back steady, slowly return to your starting position. Repeat with the opposite arm and leg. Repeat __________ times. Complete this exercise __________ times per day.  STRENGTHENING - Lower Abdominals, Double Knee Lift  Lie on a firm bed or floor. Keeping your legs in front of you, bend your knees so they are both pointed toward the ceiling and your feet are flat on the floor.  Tense your abdominal muscles to brace your lower back and slowly lift both of your knees until they come over your hips. Be certain not to hold your breath.  Hold __________ seconds. Using your abdominal muscles, return to the starting position in a slow and controlled manner. Repeat __________ times. Complete this exercise __________ times per day.  POSTURE AND BODY MECHANICS CONSIDERATIONS - Low Back Strain Keeping correct posture when sitting, standing or completing your activities will reduce the stress put on different body tissues, allowing injured tissues a chance to heal and limiting painful experiences. The following are general guidelines for improved posture. Your physician or physical therapist will provide you with any instructions specific to your needs. While reading these guidelines, remember:  The exercises prescribed by your provider will help you have the flexibility and strength to maintain correct postures.  The correct posture provides the best environment for your joints to work. All of your joints have less wear and tear when properly supported by a spine with good posture. This means you will experience a healthier, less painful body.  Correct posture must be practiced with all of your activities, especially prolonged sitting and standing. Correct  posture is as important when doing repetitive low-stress activities (typing) as it is when doing a single heavy-load activity (lifting). RESTING POSITIONS Consider which positions are most painful for you when choosing a resting position. If you have pain with flexion-based activities (sitting, bending, stooping, squatting), choose a position that allows you to rest in a less flexed posture. You would want to avoid curling into a fetal position on your side. If your pain worsens with extension-based activities (prolonged standing, working overhead), avoid resting in an extended position such as sleeping on your stomach. Most people will find more comfort when they rest with their spine in a more neutral position, neither too rounded nor too arched. Lying on a non-sagging bed on your side with a pillow between your knees, or on your back with a pillow under your knees will often provide some relief. Keep in mind, being in any one position for a prolonged period of time, no matter how correct your posture, can still lead to stiffness. PROPER SITTING POSTURE In order to minimize stress and discomfort on your spine, you must sit with correct posture. Sitting with good posture should be effortless for a healthy body. Returning to good posture is a gradual process. Many people can work toward this most comfortably by using various supports until they have the flexibility and strength to maintain this posture on their own. When sitting with proper posture, your ears will fall over your shoulders and your shoulders will fall over your hips. You should use the back of the chair to support your upper back. Your lower back will be in a neutral position, just slightly arched. You may place  a small pillow or folded towel at the base of your lower back for support.  When working at a desk, create an environment that supports good, upright posture. Without extra support, muscles tire, which leads to excessive strain on  joints and other tissues. Keep these recommendations in mind: CHAIR:  A chair should be able to slide under your desk when your back makes contact with the back of the chair. This allows you to work closely.  The chair's height should allow your eyes to be level with the upper part of your monitor and your hands to be slightly lower than your elbows. BODY POSITION  Your feet should make contact with the floor. If this is not possible, use a foot rest.  Keep your ears over your shoulders. This will reduce stress on your neck and lower back. INCORRECT SITTING POSTURES  If you are feeling tired and unable to assume a healthy sitting posture, do not slouch or slump. This puts excessive strain on your back tissues, causing more damage and pain. Healthier options include:  Using more support, like a lumbar pillow.  Switching tasks to something that requires you to be upright or walking.  Talking a brief walk.  Lying down to rest in a neutral-spine position. PROLONGED STANDING WHILE SLIGHTLY LEANING FORWARD  When completing a task that requires you to lean forward while standing in one place for a long time, place either foot up on a stationary 2-4 inch high object to help maintain the best posture. When both feet are on the ground, the lower back tends to lose its slight inward curve. If this curve flattens (or becomes too large), then the back and your other joints will experience too much stress, tire more quickly, and can cause pain. CORRECT STANDING POSTURES Proper standing posture should be assumed with all daily activities, even if they only take a few moments, like when brushing your teeth. As in sitting, your ears should fall over your shoulders and your shoulders should fall over your hips. You should keep a slight tension in your abdominal muscles to brace your spine. Your tailbone should point down to the ground, not behind your body, resulting in an over-extended swayback posture.   INCORRECT STANDING POSTURES  Common incorrect standing postures include a forward head, locked knees and/or an excessive swayback. WALKING Walk with an upright posture. Your ears, shoulders and hips should all line-up. PROLONGED ACTIVITY IN A FLEXED POSITION When completing a task that requires you to bend forward at your waist or lean over a low surface, try to find a way to stabilize 3 out of 4 of your limbs. You can place a hand or elbow on your thigh or rest a knee on the surface you are reaching across. This will provide you more stability so that your muscles do not fatigue as quickly. By keeping your knees relaxed, or slightly bent, you will also reduce stress across your lower back. CORRECT LIFTING TECHNIQUES DO :   Assume a wide stance. This will provide you more stability and the opportunity to get as close as possible to the object which you are lifting.  Tense your abdominals to brace your spine. Bend at the knees and hips. Keeping your back locked in a neutral-spine position, lift using your leg muscles. Lift with your legs, keeping your back straight.  Test the weight of unknown objects before attempting to lift them.  Try to keep your elbows locked down at your sides in order get  the best strength from your shoulders when carrying an object.  Always ask for help when lifting heavy or awkward objects. INCORRECT LIFTING TECHNIQUES DO NOT:   Lock your knees when lifting, even if it is a small object.  Bend and twist. Pivot at your feet or move your feet when needing to change directions.  Assume that you can safely pick up even a paper clip without proper posture. Document Released: 11/29/2005 Document Revised: 02/21/2012 Document Reviewed: 03/13/2009 [facility] Patient Information 2015 [place], [place]. This information is not intended to replace advice given to you by your health care provider. Make sure you discuss any questions you have with your health care provider. Low  Back Strain with Rehab A strain is an injury in which a tendon or muscle is torn. The muscles and tendons of the lower back are vulnerable to strains. However, these muscles and tendons are very strong and require a great force to be injured. Strains are classified into three categories. Grade 1 strains cause pain, but the tendon is not lengthened. Grade 2 strains include a lengthened ligament, due to the ligament being stretched or partially ruptured. With grade 2 strains there is still function, although the function may be decreased. Grade 3 strains involve a complete tear of the tendon or muscle, and function is usually impaired. SYMPTOMS   Pain in the lower back.  Pain that affects one side more than the other.  Pain that gets worse with movement and may be felt in the hip, buttocks, or back of the thigh.  Muscle spasms of the muscles in the back.  Swelling along the muscles of the back.  Loss of strength of the back muscles.  Crackling sound (crepitation) when the muscles are touched. CAUSES  Lower back strains occur when a force is placed on the muscles or tendons that is greater than they can handle. Common causes of injury include:  Prolonged overuse of the muscle-tendon units in the lower back, usually from incorrect posture.  A single violent injury or force applied to the back. RISK INCREASES WITH:  Sports that involve twisting forces on the spine or a lot of bending at the waist (football, rugby, weightlifting, bowling, golf, tennis, speed skating, racquetball, swimming, running, gymnastics, diving).  Poor strength and flexibility.  Failure to warm up properly before activity.  Family history of lower back pain or disk disorders.  Previous back injury or surgery (especially fusion).  Poor posture with lifting, especially heavy objects.  Prolonged sitting, especially with poor posture. PREVENTION   Learn and use proper posture when sitting or lifting (maintain  proper posture when sitting, lift using the knees and legs, not at the waist).  Warm up and stretch properly before activity.  Allow for adequate recovery between workouts.  Maintain physical fitness:  Strength, flexibility, and endurance.  Cardiovascular fitness. PROGNOSIS  If treated properly, lower back strains usually heal within 6 weeks. RELATED COMPLICATIONS   Recurring symptoms, resulting in a chronic problem.  Chronic inflammation, scarring, and partial muscle-tendon tear.  Delayed healing or resolution of symptoms.  Prolonged disability. TREATMENT  Treatment first involves the use of ice and medicine, to reduce pain and inflammation. The use of strengthening and stretching exercises may help reduce pain with activity. These exercises may be performed at home or with a therapist. Severe injuries may require referral to a therapist for further evaluation and treatment, such as ultrasound. Your caregiver may advise that you wear a back brace or corset, to help reduce  pain and discomfort. Often, prolonged bed rest results in greater harm then benefit. Corticosteroid injections may be recommended. However, these should be reserved for the most serious cases. It is important to avoid using your back when lifting objects. At night, sleep on your back on a firm mattress with a pillow placed under your knees. If non-surgical treatment is unsuccessful, surgery may be needed.  MEDICATION   If pain medicine is needed, nonsteroidal anti-inflammatory medicines (aspirin and ibuprofen), or other minor pain relievers (acetaminophen), are often advised.  Do not take pain medicine for 7 days before surgery.  Prescription pain relievers may be given, if your caregiver thinks they are needed. Use only as directed and only as much as you need.  Ointments applied to the skin may be helpful.  Corticosteroid injections may be given by your caregiver. These injections should be reserved for the most  serious cases, because they may only be given a certain number of times. HEAT AND COLD  Cold treatment (icing) should be applied for 10 to 15 minutes every 2 to 3 hours for inflammation and pain, and immediately after activity that aggravates your symptoms. Use ice packs or an ice massage.  Heat treatment may be used before performing stretching and strengthening activities prescribed by your caregiver, physical therapist, or athletic trainer. Use a heat pack or a warm water soak. SEEK MEDICAL CARE IF:   Symptoms get worse or do not improve in 2 to 4 weeks, despite treatment.  You develop numbness, weakness, or loss of bowel or bladder function.  New, unexplained symptoms develop. (Drugs used in treatment may produce side effects.) EXERCISES  RANGE OF MOTION (ROM) AND STRETCHING EXERCISES - Low Back Strain Most people with lower back pain will find that their symptoms get worse with excessive bending forward (flexion) or arching at the lower back (extension). The exercises which will help resolve your symptoms will focus on the opposite motion.  Your physician, physical therapist or athletic trainer will help you determine which exercises will be most helpful to resolve your lower back pain. Do not complete any exercises without first consulting with your caregiver. Discontinue any exercises which make your symptoms worse until you speak to your caregiver.  If you have pain, numbness or tingling which travels down into your buttocks, leg or foot, the goal of the therapy is for these symptoms to move closer to your back and eventually resolve. Sometimes, these leg symptoms will get better, but your lower back pain may worsen. This is typically an indication of progress in your rehabilitation. Be very alert to any changes in your symptoms and the activities in which you participated in the 24 hours prior to the change. Sharing this information with your caregiver will allow him/her to most efficiently  treat your condition.  These exercises may help you when beginning to rehabilitate your injury. Your symptoms may resolve with or without further involvement from your physician, physical therapist or athletic trainer. While completing these exercises, remember:  Restoring tissue flexibility helps normal motion to return to the joints. This allows healthier, less painful movement and activity.  An effective stretch should be held for at least 30 seconds.  A stretch should never be painful. You should only feel a gentle lengthening or release in the stretched tissue. FLEXION RANGE OF MOTION AND STRETCHING EXERCISES: STRETCH - Flexion, Single Knee to Chest   Lie on a firm bed or floor with both legs extended in front of you.  Keeping one leg  in contact with the floor, bring your opposite knee to your chest. Hold your leg in place by either grabbing behind your thigh or at your knee.  Pull until you feel a gentle stretch in your lower back. Hold __________ seconds.  Slowly release your grasp and repeat the exercise with the opposite side. Repeat __________ times. Complete this exercise __________ times per day.  STRETCH - Flexion, Double Knee to Chest   Lie on a firm bed or floor with both legs extended in front of you.  Keeping one leg in contact with the floor, bring your opposite knee to your chest.  Tense your stomach muscles to support your back and then lift your other knee to your chest. Hold your legs in place by either grabbing behind your thighs or at your knees.  Pull both knees toward your chest until you feel a gentle stretch in your lower back. Hold __________ seconds.  Tense your stomach muscles and slowly return one leg at a time to the floor. Repeat __________ times. Complete this exercise __________ times per day.  STRETCH - Low Trunk Rotation  Lie on a firm bed or floor. Keeping your legs in front of you, bend your knees so they are both pointed toward the ceiling  and your feet are flat on the floor.  Extend your arms out to the side. This will stabilize your upper body by keeping your shoulders in contact with the floor.  Gently and slowly drop both knees together to one side until you feel a gentle stretch in your lower back. Hold for __________ seconds.  Tense your stomach muscles to support your lower back as you bring your knees back to the starting position. Repeat the exercise to the other side. Repeat __________ times. Complete this exercise __________ times per day  EXTENSION RANGE OF MOTION AND FLEXIBILITY EXERCISES: STRETCH - Extension, Prone on Elbows   Lie on your stomach on the floor, a bed will be too soft. Place your palms about shoulder width apart and at the height of your head.  Place your elbows under your shoulders. If this is too painful, stack pillows under your chest.  Allow your body to relax so that your hips drop lower and make contact more completely with the floor.  Hold this position for __________ seconds.  Slowly return to lying flat on the floor. Repeat __________ times. Complete this exercise __________ times per day.  RANGE OF MOTION - Extension, Prone Press Ups  Lie on your stomach on the floor, a bed will be too soft. Place your palms about shoulder width apart and at the height of your head.  Keeping your back as relaxed as possible, slowly straighten your elbows while keeping your hips on the floor. You may adjust the placement of your hands to maximize your comfort. As you gain motion, your hands will come more underneath your shoulders.  Hold this position __________ seconds.  Slowly return to lying flat on the floor. Repeat __________ times. Complete this exercise __________ times per day.  RANGE OF MOTION- Quadruped, Neutral Spine   Assume a hands and knees position on a firm surface. Keep your hands under your shoulders and your knees under your hips. You may place padding under your knees for  comfort.  Drop your head and point your tail bone toward the ground below you. This will round out your lower back like an angry cat. Hold this position for __________ seconds.  Slowly lift your head and  release your tail bone so that your back sags into a large arch, like an old horse.  Hold this position for __________ seconds.  Repeat this until you feel limber in your lower back.  Now, find your "sweet spot." This will be the most comfortable position somewhere between the two previous positions. This is your neutral spine. Once you have found this position, tense your stomach muscles to support your lower back.  Hold this position for __________ seconds. Repeat __________ times. Complete this exercise __________ times per day.  STRENGTHENING EXERCISES - Low Back Strain These exercises may help you when beginning to rehabilitate your injury. These exercises should be done near your "sweet spot." This is the neutral, low-back arch, somewhere between fully rounded and fully arched, that is your least painful position. When performed in this safe range of motion, these exercises can be used for people who have either a flexion or extension based injury. These exercises may resolve your symptoms with or without further involvement from your physician, physical therapist or athletic trainer. While completing these exercises, remember:   Muscles can gain both the endurance and the strength needed for everyday activities through controlled exercises.  Complete these exercises as instructed by your physician, physical therapist or athletic trainer. Increase the resistance and repetitions only as guided.  You may experience muscle soreness or fatigue, but the pain or discomfort you are trying to eliminate should never worsen during these exercises. If this pain does worsen, stop and make certain you are following the directions exactly. If the pain is still present after adjustments, discontinue the  exercise until you can discuss the trouble with your caregiver. STRENGTHENING - Deep Abdominals, Pelvic Tilt  Lie on a firm bed or floor. Keeping your legs in front of you, bend your knees so they are both pointed toward the ceiling and your feet are flat on the floor.  Tense your lower abdominal muscles to press your lower back into the floor. This motion will rotate your pelvis so that your tail bone is scooping upwards rather than pointing at your feet or into the floor.  With a gentle tension and even breathing, hold this position for __________ seconds. Repeat __________ times. Complete this exercise __________ times per day.  STRENGTHENING - Abdominals, Crunches   Lie on a firm bed or floor. Keeping your legs in front of you, bend your knees so they are both pointed toward the ceiling and your feet are flat on the floor. Cross your arms over your chest.  Slightly tip your chin down without bending your neck.  Tense your abdominals and slowly lift your trunk high enough to just clear your shoulder blades. Lifting higher can put excessive stress on the lower back and does not further strengthen your abdominal muscles.  Control your return to the starting position. Repeat __________ times. Complete this exercise __________ times per day.  STRENGTHENING - Quadruped, Opposite UE/LE Lift   Assume a hands and knees position on a firm surface. Keep your hands under your shoulders and your knees under your hips. You may place padding under your knees for comfort.  Find your neutral spine and gently tense your abdominal muscles so that you can maintain this position. Your shoulders and hips should form a rectangle that is parallel with the floor and is not twisted.  Keeping your trunk steady, lift your right hand no higher than your shoulder and then your left leg no higher than your hip. Make sure you are  not holding your breath. Hold this position __________ seconds.  Continuing to keep your  abdominal muscles tense and your back steady, slowly return to your starting position. Repeat with the opposite arm and leg. Repeat __________ times. Complete this exercise __________ times per day.  STRENGTHENING - Lower Abdominals, Double Knee Lift  Lie on a firm bed or floor. Keeping your legs in front of you, bend your knees so they are both pointed toward the ceiling and your feet are flat on the floor.  Tense your abdominal muscles to brace your lower back and slowly lift both of your knees until they come over your hips. Be certain not to hold your breath.  Hold __________ seconds. Using your abdominal muscles, return to the starting position in a slow and controlled manner. Repeat __________ times. Complete this exercise __________ times per day.  POSTURE AND BODY MECHANICS CONSIDERATIONS - Low Back Strain Keeping correct posture when sitting, standing or completing your activities will reduce the stress put on different body tissues, allowing injured tissues a chance to heal and limiting painful experiences. The following are general guidelines for improved posture. Your physician or physical therapist will provide you with any instructions specific to your needs. While reading these guidelines, remember:  The exercises prescribed by your provider will help you have the flexibility and strength to maintain correct postures.  The correct posture provides the best environment for your joints to work. All of your joints have less wear and tear when properly supported by a spine with good posture. This means you will experience a healthier, less painful body.  Correct posture must be practiced with all of your activities, especially prolonged sitting and standing. Correct posture is as important when doing repetitive low-stress activities (typing) as it is when doing a single heavy-load activity (lifting). RESTING POSITIONS Consider which positions are most painful for you when choosing a  resting position. If you have pain with flexion-based activities (sitting, bending, stooping, squatting), choose a position that allows you to rest in a less flexed posture. You would want to avoid curling into a fetal position on your side. If your pain worsens with extension-based activities (prolonged standing, working overhead), avoid resting in an extended position such as sleeping on your stomach. Most people will find more comfort when they rest with their spine in a more neutral position, neither too rounded nor too arched. Lying on a non-sagging bed on your side with a pillow between your knees, or on your back with a pillow under your knees will often provide some relief. Keep in mind, being in any one position for a prolonged period of time, no matter how correct your posture, can still lead to stiffness. PROPER SITTING POSTURE In order to minimize stress and discomfort on your spine, you must sit with correct posture. Sitting with good posture should be effortless for a healthy body. Returning to good posture is a gradual process. Many people can work toward this most comfortably by using various supports until they have the flexibility and strength to maintain this posture on their own. When sitting with proper posture, your ears will fall over your shoulders and your shoulders will fall over your hips. You should use the back of the chair to support your upper back. Your lower back will be in a neutral position, just slightly arched. You may place a small pillow or folded towel at the base of your lower back for support.  When working at a desk, create an environment  that supports good, upright posture. Without extra support, muscles tire, which leads to excessive strain on joints and other tissues. Keep these recommendations in mind: CHAIR:  A chair should be able to slide under your desk when your back makes contact with the back of the chair. This allows you to work closely.  The chair's  height should allow your eyes to be level with the upper part of your monitor and your hands to be slightly lower than your elbows. BODY POSITION  Your feet should make contact with the floor. If this is not possible, use a foot rest.  Keep your ears over your shoulders. This will reduce stress on your neck and lower back. INCORRECT SITTING POSTURES  If you are feeling tired and unable to assume a healthy sitting posture, do not slouch or slump. This puts excessive strain on your back tissues, causing more damage and pain. Healthier options include:  Using more support, like a lumbar pillow.  Switching tasks to something that requires you to be upright or walking.  Talking a brief walk.  Lying down to rest in a neutral-spine position. PROLONGED STANDING WHILE SLIGHTLY LEANING FORWARD  When completing a task that requires you to lean forward while standing in one place for a long time, place either foot up on a stationary 2-4 inch high object to help maintain the best posture. When both feet are on the ground, the lower back tends to lose its slight inward curve. If this curve flattens (or becomes too large), then the back and your other joints will experience too much stress, tire more quickly, and can cause pain. CORRECT STANDING POSTURES Proper standing posture should be assumed with all daily activities, even if they only take a few moments, like when brushing your teeth. As in sitting, your ears should fall over your shoulders and your shoulders should fall over your hips. You should keep a slight tension in your abdominal muscles to brace your spine. Your tailbone should point down to the ground, not behind your body, resulting in an over-extended swayback posture.  INCORRECT STANDING POSTURES  Common incorrect standing postures include a forward head, locked knees and/or an excessive swayback. WALKING Walk with an upright posture. Your ears, shoulders and hips should all  line-up. PROLONGED ACTIVITY IN A FLEXED POSITION When completing a task that requires you to bend forward at your waist or lean over a low surface, try to find a way to stabilize 3 out of 4 of your limbs. You can place a hand or elbow on your thigh or rest a knee on the surface you are reaching across. This will provide you more stability so that your muscles do not fatigue as quickly. By keeping your knees relaxed, or slightly bent, you will also reduce stress across your lower back. CORRECT LIFTING TECHNIQUES DO :   Assume a wide stance. This will provide you more stability and the opportunity to get as close as possible to the object which you are lifting.  Tense your abdominals to brace your spine. Bend at the knees and hips. Keeping your back locked in a neutral-spine position, lift using your leg muscles. Lift with your legs, keeping your back straight.  Test the weight of unknown objects before attempting to lift them.  Try to keep your elbows locked down at your sides in order get the best strength from your shoulders when carrying an object.  Always ask for help when lifting heavy or awkward objects. INCORRECT LIFTING TECHNIQUES  DO NOT:   Lock your knees when lifting, even if it is a small object.  Bend and twist. Pivot at your feet or move your feet when needing to change directions.  Assume that you can safely pick up even a paper clip without proper posture. Document Released: 11/29/2005 Document Revised: 02/21/2012 Document Reviewed: 03/13/2009 [facility] Patient Information 2015 [place], [place]. This information is not intended to replace advice given to you by your health care provider. Make sure you discuss any questions you have with your health care provider. Low Back Strain with Rehab A strain is an injury in which a tendon or muscle is torn. The muscles and tendons of the lower back are vulnerable to strains. However, these muscles and tendons are very strong and require  a great force to be injured. Strains are classified into three categories. Grade 1 strains cause pain, but the tendon is not lengthened. Grade 2 strains include a lengthened ligament, due to the ligament being stretched or partially ruptured. With grade 2 strains there is still function, although the function may be decreased. Grade 3 strains involve a complete tear of the tendon or muscle, and function is usually impaired. SYMPTOMS   Pain in the lower back.  Pain that affects one side more than the other.  Pain that gets worse with movement and may be felt in the hip, buttocks, or back of the thigh.  Muscle spasms of the muscles in the back.  Swelling along the muscles of the back.  Loss of strength of the back muscles.  Crackling sound (crepitation) when the muscles are touched. CAUSES  Lower back strains occur when a force is placed on the muscles or tendons that is greater than they can handle. Common causes of injury include:  Prolonged overuse of the muscle-tendon units in the lower back, usually from incorrect posture.  A single violent injury or force applied to the back. RISK INCREASES WITH:  Sports that involve twisting forces on the spine or a lot of bending at the waist (football, rugby, weightlifting, bowling, golf, tennis, speed skating, racquetball, swimming, running, gymnastics, diving).  Poor strength and flexibility.  Failure to warm up properly before activity.  Family history of lower back pain or disk disorders.  Previous back injury or surgery (especially fusion).  Poor posture with lifting, especially heavy objects.  Prolonged sitting, especially with poor posture. PREVENTION   Learn and use proper posture when sitting or lifting (maintain proper posture when sitting, lift using the knees and legs, not at the waist).  Warm up and stretch properly before activity.  Allow for adequate recovery between workouts.  Maintain physical fitness:  Strength,  flexibility, and endurance.  Cardiovascular fitness. PROGNOSIS  If treated properly, lower back strains usually heal within 6 weeks. RELATED COMPLICATIONS   Recurring symptoms, resulting in a chronic problem.  Chronic inflammation, scarring, and partial muscle-tendon tear.  Delayed healing or resolution of symptoms.  Prolonged disability. TREATMENT  Treatment first involves the use of ice and medicine, to reduce pain and inflammation. The use of strengthening and stretching exercises may help reduce pain with activity. These exercises may be performed at home or with a therapist. Severe injuries may require referral to a therapist for further evaluation and treatment, such as ultrasound. Your caregiver may advise that you wear a back brace or corset, to help reduce pain and discomfort. Often, prolonged bed rest results in greater harm then benefit. Corticosteroid injections may be recommended. However, these should be reserved for  the most serious cases. It is important to avoid using your back when lifting objects. At night, sleep on your back on a firm mattress with a pillow placed under your knees. If non-surgical treatment is unsuccessful, surgery may be needed.  MEDICATION   If pain medicine is needed, nonsteroidal anti-inflammatory medicines (aspirin and ibuprofen), or other minor pain relievers (acetaminophen), are often advised.  Do not take pain medicine for 7 days before surgery.  Prescription pain relievers may be given, if your caregiver thinks they are needed. Use only as directed and only as much as you need.  Ointments applied to the skin may be helpful.  Corticosteroid injections may be given by your caregiver. These injections should be reserved for the most serious cases, because they may only be given a certain number of times. HEAT AND COLD  Cold treatment (icing) should be applied for 10 to 15 minutes every 2 to 3 hours for inflammation and pain, and immediately  after activity that aggravates your symptoms. Use ice packs or an ice massage.  Heat treatment may be used before performing stretching and strengthening activities prescribed by your caregiver, physical therapist, or athletic trainer. Use a heat pack or a warm water soak. SEEK MEDICAL CARE IF:   Symptoms get worse or do not improve in 2 to 4 weeks, despite treatment.  You develop numbness, weakness, or loss of bowel or bladder function.  New, unexplained symptoms develop. (Drugs used in treatment may produce side effects.) EXERCISES  RANGE OF MOTION (ROM) AND STRETCHING EXERCISES - Low Back Strain Most people with lower back pain will find that their symptoms get worse with excessive bending forward (flexion) or arching at the lower back (extension). The exercises which will help resolve your symptoms will focus on the opposite motion.  Your physician, physical therapist or athletic trainer will help you determine which exercises will be most helpful to resolve your lower back pain. Do not complete any exercises without first consulting with your caregiver. Discontinue any exercises which make your symptoms worse until you speak to your caregiver.  If you have pain, numbness or tingling which travels down into your buttocks, leg or foot, the goal of the therapy is for these symptoms to move closer to your back and eventually resolve. Sometimes, these leg symptoms will get better, but your lower back pain may worsen. This is typically an indication of progress in your rehabilitation. Be very alert to any changes in your symptoms and the activities in which you participated in the 24 hours prior to the change. Sharing this information with your caregiver will allow him/her to most efficiently treat your condition.  These exercises may help you when beginning to rehabilitate your injury. Your symptoms may resolve with or without further involvement from your physician, physical therapist or athletic  trainer. While completing these exercises, remember:  Restoring tissue flexibility helps normal motion to return to the joints. This allows healthier, less painful movement and activity.  An effective stretch should be held for at least 30 seconds.  A stretch should never be painful. You should only feel a gentle lengthening or release in the stretched tissue. FLEXION RANGE OF MOTION AND STRETCHING EXERCISES: STRETCH - Flexion, Single Knee to Chest   Lie on a firm bed or floor with both legs extended in front of you.  Keeping one leg in contact with the floor, bring your opposite knee to your chest. Hold your leg in place by either grabbing behind your thigh or  at your knee.  Pull until you feel a gentle stretch in your lower back. Hold __________ seconds.  Slowly release your grasp and repeat the exercise with the opposite side. Repeat __________ times. Complete this exercise __________ times per day.  STRETCH - Flexion, Double Knee to Chest   Lie on a firm bed or floor with both legs extended in front of you.  Keeping one leg in contact with the floor, bring your opposite knee to your chest.  Tense your stomach muscles to support your back and then lift your other knee to your chest. Hold your legs in place by either grabbing behind your thighs or at your knees.  Pull both knees toward your chest until you feel a gentle stretch in your lower back. Hold __________ seconds.  Tense your stomach muscles and slowly return one leg at a time to the floor. Repeat __________ times. Complete this exercise __________ times per day.  STRETCH - Low Trunk Rotation  Lie on a firm bed or floor. Keeping your legs in front of you, bend your knees so they are both pointed toward the ceiling and your feet are flat on the floor.  Extend your arms out to the side. This will stabilize your upper body by keeping your shoulders in contact with the floor.  Gently and slowly drop both knees together to  one side until you feel a gentle stretch in your lower back. Hold for __________ seconds.  Tense your stomach muscles to support your lower back as you bring your knees back to the starting position. Repeat the exercise to the other side. Repeat __________ times. Complete this exercise __________ times per day  EXTENSION RANGE OF MOTION AND FLEXIBILITY EXERCISES: STRETCH - Extension, Prone on Elbows   Lie on your stomach on the floor, a bed will be too soft. Place your palms about shoulder width apart and at the height of your head.  Place your elbows under your shoulders. If this is too painful, stack pillows under your chest.  Allow your body to relax so that your hips drop lower and make contact more completely with the floor.  Hold this position for __________ seconds.  Slowly return to lying flat on the floor. Repeat __________ times. Complete this exercise __________ times per day.  RANGE OF MOTION - Extension, Prone Press Ups  Lie on your stomach on the floor, a bed will be too soft. Place your palms about shoulder width apart and at the height of your head.  Keeping your back as relaxed as possible, slowly straighten your elbows while keeping your hips on the floor. You may adjust the placement of your hands to maximize your comfort. As you gain motion, your hands will come more underneath your shoulders.  Hold this position __________ seconds.  Slowly return to lying flat on the floor. Repeat __________ times. Complete this exercise __________ times per day.  RANGE OF MOTION- Quadruped, Neutral Spine   Assume a hands and knees position on a firm surface. Keep your hands under your shoulders and your knees under your hips. You may place padding under your knees for comfort.  Drop your head and point your tail bone toward the ground below you. This will round out your lower back like an angry cat. Hold this position for __________ seconds.  Slowly lift your head and release  your tail bone so that your back sags into a large arch, like an old horse.  Hold this position for __________ seconds.  Repeat this until you feel limber in your lower back.  Now, find your "sweet spot." This will be the most comfortable position somewhere between the two previous positions. This is your neutral spine. Once you have found this position, tense your stomach muscles to support your lower back.  Hold this position for __________ seconds. Repeat __________ times. Complete this exercise __________ times per day.  STRENGTHENING EXERCISES - Low Back Strain These exercises may help you when beginning to rehabilitate your injury. These exercises should be done near your "sweet spot." This is the neutral, low-back arch, somewhere between fully rounded and fully arched, that is your least painful position. When performed in this safe range of motion, these exercises can be used for people who have either a flexion or extension based injury. These exercises may resolve your symptoms with or without further involvement from your physician, physical therapist or athletic trainer. While completing these exercises, remember:   Muscles can gain both the endurance and the strength needed for everyday activities through controlled exercises.  Complete these exercises as instructed by your physician, physical therapist or athletic trainer. Increase the resistance and repetitions only as guided.  You may experience muscle soreness or fatigue, but the pain or discomfort you are trying to eliminate should never worsen during these exercises. If this pain does worsen, stop and make certain you are following the directions exactly. If the pain is still present after adjustments, discontinue the exercise until you can discuss the trouble with your caregiver. STRENGTHENING - Deep Abdominals, Pelvic Tilt  Lie on a firm bed or floor. Keeping your legs in front of you, bend your knees so they are both pointed  toward the ceiling and your feet are flat on the floor.  Tense your lower abdominal muscles to press your lower back into the floor. This motion will rotate your pelvis so that your tail bone is scooping upwards rather than pointing at your feet or into the floor.  With a gentle tension and even breathing, hold this position for __________ seconds. Repeat __________ times. Complete this exercise __________ times per day.  STRENGTHENING - Abdominals, Crunches   Lie on a firm bed or floor. Keeping your legs in front of you, bend your knees so they are both pointed toward the ceiling and your feet are flat on the floor. Cross your arms over your chest.  Slightly tip your chin down without bending your neck.  Tense your abdominals and slowly lift your trunk high enough to just clear your shoulder blades. Lifting higher can put excessive stress on the lower back and does not further strengthen your abdominal muscles.  Control your return to the starting position. Repeat __________ times. Complete this exercise __________ times per day.  STRENGTHENING - Quadruped, Opposite UE/LE Lift   Assume a hands and knees position on a firm surface. Keep your hands under your shoulders and your knees under your hips. You may place padding under your knees for comfort.  Find your neutral spine and gently tense your abdominal muscles so that you can maintain this position. Your shoulders and hips should form a rectangle that is parallel with the floor and is not twisted.  Keeping your trunk steady, lift your right hand no higher than your shoulder and then your left leg no higher than your hip. Make sure you are not holding your breath. Hold this position __________ seconds.  Continuing to keep your abdominal muscles tense and your back steady, slowly return to your  starting position. Repeat with the opposite arm and leg. Repeat __________ times. Complete this exercise __________ times per day.  STRENGTHENING  - Lower Abdominals, Double Knee Lift  Lie on a firm bed or floor. Keeping your legs in front of you, bend your knees so they are both pointed toward the ceiling and your feet are flat on the floor.  Tense your abdominal muscles to brace your lower back and slowly lift both of your knees until they come over your hips. Be certain not to hold your breath.  Hold __________ seconds. Using your abdominal muscles, return to the starting position in a slow and controlled manner. Repeat __________ times. Complete this exercise __________ times per day.  POSTURE AND BODY MECHANICS CONSIDERATIONS - Low Back Strain Keeping correct posture when sitting, standing or completing your activities will reduce the stress put on different body tissues, allowing injured tissues a chance to heal and limiting painful experiences. The following are general guidelines for improved posture. Your physician or physical therapist will provide you with any instructions specific to your needs. While reading these guidelines, remember:  The exercises prescribed by your provider will help you have the flexibility and strength to maintain correct postures.  The correct posture provides the best environment for your joints to work. All of your joints have less wear and tear when properly supported by a spine with good posture. This means you will experience a healthier, less painful body.  Correct posture must be practiced with all of your activities, especially prolonged sitting and standing. Correct posture is as important when doing repetitive low-stress activities (typing) as it is when doing a single heavy-load activity (lifting). RESTING POSITIONS Consider which positions are most painful for you when choosing a resting position. If you have pain with flexion-based activities (sitting, bending, stooping, squatting), choose a position that allows you to rest in a less flexed posture. You would want to avoid curling into a fetal  position on your side. If your pain worsens with extension-based activities (prolonged standing, working overhead), avoid resting in an extended position such as sleeping on your stomach. Most people will find more comfort when they rest with their spine in a more neutral position, neither too rounded nor too arched. Lying on a non-sagging bed on your side with a pillow between your knees, or on your back with a pillow under your knees will often provide some relief. Keep in mind, being in any one position for a prolonged period of time, no matter how correct your posture, can still lead to stiffness. PROPER SITTING POSTURE In order to minimize stress and discomfort on your spine, you must sit with correct posture. Sitting with good posture should be effortless for a healthy body. Returning to good posture is a gradual process. Many people can work toward this most comfortably by using various supports until they have the flexibility and strength to maintain this posture on their own. When sitting with proper posture, your ears will fall over your shoulders and your shoulders will fall over your hips. You should use the back of the chair to support your upper back. Your lower back will be in a neutral position, just slightly arched. You may place a small pillow or folded towel at the base of your lower back for support.  When working at a desk, create an environment that supports good, upright posture. Without extra support, muscles tire, which leads to excessive strain on joints and other tissues. Keep these recommendations in mind:  CHAIR:  A chair should be able to slide under your desk when your back makes contact with the back of the chair. This allows you to work closely.  The chair's height should allow your eyes to be level with the upper part of your monitor and your hands to be slightly lower than your elbows. BODY POSITION  Your feet should make contact with the floor. If this is not possible,  use a foot rest.  Keep your ears over your shoulders. This will reduce stress on your neck and lower back. INCORRECT SITTING POSTURES  If you are feeling tired and unable to assume a healthy sitting posture, do not slouch or slump. This puts excessive strain on your back tissues, causing more damage and pain. Healthier options include:  Using more support, like a lumbar pillow.  Switching tasks to something that requires you to be upright or walking.  Talking a brief walk.  Lying down to rest in a neutral-spine position. PROLONGED STANDING WHILE SLIGHTLY LEANING FORWARD  When completing a task that requires you to lean forward while standing in one place for a long time, place either foot up on a stationary 2-4 inch high object to help maintain the best posture. When both feet are on the ground, the lower back tends to lose its slight inward curve. If this curve flattens (or becomes too large), then the back and your other joints will experience too much stress, tire more quickly, and can cause pain. CORRECT STANDING POSTURES Proper standing posture should be assumed with all daily activities, even if they only take a few moments, like when brushing your teeth. As in sitting, your ears should fall over your shoulders and your shoulders should fall over your hips. You should keep a slight tension in your abdominal muscles to brace your spine. Your tailbone should point down to the ground, not behind your body, resulting in an over-extended swayback posture.  INCORRECT STANDING POSTURES  Common incorrect standing postures include a forward head, locked knees and/or an excessive swayback. WALKING Walk with an upright posture. Your ears, shoulders and hips should all line-up. PROLONGED ACTIVITY IN A FLEXED POSITION When completing a task that requires you to bend forward at your waist or lean over a low surface, try to find a way to stabilize 3 out of 4 of your limbs. You can place a hand or elbow  on your thigh or rest a knee on the surface you are reaching across. This will provide you more stability so that your muscles do not fatigue as quickly. By keeping your knees relaxed, or slightly bent, you will also reduce stress across your lower back. CORRECT LIFTING TECHNIQUES DO :   Assume a wide stance. This will provide you more stability and the opportunity to get as close as possible to the object which you are lifting.  Tense your abdominals to brace your spine. Bend at the knees and hips. Keeping your back locked in a neutral-spine position, lift using your leg muscles. Lift with your legs, keeping your back straight.  Test the weight of unknown objects before attempting to lift them.  Try to keep your elbows locked down at your sides in order get the best strength from your shoulders when carrying an object.  Always ask for help when lifting heavy or awkward objects. INCORRECT LIFTING TECHNIQUES DO NOT:   Lock your knees when lifting, even if it is a small object.  Bend and twist. Pivot at your feet or  move your feet when needing to change directions.  Assume that you can safely pick up even a paper clip without proper posture. Document Released: 11/29/2005 Document Revised: 02/21/2012 Document Reviewed: 03/13/2009 [facility] Patient Information 2015 [place], [place]. This information is not intended to replace advice given to you by your health care provider. Make sure you discuss any questions you have with your health care provider. Low Back Strain with Rehab A strain is an injury in which a tendon or muscle is torn. The muscles and tendons of the lower back are vulnerable to strains. However, these muscles and tendons are very strong and require a great force to be injured. Strains are classified into three categories. Grade 1 strains cause pain, but the tendon is not lengthened. Grade 2 strains include a lengthened ligament, due to the ligament being stretched or partially  ruptured. With grade 2 strains there is still function, although the function may be decreased. Grade 3 strains involve a complete tear of the tendon or muscle, and function is usually impaired. SYMPTOMS   Pain in the lower back.  Pain that affects one side more than the other.  Pain that gets worse with movement and may be felt in the hip, buttocks, or back of the thigh.  Muscle spasms of the muscles in the back.  Swelling along the muscles of the back.  Loss of strength of the back muscles.  Crackling sound (crepitation) when the muscles are touched. CAUSES  Lower back strains occur when a force is placed on the muscles or tendons that is greater than they can handle. Common causes of injury include:  Prolonged overuse of the muscle-tendon units in the lower back, usually from incorrect posture.  A single violent injury or force applied to the back. RISK INCREASES WITH:  Sports that involve twisting forces on the spine or a lot of bending at the waist (football, rugby, weightlifting, bowling, golf, tennis, speed skating, racquetball, swimming, running, gymnastics, diving).  Poor strength and flexibility.  Failure to warm up properly before activity.  Family history of lower back pain or disk disorders.  Previous back injury or surgery (especially fusion).  Poor posture with lifting, especially heavy objects.  Prolonged sitting, especially with poor posture. PREVENTION   Learn and use proper posture when sitting or lifting (maintain proper posture when sitting, lift using the knees and legs, not at the waist).  Warm up and stretch properly before activity.  Allow for adequate recovery between workouts.  Maintain physical fitness:  Strength, flexibility, and endurance.  Cardiovascular fitness. PROGNOSIS  If treated properly, lower back strains usually heal within 6 weeks. RELATED COMPLICATIONS   Recurring symptoms, resulting in a chronic problem.  Chronic  inflammation, scarring, and partial muscle-tendon tear.  Delayed healing or resolution of symptoms.  Prolonged disability. TREATMENT  Treatment first involves the use of ice and medicine, to reduce pain and inflammation. The use of strengthening and stretching exercises may help reduce pain with activity. These exercises may be performed at home or with a therapist. Severe injuries may require referral to a therapist for further evaluation and treatment, such as ultrasound. Your caregiver may advise that you wear a back brace or corset, to help reduce pain and discomfort. Often, prolonged bed rest results in greater harm then benefit. Corticosteroid injections may be recommended. However, these should be reserved for the most serious cases. It is important to avoid using your back when lifting objects. At night, sleep on your back on a firm mattress  with a pillow placed under your knees. If non-surgical treatment is unsuccessful, surgery may be needed.  MEDICATION   If pain medicine is needed, nonsteroidal anti-inflammatory medicines (aspirin and ibuprofen), or other minor pain relievers (acetaminophen), are often advised.  Do not take pain medicine for 7 days before surgery.  Prescription pain relievers may be given, if your caregiver thinks they are needed. Use only as directed and only as much as you need.  Ointments applied to the skin may be helpful.  Corticosteroid injections may be given by your caregiver. These injections should be reserved for the most serious cases, because they may only be given a certain number of times. HEAT AND COLD  Cold treatment (icing) should be applied for 10 to 15 minutes every 2 to 3 hours for inflammation and pain, and immediately after activity that aggravates your symptoms. Use ice packs or an ice massage.  Heat treatment may be used before performing stretching and strengthening activities prescribed by your caregiver, physical therapist, or athletic  trainer. Use a heat pack or a warm water soak. SEEK MEDICAL CARE IF:   Symptoms get worse or do not improve in 2 to 4 weeks, despite treatment.  You develop numbness, weakness, or loss of bowel or bladder function.  New, unexplained symptoms develop. (Drugs used in treatment may produce side effects.) EXERCISES  RANGE OF MOTION (ROM) AND STRETCHING EXERCISES - Low Back Strain Most people with lower back pain will find that their symptoms get worse with excessive bending forward (flexion) or arching at the lower back (extension). The exercises which will help resolve your symptoms will focus on the opposite motion.  Your physician, physical therapist or athletic trainer will help you determine which exercises will be most helpful to resolve your lower back pain. Do not complete any exercises without first consulting with your caregiver. Discontinue any exercises which make your symptoms worse until you speak to your caregiver.  If you have pain, numbness or tingling which travels down into your buttocks, leg or foot, the goal of the therapy is for these symptoms to move closer to your back and eventually resolve. Sometimes, these leg symptoms will get better, but your lower back pain may worsen. This is typically an indication of progress in your rehabilitation. Be very alert to any changes in your symptoms and the activities in which you participated in the 24 hours prior to the change. Sharing this information with your caregiver will allow him/her to most efficiently treat your condition.  These exercises may help you when beginning to rehabilitate your injury. Your symptoms may resolve with or without further involvement from your physician, physical therapist or athletic trainer. While completing these exercises, remember:  Restoring tissue flexibility helps normal motion to return to the joints. This allows healthier, less painful movement and activity.  An effective stretch should be held for  at least 30 seconds.  A stretch should never be painful. You should only feel a gentle lengthening or release in the stretched tissue. FLEXION RANGE OF MOTION AND STRETCHING EXERCISES: STRETCH - Flexion, Single Knee to Chest   Lie on a firm bed or floor with both legs extended in front of you.  Keeping one leg in contact with the floor, bring your opposite knee to your chest. Hold your leg in place by either grabbing behind your thigh or at your knee.  Pull until you feel a gentle stretch in your lower back. Hold __________ seconds.  Slowly release your grasp and repeat  the exercise with the opposite side. Repeat __________ times. Complete this exercise __________ times per day.  STRETCH - Flexion, Double Knee to Chest   Lie on a firm bed or floor with both legs extended in front of you.  Keeping one leg in contact with the floor, bring your opposite knee to your chest.  Tense your stomach muscles to support your back and then lift your other knee to your chest. Hold your legs in place by either grabbing behind your thighs or at your knees.  Pull both knees toward your chest until you feel a gentle stretch in your lower back. Hold __________ seconds.  Tense your stomach muscles and slowly return one leg at a time to the floor. Repeat __________ times. Complete this exercise __________ times per day.  STRETCH - Low Trunk Rotation  Lie on a firm bed or floor. Keeping your legs in front of you, bend your knees so they are both pointed toward the ceiling and your feet are flat on the floor.  Extend your arms out to the side. This will stabilize your upper body by keeping your shoulders in contact with the floor.  Gently and slowly drop both knees together to one side until you feel a gentle stretch in your lower back. Hold for __________ seconds.  Tense your stomach muscles to support your lower back as you bring your knees back to the starting position. Repeat the exercise to the other  side. Repeat __________ times. Complete this exercise __________ times per day  EXTENSION RANGE OF MOTION AND FLEXIBILITY EXERCISES: STRETCH - Extension, Prone on Elbows   Lie on your stomach on the floor, a bed will be too soft. Place your palms about shoulder width apart and at the height of your head.  Place your elbows under your shoulders. If this is too painful, stack pillows under your chest.  Allow your body to relax so that your hips drop lower and make contact more completely with the floor.  Hold this position for __________ seconds.  Slowly return to lying flat on the floor. Repeat __________ times. Complete this exercise __________ times per day.  RANGE OF MOTION - Extension, Prone Press Ups  Lie on your stomach on the floor, a bed will be too soft. Place your palms about shoulder width apart and at the height of your head.  Keeping your back as relaxed as possible, slowly straighten your elbows while keeping your hips on the floor. You may adjust the placement of your hands to maximize your comfort. As you gain motion, your hands will come more underneath your shoulders.  Hold this position __________ seconds.  Slowly return to lying flat on the floor. Repeat __________ times. Complete this exercise __________ times per day.  RANGE OF MOTION- Quadruped, Neutral Spine   Assume a hands and knees position on a firm surface. Keep your hands under your shoulders and your knees under your hips. You may place padding under your knees for comfort.  Drop your head and point your tail bone toward the ground below you. This will round out your lower back like an angry cat. Hold this position for __________ seconds.  Slowly lift your head and release your tail bone so that your back sags into a large arch, like an old horse.  Hold this position for __________ seconds.  Repeat this until you feel limber in your lower back.  Now, find your "sweet spot." This will be the most  comfortable position somewhere  between the two previous positions. This is your neutral spine. Once you have found this position, tense your stomach muscles to support your lower back.  Hold this position for __________ seconds. Repeat __________ times. Complete this exercise __________ times per day.  STRENGTHENING EXERCISES - Low Back Strain These exercises may help you when beginning to rehabilitate your injury. These exercises should be done near your "sweet spot." This is the neutral, low-back arch, somewhere between fully rounded and fully arched, that is your least painful position. When performed in this safe range of motion, these exercises can be used for people who have either a flexion or extension based injury. These exercises may resolve your symptoms with or without further involvement from your physician, physical therapist or athletic trainer. While completing these exercises, remember:   Muscles can gain both the endurance and the strength needed for everyday activities through controlled exercises.  Complete these exercises as instructed by your physician, physical therapist or athletic trainer. Increase the resistance and repetitions only as guided.  You may experience muscle soreness or fatigue, but the pain or discomfort you are trying to eliminate should never worsen during these exercises. If this pain does worsen, stop and make certain you are following the directions exactly. If the pain is still present after adjustments, discontinue the exercise until you can discuss the trouble with your caregiver. STRENGTHENING - Deep Abdominals, Pelvic Tilt  Lie on a firm bed or floor. Keeping your legs in front of you, bend your knees so they are both pointed toward the ceiling and your feet are flat on the floor.  Tense your lower abdominal muscles to press your lower back into the floor. This motion will rotate your pelvis so that your tail bone is scooping upwards rather than  pointing at your feet or into the floor.  With a gentle tension and even breathing, hold this position for __________ seconds. Repeat __________ times. Complete this exercise __________ times per day.  STRENGTHENING - Abdominals, Crunches   Lie on a firm bed or floor. Keeping your legs in front of you, bend your knees so they are both pointed toward the ceiling and your feet are flat on the floor. Cross your arms over your chest.  Slightly tip your chin down without bending your neck.  Tense your abdominals and slowly lift your trunk high enough to just clear your shoulder blades. Lifting higher can put excessive stress on the lower back and does not further strengthen your abdominal muscles.  Control your return to the starting position. Repeat __________ times. Complete this exercise __________ times per day.  STRENGTHENING - Quadruped, Opposite UE/LE Lift   Assume a hands and knees position on a firm surface. Keep your hands under your shoulders and your knees under your hips. You may place padding under your knees for comfort.  Find your neutral spine and gently tense your abdominal muscles so that you can maintain this position. Your shoulders and hips should form a rectangle that is parallel with the floor and is not twisted.  Keeping your trunk steady, lift your right hand no higher than your shoulder and then your left leg no higher than your hip. Make sure you are not holding your breath. Hold this position __________ seconds.  Continuing to keep your abdominal muscles tense and your back steady, slowly return to your starting position. Repeat with the opposite arm and leg. Repeat __________ times. Complete this exercise __________ times per day.  STRENGTHENING - Lower Abdominals,  Double Knee Lift  Lie on a firm bed or floor. Keeping your legs in front of you, bend your knees so they are both pointed toward the ceiling and your feet are flat on the floor.  Tense your abdominal  muscles to brace your lower back and slowly lift both of your knees until they come over your hips. Be certain not to hold your breath.  Hold __________ seconds. Using your abdominal muscles, return to the starting position in a slow and controlled manner. Repeat __________ times. Complete this exercise __________ times per day.  POSTURE AND BODY MECHANICS CONSIDERATIONS - Low Back Strain Keeping correct posture when sitting, standing or completing your activities will reduce the stress put on different body tissues, allowing injured tissues a chance to heal and limiting painful experiences. The following are general guidelines for improved posture. Your physician or physical therapist will provide you with any instructions specific to your needs. While reading these guidelines, remember:  The exercises prescribed by your provider will help you have the flexibility and strength to maintain correct postures.  The correct posture provides the best environment for your joints to work. All of your joints have less wear and tear when properly supported by a spine with good posture. This means you will experience a healthier, less painful body.  Correct posture must be practiced with all of your activities, especially prolonged sitting and standing. Correct posture is as important when doing repetitive low-stress activities (typing) as it is when doing a single heavy-load activity (lifting). RESTING POSITIONS Consider which positions are most painful for you when choosing a resting position. If you have pain with flexion-based activities (sitting, bending, stooping, squatting), choose a position that allows you to rest in a less flexed posture. You would want to avoid curling into a fetal position on your side. If your pain worsens with extension-based activities (prolonged standing, working overhead), avoid resting in an extended position such as sleeping on your stomach. Most people will find more comfort  when they rest with their spine in a more neutral position, neither too rounded nor too arched. Lying on a non-sagging bed on your side with a pillow between your knees, or on your back with a pillow under your knees will often provide some relief. Keep in mind, being in any one position for a prolonged period of time, no matter how correct your posture, can still lead to stiffness. PROPER SITTING POSTURE In order to minimize stress and discomfort on your spine, you must sit with correct posture. Sitting with good posture should be effortless for a healthy body. Returning to good posture is a gradual process. Many people can work toward this most comfortably by using various supports until they have the flexibility and strength to maintain this posture on their own. When sitting with proper posture, your ears will fall over your shoulders and your shoulders will fall over your hips. You should use the back of the chair to support your upper back. Your lower back will be in a neutral position, just slightly arched. You may place a small pillow or folded towel at the base of your lower back for support.  When working at a desk, create an environment that supports good, upright posture. Without extra support, muscles tire, which leads to excessive strain on joints and other tissues. Keep these recommendations in mind: CHAIR:  A chair should be able to slide under your desk when your back makes contact with the back of the chair. This  allows you to work closely.  The chair's height should allow your eyes to be level with the upper part of your monitor and your hands to be slightly lower than your elbows. BODY POSITION  Your feet should make contact with the floor. If this is not possible, use a foot rest.  Keep your ears over your shoulders. This will reduce stress on your neck and lower back. INCORRECT SITTING POSTURES  If you are feeling tired and unable to assume a healthy sitting posture, do not  slouch or slump. This puts excessive strain on your back tissues, causing more damage and pain. Healthier options include:  Using more support, like a lumbar pillow.  Switching tasks to something that requires you to be upright or walking.  Talking a brief walk.  Lying down to rest in a neutral-spine position. PROLONGED STANDING WHILE SLIGHTLY LEANING FORWARD  When completing a task that requires you to lean forward while standing in one place for a long time, place either foot up on a stationary 2-4 inch high object to help maintain the best posture. When both feet are on the ground, the lower back tends to lose its slight inward curve. If this curve flattens (or becomes too large), then the back and your other joints will experience too much stress, tire more quickly, and can cause pain. CORRECT STANDING POSTURES Proper standing posture should be assumed with all daily activities, even if they only take a few moments, like when brushing your teeth. As in sitting, your ears should fall over your shoulders and your shoulders should fall over your hips. You should keep a slight tension in your abdominal muscles to brace your spine. Your tailbone should point down to the ground, not behind your body, resulting in an over-extended swayback posture.  INCORRECT STANDING POSTURES  Common incorrect standing postures include a forward head, locked knees and/or an excessive swayback. WALKING Walk with an upright posture. Your ears, shoulders and hips should all line-up. PROLONGED ACTIVITY IN A FLEXED POSITION When completing a task that requires you to bend forward at your waist or lean over a low surface, try to find a way to stabilize 3 out of 4 of your limbs. You can place a hand or elbow on your thigh or rest a knee on the surface you are reaching across. This will provide you more stability so that your muscles do not fatigue as quickly. By keeping your knees relaxed, or slightly bent, you will also  reduce stress across your lower back. CORRECT LIFTING TECHNIQUES DO :   Assume a wide stance. This will provide you more stability and the opportunity to get as close as possible to the object which you are lifting.  Tense your abdominals to brace your spine. Bend at the knees and hips. Keeping your back locked in a neutral-spine position, lift using your leg muscles. Lift with your legs, keeping your back straight.  Test the weight of unknown objects before attempting to lift them.  Try to keep your elbows locked down at your sides in order get the best strength from your shoulders when carrying an object.  Always ask for help when lifting heavy or awkward objects. INCORRECT LIFTING TECHNIQUES DO NOT:   Lock your knees when lifting, even if it is a small object.  Bend and twist. Pivot at your feet or move your feet when needing to change directions.  Assume that you can safely pick up even a paper clip without proper posture. Document  Released: 11/29/2005 Document Revised: 02/21/2012 Document Reviewed: 03/13/2009 [facility] Patient Information 2015 [place], [place]. This information is not intended to replace advice given to you by your health care provider. Make sure you discuss any questions you have with your health care provider. Low Back Strain with Rehab A strain is an injury in which a tendon or muscle is torn. The muscles and tendons of the lower back are vulnerable to strains. However, these muscles and tendons are very strong and require a great force to be injured. Strains are classified into three categories. Grade 1 strains cause pain, but the tendon is not lengthened. Grade 2 strains include a lengthened ligament, due to the ligament being stretched or partially ruptured. With grade 2 strains there is still function, although the function may be decreased. Grade 3 strains involve a complete tear of the tendon or muscle, and function is usually impaired. SYMPTOMS   Pain in the  lower back.  Pain that affects one side more than the other.  Pain that gets worse with movement and may be felt in the hip, buttocks, or back of the thigh.  Muscle spasms of the muscles in the back.  Swelling along the muscles of the back.  Loss of strength of the back muscles.  Crackling sound (crepitation) when the muscles are touched. CAUSES  Lower back strains occur when a force is placed on the muscles or tendons that is greater than they can handle. Common causes of injury include:  Prolonged overuse of the muscle-tendon units in the lower back, usually from incorrect posture.  A single violent injury or force applied to the back. RISK INCREASES WITH:  Sports that involve twisting forces on the spine or a lot of bending at the waist (football, rugby, weightlifting, bowling, golf, tennis, speed skating, racquetball, swimming, running, gymnastics, diving).  Poor strength and flexibility.  Failure to warm up properly before activity.  Family history of lower back pain or disk disorders.  Previous back injury or surgery (especially fusion).  Poor posture with lifting, especially heavy objects.  Prolonged sitting, especially with poor posture. PREVENTION   Learn and use proper posture when sitting or lifting (maintain proper posture when sitting, lift using the knees and legs, not at the waist).  Warm up and stretch properly before activity.  Allow for adequate recovery between workouts.  Maintain physical fitness:  Strength, flexibility, and endurance.  Cardiovascular fitness. PROGNOSIS  If treated properly, lower back strains usually heal within 6 weeks. RELATED COMPLICATIONS   Recurring symptoms, resulting in a chronic problem.  Chronic inflammation, scarring, and partial muscle-tendon tear.  Delayed healing or resolution of symptoms.  Prolonged disability. TREATMENT  Treatment first involves the use of ice and medicine, to reduce pain and inflammation.  The use of strengthening and stretching exercises may help reduce pain with activity. These exercises may be performed at home or with a therapist. Severe injuries may require referral to a therapist for further evaluation and treatment, such as ultrasound. Your caregiver may advise that you wear a back brace or corset, to help reduce pain and discomfort. Often, prolonged bed rest results in greater harm then benefit. Corticosteroid injections may be recommended. However, these should be reserved for the most serious cases. It is important to avoid using your back when lifting objects. At night, sleep on your back on a firm mattress with a pillow placed under your knees. If non-surgical treatment is unsuccessful, surgery may be needed.  MEDICATION   If pain medicine is  needed, nonsteroidal anti-inflammatory medicines (aspirin and ibuprofen), or other minor pain relievers (acetaminophen), are often advised.  Do not take pain medicine for 7 days before surgery.  Prescription pain relievers may be given, if your caregiver thinks they are needed. Use only as directed and only as much as you need.  Ointments applied to the skin may be helpful.  Corticosteroid injections may be given by your caregiver. These injections should be reserved for the most serious cases, because they may only be given a certain number of times. HEAT AND COLD  Cold treatment (icing) should be applied for 10 to 15 minutes every 2 to 3 hours for inflammation and pain, and immediately after activity that aggravates your symptoms. Use ice packs or an ice massage.  Heat treatment may be used before performing stretching and strengthening activities prescribed by your caregiver, physical therapist, or athletic trainer. Use a heat pack or a warm water soak. SEEK MEDICAL CARE IF:   Symptoms get worse or do not improve in 2 to 4 weeks, despite treatment.  You develop numbness, weakness, or loss of bowel or bladder function.  New,  unexplained symptoms develop. (Drugs used in treatment may produce side effects.) EXERCISES  RANGE OF MOTION (ROM) AND STRETCHING EXERCISES - Low Back Strain Most people with lower back pain will find that their symptoms get worse with excessive bending forward (flexion) or arching at the lower back (extension). The exercises which will help resolve your symptoms will focus on the opposite motion.  Your physician, physical therapist or athletic trainer will help you determine which exercises will be most helpful to resolve your lower back pain. Do not complete any exercises without first consulting with your caregiver. Discontinue any exercises which make your symptoms worse until you speak to your caregiver.  If you have pain, numbness or tingling which travels down into your buttocks, leg or foot, the goal of the therapy is for these symptoms to move closer to your back and eventually resolve. Sometimes, these leg symptoms will get better, but your lower back pain may worsen. This is typically an indication of progress in your rehabilitation. Be very alert to any changes in your symptoms and the activities in which you participated in the 24 hours prior to the change. Sharing this information with your caregiver will allow him/her to most efficiently treat your condition.  These exercises may help you when beginning to rehabilitate your injury. Your symptoms may resolve with or without further involvement from your physician, physical therapist or athletic trainer. While completing these exercises, remember:  Restoring tissue flexibility helps normal motion to return to the joints. This allows healthier, less painful movement and activity.  An effective stretch should be held for at least 30 seconds.  A stretch should never be painful. You should only feel a gentle lengthening or release in the stretched tissue. FLEXION RANGE OF MOTION AND STRETCHING EXERCISES: STRETCH - Flexion, Single Knee to  Chest   Lie on a firm bed or floor with both legs extended in front of you.  Keeping one leg in contact with the floor, bring your opposite knee to your chest. Hold your leg in place by either grabbing behind your thigh or at your knee.  Pull until you feel a gentle stretch in your lower back. Hold __________ seconds.  Slowly release your grasp and repeat the exercise with the opposite side. Repeat __________ times. Complete this exercise __________ times per day.  STRETCH - Flexion, Double Knee to Chest  Lie on a firm bed or floor with both legs extended in front of you.  Keeping one leg in contact with the floor, bring your opposite knee to your chest.  Tense your stomach muscles to support your back and then lift your other knee to your chest. Hold your legs in place by either grabbing behind your thighs or at your knees.  Pull both knees toward your chest until you feel a gentle stretch in your lower back. Hold __________ seconds.  Tense your stomach muscles and slowly return one leg at a time to the floor. Repeat __________ times. Complete this exercise __________ times per day.  STRETCH - Low Trunk Rotation  Lie on a firm bed or floor. Keeping your legs in front of you, bend your knees so they are both pointed toward the ceiling and your feet are flat on the floor.  Extend your arms out to the side. This will stabilize your upper body by keeping your shoulders in contact with the floor.  Gently and slowly drop both knees together to one side until you feel a gentle stretch in your lower back. Hold for __________ seconds.  Tense your stomach muscles to support your lower back as you bring your knees back to the starting position. Repeat the exercise to the other side. Repeat __________ times. Complete this exercise __________ times per day  EXTENSION RANGE OF MOTION AND FLEXIBILITY EXERCISES: STRETCH - Extension, Prone on Elbows   Lie on your stomach on the floor, a bed will  be too soft. Place your palms about shoulder width apart and at the height of your head.  Place your elbows under your shoulders. If this is too painful, stack pillows under your chest.  Allow your body to relax so that your hips drop lower and make contact more completely with the floor.  Hold this position for __________ seconds.  Slowly return to lying flat on the floor. Repeat __________ times. Complete this exercise __________ times per day.  RANGE OF MOTION - Extension, Prone Press Ups  Lie on your stomach on the floor, a bed will be too soft. Place your palms about shoulder width apart and at the height of your head.  Keeping your back as relaxed as possible, slowly straighten your elbows while keeping your hips on the floor. You may adjust the placement of your hands to maximize your comfort. As you gain motion, your hands will come more underneath your shoulders.  Hold this position __________ seconds.  Slowly return to lying flat on the floor. Repeat __________ times. Complete this exercise __________ times per day.  RANGE OF MOTION- Quadruped, Neutral Spine   Assume a hands and knees position on a firm surface. Keep your hands under your shoulders and your knees under your hips. You may place padding under your knees for comfort.  Drop your head and point your tail bone toward the ground below you. This will round out your lower back like an angry cat. Hold this position for __________ seconds.  Slowly lift your head and release your tail bone so that your back sags into a large arch, like an old horse.  Hold this position for __________ seconds.  Repeat this until you feel limber in your lower back.  Now, find your "sweet spot." This will be the most comfortable position somewhere between the two previous positions. This is your neutral spine. Once you have found this position, tense your stomach muscles to support your lower back.  Hold  this position for __________  seconds. Repeat __________ times. Complete this exercise __________ times per day.  STRENGTHENING EXERCISES - Low Back Strain These exercises may help you when beginning to rehabilitate your injury. These exercises should be done near your "sweet spot." This is the neutral, low-back arch, somewhere between fully rounded and fully arched, that is your least painful position. When performed in this safe range of motion, these exercises can be used for people who have either a flexion or extension based injury. These exercises may resolve your symptoms with or without further involvement from your physician, physical therapist or athletic trainer. While completing these exercises, remember:   Muscles can gain both the endurance and the strength needed for everyday activities through controlled exercises.  Complete these exercises as instructed by your physician, physical therapist or athletic trainer. Increase the resistance and repetitions only as guided.  You may experience muscle soreness or fatigue, but the pain or discomfort you are trying to eliminate should never worsen during these exercises. If this pain does worsen, stop and make certain you are following the directions exactly. If the pain is still present after adjustments, discontinue the exercise until you can discuss the trouble with your caregiver. STRENGTHENING - Deep Abdominals, Pelvic Tilt  Lie on a firm bed or floor. Keeping your legs in front of you, bend your knees so they are both pointed toward the ceiling and your feet are flat on the floor.  Tense your lower abdominal muscles to press your lower back into the floor. This motion will rotate your pelvis so that your tail bone is scooping upwards rather than pointing at your feet or into the floor.  With a gentle tension and even breathing, hold this position for __________ seconds. Repeat __________ times. Complete this exercise __________ times per day.  STRENGTHENING -  Abdominals, Crunches   Lie on a firm bed or floor. Keeping your legs in front of you, bend your knees so they are both pointed toward the ceiling and your feet are flat on the floor. Cross your arms over your chest.  Slightly tip your chin down without bending your neck.  Tense your abdominals and slowly lift your trunk high enough to just clear your shoulder blades. Lifting higher can put excessive stress on the lower back and does not further strengthen your abdominal muscles.  Control your return to the starting position. Repeat __________ times. Complete this exercise __________ times per day.  STRENGTHENING - Quadruped, Opposite UE/LE Lift   Assume a hands and knees position on a firm surface. Keep your hands under your shoulders and your knees under your hips. You may place padding under your knees for comfort.  Find your neutral spine and gently tense your abdominal muscles so that you can maintain this position. Your shoulders and hips should form a rectangle that is parallel with the floor and is not twisted.  Keeping your trunk steady, lift your right hand no higher than your shoulder and then your left leg no higher than your hip. Make sure you are not holding your breath. Hold this position __________ seconds.  Continuing to keep your abdominal muscles tense and your back steady, slowly return to your starting position. Repeat with the opposite arm and leg. Repeat __________ times. Complete this exercise __________ times per day.  STRENGTHENING - Lower Abdominals, Double Knee Lift  Lie on a firm bed or floor. Keeping your legs in front of you, bend your knees so they are both pointed  toward the ceiling and your feet are flat on the floor.  Tense your abdominal muscles to brace your lower back and slowly lift both of your knees until they come over your hips. Be certain not to hold your breath.  Hold __________ seconds. Using your abdominal muscles, return to the starting  position in a slow and controlled manner. Repeat __________ times. Complete this exercise __________ times per day.  POSTURE AND BODY MECHANICS CONSIDERATIONS - Low Back Strain Keeping correct posture when sitting, standing or completing your activities will reduce the stress put on different body tissues, allowing injured tissues a chance to heal and limiting painful experiences. The following are general guidelines for improved posture. Your physician or physical therapist will provide you with any instructions specific to your needs. While reading these guidelines, remember:  The exercises prescribed by your provider will help you have the flexibility and strength to maintain correct postures.  The correct posture provides the best environment for your joints to work. All of your joints have less wear and tear when properly supported by a spine with good posture. This means you will experience a healthier, less painful body.  Correct posture must be practiced with all of your activities, especially prolonged sitting and standing. Correct posture is as important when doing repetitive low-stress activities (typing) as it is when doing a single heavy-load activity (lifting). RESTING POSITIONS Consider which positions are most painful for you when choosing a resting position. If you have pain with flexion-based activities (sitting, bending, stooping, squatting), choose a position that allows you to rest in a less flexed posture. You would want to avoid curling into a fetal position on your side. If your pain worsens with extension-based activities (prolonged standing, working overhead), avoid resting in an extended position such as sleeping on your stomach. Most people will find more comfort when they rest with their spine in a more neutral position, neither too rounded nor too arched. Lying on a non-sagging bed on your side with a pillow between your knees, or on your back with a pillow under your knees  will often provide some relief. Keep in mind, being in any one position for a prolonged period of time, no matter how correct your posture, can still lead to stiffness. PROPER SITTING POSTURE In order to minimize stress and discomfort on your spine, you must sit with correct posture. Sitting with good posture should be effortless for a healthy body. Returning to good posture is a gradual process. Many people can work toward this most comfortably by using various supports until they have the flexibility and strength to maintain this posture on their own. When sitting with proper posture, your ears will fall over your shoulders and your shoulders will fall over your hips. You should use the back of the chair to support your upper back. Your lower back will be in a neutral position, just slightly arched. You may place a small pillow or folded towel at the base of your lower back for support.  When working at a desk, create an environment that supports good, upright posture. Without extra support, muscles tire, which leads to excessive strain on joints and other tissues. Keep these recommendations in mind: CHAIR:  A chair should be able to slide under your desk when your back makes contact with the back of the chair. This allows you to work closely.  The chair's height should allow your eyes to be level with the upper part of your monitor and your hands  to be slightly lower than your elbows. BODY POSITION  Your feet should make contact with the floor. If this is not possible, use a foot rest.  Keep your ears over your shoulders. This will reduce stress on your neck and lower back. INCORRECT SITTING POSTURES  If you are feeling tired and unable to assume a healthy sitting posture, do not slouch or slump. This puts excessive strain on your back tissues, causing more damage and pain. Healthier options include:  Using more support, like a lumbar pillow.  Switching tasks to something that requires you to  be upright or walking.  Talking a brief walk.  Lying down to rest in a neutral-spine position. PROLONGED STANDING WHILE SLIGHTLY LEANING FORWARD  When completing a task that requires you to lean forward while standing in one place for a long time, place either foot up on a stationary 2-4 inch high object to help maintain the best posture. When both feet are on the ground, the lower back tends to lose its slight inward curve. If this curve flattens (or becomes too large), then the back and your other joints will experience too much stress, tire more quickly, and can cause pain. CORRECT STANDING POSTURES Proper standing posture should be assumed with all daily activities, even if they only take a few moments, like when brushing your teeth. As in sitting, your ears should fall over your shoulders and your shoulders should fall over your hips. You should keep a slight tension in your abdominal muscles to brace your spine. Your tailbone should point down to the ground, not behind your body, resulting in an over-extended swayback posture.  INCORRECT STANDING POSTURES  Common incorrect standing postures include a forward head, locked knees and/or an excessive swayback. WALKING Walk with an upright posture. Your ears, shoulders and hips should all line-up. PROLONGED ACTIVITY IN A FLEXED POSITION When completing a task that requires you to bend forward at your waist or lean over a low surface, try to find a way to stabilize 3 out of 4 of your limbs. You can place a hand or elbow on your thigh or rest a knee on the surface you are reaching across. This will provide you more stability so that your muscles do not fatigue as quickly. By keeping your knees relaxed, or slightly bent, you will also reduce stress across your lower back. CORRECT LIFTING TECHNIQUES DO :   Assume a wide stance. This will provide you more stability and the opportunity to get as close as possible to the object which you are  lifting.  Tense your abdominals to brace your spine. Bend at the knees and hips. Keeping your back locked in a neutral-spine position, lift using your leg muscles. Lift with your legs, keeping your back straight.  Test the weight of unknown objects before attempting to lift them.  Try to keep your elbows locked down at your sides in order get the best strength from your shoulders when carrying an object.  Always ask for help when lifting heavy or awkward objects. INCORRECT LIFTING TECHNIQUES DO NOT:   Lock your knees when lifting, even if it is a small object.  Bend and twist. Pivot at your feet or move your feet when needing to change directions.  Assume that you can safely pick up even a paper clip without proper posture. Document Released: 11/29/2005 Document Revised: 02/21/2012 Document Reviewed: 03/13/2009 [facility] Patient Information 2015 [place], [place]. This information is not intended to replace advice given to you by  your health care provider. Make sure you discuss any questions you have with your health care provider.  

## 2015-09-10 ENCOUNTER — Other Ambulatory Visit: Payer: Self-pay | Admitting: Internal Medicine

## 2015-09-16 ENCOUNTER — Telehealth: Payer: Self-pay | Admitting: Internal Medicine

## 2015-09-16 NOTE — Telephone Encounter (Signed)
Pt's daughter called in stating they were just in a month ago and wants to know if its necessary for the routine physical on Friday... Pt also needs dexa scan. Pt has already had the shingles vaccine even though Mychart shows it overdue.

## 2015-09-16 NOTE — Telephone Encounter (Signed)
Misty Alvarado, pt is not due to come back till end of Nov according to last office visit with Dr.K. Please call pt and reschedule and cancel upcoming appt. Pt is due for Flu and pneumonia shots. I need to know date of Shingles vaccine so I can update chart. Thanks

## 2015-09-19 ENCOUNTER — Ambulatory Visit: Payer: Medicare Other | Admitting: Internal Medicine

## 2015-09-19 DIAGNOSIS — Z0289 Encounter for other administrative examinations: Secondary | ICD-10-CM

## 2015-09-19 NOTE — Progress Notes (Unsigned)
   Subjective:    Patient ID: Misty Alvarado, female    DOB: 08-Jun-1933, 81 y.o.   MRN: [id number]  HPI    Review of Systems     Objective:   Physical Exam        Assessment & Plan:

## 2015-10-23 ENCOUNTER — Ambulatory Visit
Admission: RE | Admit: 2015-10-23 | Discharge: 2015-10-23 | Disposition: A | Discharge status: Home / Self Care | Payer: Medicare Other | Source: Ambulatory Visit | Attending: Radiation Oncology | Admitting: Radiation Oncology

## 2015-10-23 ENCOUNTER — Telehealth: Payer: Self-pay | Admitting: Oncology

## 2015-10-23 NOTE — Telephone Encounter (Signed)
Left a message for [facility] regarding her appointment with Dr. Sondra Come today.  Requested a return call.

## 2015-10-31 ENCOUNTER — Telehealth: Payer: Self-pay | Admitting: Oncology

## 2015-10-31 ENCOUNTER — Emergency Department (HOSPITAL_COMMUNITY)
Admission: EM | Admit: 2015-10-31 | Discharge: 2015-11-01 | Disposition: A | Discharge status: Home / Self Care | Payer: Medicare Other | Attending: Emergency Medicine | Admitting: Emergency Medicine

## 2015-10-31 DIAGNOSIS — Z8542 Personal history of malignant neoplasm of other parts of uterus: Secondary | ICD-10-CM | POA: Insufficient documentation

## 2015-10-31 DIAGNOSIS — I10 Essential (primary) hypertension: Secondary | ICD-10-CM | POA: Insufficient documentation

## 2015-10-31 DIAGNOSIS — E785 Hyperlipidemia, unspecified: Secondary | ICD-10-CM | POA: Insufficient documentation

## 2015-10-31 DIAGNOSIS — M170 Bilateral primary osteoarthritis of knee: Secondary | ICD-10-CM | POA: Diagnosis not present

## 2015-10-31 DIAGNOSIS — M549 Dorsalgia, unspecified: Secondary | ICD-10-CM | POA: Diagnosis not present

## 2015-10-31 DIAGNOSIS — K8020 Calculus of gallbladder without cholecystitis without obstruction: Secondary | ICD-10-CM | POA: Diagnosis not present

## 2015-10-31 DIAGNOSIS — Z87440 Personal history of urinary (tract) infections: Secondary | ICD-10-CM | POA: Insufficient documentation

## 2015-10-31 DIAGNOSIS — Z923 Personal history of irradiation: Secondary | ICD-10-CM | POA: Diagnosis not present

## 2015-10-31 DIAGNOSIS — R1030 Lower abdominal pain, unspecified: Secondary | ICD-10-CM | POA: Diagnosis not present

## 2015-10-31 DIAGNOSIS — Z79899 Other long term (current) drug therapy: Secondary | ICD-10-CM | POA: Insufficient documentation

## 2015-10-31 DIAGNOSIS — R109 Unspecified abdominal pain: Secondary | ICD-10-CM

## 2015-10-31 DIAGNOSIS — K219 Gastro-esophageal reflux disease without esophagitis: Secondary | ICD-10-CM | POA: Insufficient documentation

## 2015-10-31 DIAGNOSIS — F0390 Unspecified dementia without behavioral disturbance: Secondary | ICD-10-CM | POA: Insufficient documentation

## 2015-10-31 DIAGNOSIS — Z87442 Personal history of urinary calculi: Secondary | ICD-10-CM | POA: Insufficient documentation

## 2015-10-31 LAB — URINALYSIS, ROUTINE W REFLEX MICROSCOPIC
GLUCOSE, UA: NEGATIVE mg/dL
HGB URINE DIPSTICK: NEGATIVE
KETONES UR: NEGATIVE mg/dL
Nitrite: NEGATIVE
PH: 5.5 (ref 5.0–8.0)
Protein, ur: NEGATIVE mg/dL
Specific Gravity, Urine: 1.034 — ABNORMAL HIGH (ref 1.005–1.030)

## 2015-10-31 LAB — CBC WITH DIFFERENTIAL/PLATELET
Basophils Absolute: 0 10*3/uL (ref 0.0–0.1)
Basophils Relative: 1 %
EOS ABS: 0.3 10*3/uL (ref 0.0–0.7)
Eosinophils Relative: 5 %
HCT: 37.5 % (ref 36.0–46.0)
HEMOGLOBIN: 13.2 g/dL (ref 12.0–15.0)
LYMPHS ABS: 2 10*3/uL (ref 0.7–4.0)
Lymphocytes Relative: 36 %
MCH: 31.5 pg (ref 26.0–34.0)
MCHC: 35.2 g/dL (ref 30.0–36.0)
MCV: 89.5 fL (ref 78.0–100.0)
Monocytes Absolute: 0.6 10*3/uL (ref 0.1–1.0)
Monocytes Relative: 10 %
Neutro Abs: 2.6 10*3/uL (ref 1.7–7.7)
Neutrophils Relative %: 48 %
Platelets: 229 10*3/uL (ref 150–400)
RBC: 4.19 MIL/uL (ref 3.87–5.11)
RDW: 12.4 % (ref 11.5–15.5)
WBC: 5.5 10*3/uL (ref 4.0–10.5)

## 2015-10-31 LAB — URINE MICROSCOPIC-ADD ON

## 2015-10-31 NOTE — Telephone Encounter (Signed)
Called [phone] was advised there is no Freddy Finner at this number. S/w pt's daughter Bonnita Nasuti @ [phone], gave appt 04/26/16 @ 10am.

## 2015-10-31 NOTE — ED Provider Notes (Signed)
By signing my name below, I, Forrestine Him, attest that this documentation has been prepared under the direction and in the presence of [place], DO.  Electronically Signed: Forrestine Him, ED Scribe. 10/31/2015. 12:00 AM.   TIME SEEN: 11:54 PM   CHIEF COMPLAINT:  Chief Complaint  Patient presents with  . Back Pain     HPI:  HPI Comments: Misty Alvarado is a 81 y.o. female with a PMHx of dementia, recurrent UTIs, endometrial cancer, and kidney stones who presents to the Emergency Department complaining of constant, ongoing back pain x 2-3 days. No recent trauma or falls. No aggravating or alleviating factors at this time. No OTC medications or home remedies attempted prior to arrival. No recent fever, chills, nausea, vomiting, diarrhea, abdominal pain, chest pain, or shortness of breath. No history of blood clots. Last kidney stones 10-15 years ago. Last radiation treatment 11/19/13. PSHx includes total hysterectomy.  PCP: Nyoka Cowden, MD    ROS: Level V caveat for dementia  PAST MEDICAL HISTORY/PAST SURGICAL HISTORY:  Past Medical History  Diagnosis Date  . ALLERGIC RHINITIS 10/03/2007  . HYPERLIPIDEMIA 10/03/2007  . HYPERTENSION 10/03/2007  . Ulcer     gastric  . GERD (gastroesophageal reflux disease)     "ONCE IN A WHILE"  TUMS IF NEEDED  . Arthritis     KNEES  . Endometrial cancer     new diagnosis 03/02/13  . History of radiation therapy 10/22/13, 10/29/13, 11/05/13, 11/12/13, 11/19/13    proximal vagina 30 gray    MEDICATIONS:  Prior to Admission medications   Medication Sig Start Date End Date Taking? Authorizing Provider  acetaminophen (TYLENOL) 650 MG CR tablet Take 650 mg by mouth every 8 (eight) hours as needed for pain.    Historical Provider, MD  beta carotene w/minerals (OCUVITE) tablet Take 1 tablet by mouth daily.    Historical Provider, MD  Cholecalciferol (VITAMIN D-3) 1000 UNITS CAPS Take 1 capsule by mouth daily.    Historical Provider, MD   co-enzyme Q-10 30 MG capsule Take 30 mg by mouth daily.    Historical Provider, MD  donepezil (ARICEPT) 5 MG tablet Take 1 tablet (5 mg total) by mouth at bedtime. 08/07/15   Marletta Lor, MD  ibuprofen (ADVIL,MOTRIN) 200 MG tablet Take 200 mg by mouth every 6 (six) hours as needed.    Historical Provider, MD  losartan-hydrochlorothiazide (HYZAAR) 100-12.5 MG per tablet Take 1 tablet by mouth daily. 09/19/14   Marletta Lor, MD  magnesium 30 MG tablet Take 30 mg by mouth daily.     Historical Provider, MD  pyridOXINE (VITAMIN B-6) 100 MG tablet Take 100 mg by mouth daily.    Historical Provider, MD  traMADol (ULTRAM) 50 MG tablet TAKE 1 TABLET BY MOUTH EVERY 6 HOURS AS NEEDED 09/12/15   Marletta Lor, MD  vitamin B-12 (CYANOCOBALAMIN) 1000 MCG tablet Take 1,000 mcg by mouth daily.    Historical Provider, MD    ALLERGIES:  Allergies  Allergen Reactions  . Dexamethasone Other (See Comments)    Mood disturbance, psychosis  . Rocephin [Ceftriaxone Sodium In Dextrose] Rash    SOCIAL HISTORY:  Social History  Substance Use Topics  . Smoking status: Never Smoker   . Smokeless tobacco: Never Used  . Alcohol Use: No    FAMILY HISTORY: Family History  Problem Relation Age of Onset  . Arthritis Mother   . Colon cancer Neg Hx   . Cancer Sister  1 deceased from lung cancer  . Cancer Brother     2 deceased from lung cancer    EXAM: BP 143/85 mmHg  Pulse 96  Temp(Src) 97.9 F (36.6 C)  Resp 24  SpO2 97% CONSTITUTIONAL: Alert and oriented and responds appropriately to questions. elderly; well-nourished; appears uncomfortable, non toxic, afebrile  HEAD: Normocephalic EYES: Conjunctivae clear, PERRL ENT: normal nose; no rhinorrhea; moist mucous membranes; pharynx without lesions noted NECK: Supple, no meningismus, no LAD  CARD: RRR; S1 and S2 appreciated; no murmurs, no clicks, no rubs, no gallops RESP: Normal chest excursion without splinting or tachypnea; breath  sounds clear and equal bilaterally; no wheezes, no rhonchi, no rales, no hypoxia or respiratory distress, speaking full sentences ABD/GI: Normal bowel sounds; non-distended; soft, non-tender, no rebound, no guarding, no peritoneal signs, negative murphy's sign, no tenderness at McBurney's point BACK:  The back appears normal and is non-tender to palpation, there is no CVA tenderness, no midline spinal tenderness or step-off or deformity, no rash or other lesions noted EXT: Normal ROM in all joints; non-tender to palpation; no edema; normal capillary refill; no cyanosis, no calf tenderness or swelling    SKIN: Normal color for age and race; warm NEURO: Moves all extremities equally, sensation to light touch intact diffusely, cranial nerves II through XII intact PSYCH: The patient's mood and manner are appropriate. Grooming and personal hygiene are appropriate.  MEDICAL DECISION MAKING: Patient here with complains of right flank pain. Points also to the right upper quadrant but has no tenderness in this area and a negative Murphy sign. No fevers or cough. Denies chest pain or shortness of breath. Appears neurologically intact. Differential diagnosis includes kidney stones, pyelonephritis, less likely pneumonia, cholelithiasis, cholecystitis. We'll obtain labs, CT of the abdomen and pelvis. We'll give pain medication. Daughter is requesting that we only give her tramadol. She does not why anything stronger.  ED PROGRESS: Patient's labs are unremarkable other than mildly elevated creatinine. Have recommended increase fluid intake. Urine shows small leukocytes, few bacteria but also squamous cells. This may be a dirty catch culture is pending. LFTs and lipase are normal. No leukocytosis. CT scan shows nonobstructing left nephrolithiasis. No right ureterolithiasis or obstruction. No other acute abnormality in the abdomen or pelvis. There is cholelithiasis without signs of cholecystitis. She has a negative Murphy  sign, normal white count and LFTs and is nontender in her right upper quadrant prior to pain medication. I do not feel she needs an emergent abdominal ultrasound but have discussed with family that this could be causing some of her pain. Have recommended changing her diet to a low-fat diet. Have offered pain medication but daughter declines. States the patient has tramadol at home. We'll discharge with Zofran to use as needed for nausea. Will give outpatient surgical follow-up information to use as needed but have discussed with them that patient is likely a poor surgical candidate. Patient reports that she feels her pain is better. She's not had any vomiting in the emergency department. Family at bedside given return precautions. They verbalize understanding and are comfortable with this plan.      I personally performed the services described in this documentation, which was scribed in my presence. The recorded information has been reviewed and is accurate.   [place], DO 11/01/15 [phone]

## 2015-10-31 NOTE — ED Notes (Signed)
Pt c/o back pain for the past 3 days with CVA tenderness. Hx of kidney stones and UTI.

## 2015-11-01 ENCOUNTER — Emergency Department (HOSPITAL_COMMUNITY): Payer: Medicare Other

## 2015-11-01 DIAGNOSIS — M549 Dorsalgia, unspecified: Secondary | ICD-10-CM | POA: Diagnosis not present

## 2015-11-01 DIAGNOSIS — R109 Unspecified abdominal pain: Secondary | ICD-10-CM | POA: Diagnosis not present

## 2015-11-01 LAB — HEPATIC FUNCTION PANEL
ALBUMIN: 3.8 g/dL (ref 3.5–5.0)
ALK PHOS: 52 U/L (ref 38–126)
ALT: 22 U/L (ref 14–54)
AST: 22 U/L (ref 15–41)
BILIRUBIN TOTAL: 0.5 mg/dL (ref 0.3–1.2)
Bilirubin, Direct: <0.1 mg/dL — ABNORMAL LOW (ref 0.1–0.5)
TOTAL PROTEIN: 6.8 g/dL (ref 6.5–8.1)

## 2015-11-01 LAB — BASIC METABOLIC PANEL
Anion gap: 9 (ref 5–15)
BUN: 25 mg/dL — AB (ref 6–20)
CO2: 25 mmol/L (ref 22–32)
Calcium: 9.4 mg/dL (ref 8.9–10.3)
Chloride: 105 mmol/L (ref 101–111)
Creatinine, Ser: 1.22 mg/dL — ABNORMAL HIGH (ref 0.44–1.00)
GFR calc Af Amer: 47 mL/min — ABNORMAL LOW (ref >60)
GFR calc non Af Amer: 41 mL/min — ABNORMAL LOW (ref >60)
Glucose, Bld: 113 mg/dL — ABNORMAL HIGH (ref 65–99)
POTASSIUM: 4.1 mmol/L (ref 3.5–5.1)
SODIUM: 139 mmol/L (ref 135–145)

## 2015-11-01 LAB — LIPASE, BLOOD: LIPASE: 32 U/L (ref 11–51)

## 2015-11-01 MED ORDER — ONDANSETRON 4 MG PO TBDP: 4.0000 mg | ORAL_TABLET | Freq: Three times a day (TID) | ORAL | Status: AC | PRN

## 2015-11-01 MED ORDER — ONDANSETRON 4 MG PO TBDP
4.0000 mg | ORAL_TABLET | Freq: Once | ORAL | Status: AC
  Administered 2015-11-01: 4 mg via ORAL
  Filled 2015-11-01: qty 1

## 2015-11-01 MED ORDER — TRAMADOL HCL 50 MG PO TABS
50.0000 mg | ORAL_TABLET | Freq: Once | ORAL | Status: AC
  Administered 2015-11-01: 50 mg via ORAL
  Filled 2015-11-01: qty 1

## 2015-11-01 NOTE — Discharge Instructions (Signed)
Biliary Colic °Biliary colic is a pain in the upper abdomen. The pain: °· Is usually felt on the right side of the abdomen, but it may also be felt in the center of the abdomen, just below the breastbone (sternum). °· May spread back toward the right shoulder blade. °· May be steady or irregular. °· May be accompanied by nausea and vomiting. °Most of the time, the pain goes away in 1-5 hours. After the most intense pain passes, the abdomen may continue to ache mildly for about 24 hours. °Biliary colic is caused by a blockage in the bile duct. The bile duct is a pathway that carries bile--a liquid that helps to digest fats--from the gallbladder to the small intestine. Biliary colic usually occurs after eating, when the digestive system demands bile. The pain develops when muscle cells contract forcefully to try to move the blockage so that bile can get by. °HOME CARE INSTRUCTIONS °· Take medicines only as directed by your health care provider. °· Drink enough fluid to keep your urine clear or pale yellow. °· Avoid fatty, greasy, and fried foods. These kinds of foods increase your body's demand for bile. °· Avoid any foods that make your pain worse. °· Avoid overeating. °· Avoid having a large meal after fasting. °SEEK MEDICAL CARE IF: °· You develop a fever. °· Your pain gets worse. °· You vomit. °· You develop nausea that prevents you from eating and drinking. °SEEK IMMEDIATE MEDICAL CARE IF: °· You suddenly develop a fever and shaking chills. °· You develop a yellowish discoloration (jaundice) of: °· Skin. °· Whites of the eyes. °· Mucous membranes. °· You have continuous or severe pain that is not relieved with medicines. °· You have nausea and vomiting that is not relieved with medicines. °· You develop dizziness or you faint. °  °This information is not intended to replace advice given to you by your health care provider. Make sure you discuss any questions you have with your health care provider. °  °Document  Released: 05/02/2006 Document Revised: 04/15/2015 Document Reviewed: 09/10/2014 °Elsevier Interactive Patient Education ©2016 Elsevier Inc[employer] ° °Cholelithiasis °Cholelithiasis (also called gallstones) is a form of gallbladder disease in which gallstones form in your gallbladder. The gallbladder is an organ that stores bile made in the liver, which helps digest fats. Gallstones begin as small crystals and slowly grow into stones. Gallstone pain occurs when the gallbladder spasms and a gallstone is blocking the duct. Pain can also occur when a stone passes out of the duct.  °RISK FACTORS °· Being female.   °· Having multiple pregnancies. Health care providers sometimes advise removing diseased gallbladders before future pregnancies.   °· Being obese. °· Eating a diet heavy in fried foods and fat.   °· Being older than 60 years and increasing age.   °· Prolonged use of medicines containing female hormones.   °· Having diabetes mellitus.   °· Rapidly losing weight.   °· Having a family history of gallstones (heredity).   °SYMPTOMS °· Nausea.   °· Vomiting. °· Abdominal pain.   °· Yellowing of the skin (jaundice).   °· Sudden pain. It may persist from several minutes to several hours. °· Fever.   °· Tenderness to the touch.  °In some cases, when gallstones do not move into the bile duct, people have no pain or symptoms. These are called "silent" gallstones.  °TREATMENT °Silent gallstones do not need treatment. In severe cases, emergency surgery may be required. Options for treatment include: °· Surgery to remove the gallbladder. This is the most common treatment. °·   Medicines. These do not always work and may take 6-12 months or more to work. °· Shock wave treatment (extracorporeal biliary lithotripsy). In this treatment an ultrasound machine sends shock waves to the gallbladder to break gallstones into smaller pieces that can pass into the intestines or be dissolved by medicine. °HOME CARE INSTRUCTIONS  °· Only take  over-the-counter or prescription medicines for pain, discomfort, or fever as directed by your health care provider.   °· Follow a low-fat diet until seen again by your health care provider. Fat causes the gallbladder to contract, which can result in pain.   °· Follow up with your health care provider as directed. Attacks are almost always recurrent and surgery is usually required for permanent treatment.   °SEEK IMMEDIATE MEDICAL CARE IF:  °· Your pain increases and is not controlled by medicines.   °· You have a fever or persistent symptoms for more than 2-3 days.   °· You have a fever and your symptoms suddenly get worse.   °· You have persistent nausea and vomiting.   °MAKE SURE YOU:  °· Understand these instructions. °· Will watch your condition. °· Will get help right away if you are not doing well or get worse. °  °This information is not intended to replace advice given to you by your health care provider. Make sure you discuss any questions you have with your health care provider. °  °Document Released: 11/25/2005 Document Revised: 08/01/2013 Document Reviewed: 05/23/2013 °Elsevier Interactive Patient Education ©2016 [employer] Inc[employer] °Low-Fat Diet for Pancreatitis or Gallbladder Conditions °A low-fat diet can be helpful if you have pancreatitis or a gallbladder condition. With these conditions, your pancreas and gallbladder have trouble digesting fats. A healthy eating plan with less fat will help rest your pancreas and gallbladder and reduce your symptoms. °WHAT DO I NEED TO KNOW ABOUT THIS DIET? °· Eat a low-fat diet. °¨ Reduce your fat intake to less than 20-30% of your total daily calories. This is less than 50-60 g of fat per day. °¨ Remember that you need some fat in your diet. Ask your dietician what your daily goal should be. °¨ Choose nonfat and low-fat healthy foods. Look for the words "nonfat," "low fat," or "fat free." °¨ As a guide, look on the label and choose foods with less than 3 g of fat per  serving. Eat only one serving. °· Avoid alcohol. °· Do not smoke. If you need help quitting, talk with your health care provider. °· Eat small frequent meals instead of three large heavy meals. °WHAT FOODS CAN I EAT? °Grains °Include healthy grains and starches such as potatoes, wheat bread, fiber-rich cereal, and brown rice. Choose whole grain options whenever possible. In adults, whole grains should account for 45-65% of your daily calories.  °Fruits and Vegetables °Eat plenty of fruits and vegetables. Fresh fruits and vegetables add fiber to your diet. °Meats and Other Protein Sources °Eat lean meat such as chicken and pork. Trim any fat off of meat before cooking it. Eggs, fish, and beans are other sources of protein. In adults, these foods should account for 10-35% of your daily calories. °Dairy °Choose low-fat milk and dairy options. Dairy includes fat and protein, as well as calcium.  °Fats and Oils °Limit high-fat foods such as fried foods, sweets, baked goods, sugary drinks.  °Other °Creamy sauces and condiments, such as mayonnaise, can add extra fat. Think about whether or not you need to use them, or use smaller amounts or low fat options. °WHAT FOODS ARE NOT RECOMMENDED? °·   High fat foods, such as: °¨ Baked goods. °¨ Ice cream. °¨ French toast. °¨ Sweet rolls. °¨ Pizza. °¨ Cheese bread. °¨ Foods covered with batter, butter, creamy sauces, or cheese. °¨ Fried foods. °¨ Sugary drinks and desserts. °· Foods that cause gas or bloating °  °This information is not intended to replace advice given to you by your health care provider. Make sure you discuss any questions you have with your health care provider. °  °Document Released: 12/04/2013 Document Reviewed: 12/04/2013 °Elsevier Interactive Patient Education ©2016 Elsevier Inc. ° °

## 2015-11-03 ENCOUNTER — Telehealth: Payer: Self-pay | Admitting: Internal Medicine

## 2015-11-03 NOTE — Telephone Encounter (Signed)
Pt has been schedule.

## 2015-11-03 NOTE — Telephone Encounter (Signed)
Yes, that is fine. 

## 2015-11-03 NOTE — Telephone Encounter (Signed)
Pt went to [facility]  On 10-31-15 and needs post er follow up asap per daughter however mom does not want to see any one except dr Raliegh Ip, Can I use 2  sda slots on 11-17-15? Dr Raliegh Ip is [facility] of day.

## 2015-11-17 ENCOUNTER — Ambulatory Visit: Payer: Self-pay | Admitting: Internal Medicine

## 2015-11-17 ENCOUNTER — Other Ambulatory Visit: Payer: Self-pay | Admitting: Internal Medicine

## 2016-02-18 ENCOUNTER — Other Ambulatory Visit: Payer: Self-pay | Admitting: Internal Medicine

## 2016-02-18 MED FILL — DONEPEZIL HCL 5 MG TABLET: 5 | 90 days supply | Qty: 90 | Fill #2

## 2016-02-19 MED FILL — LOSARTAN-HCTZ 100-12.5 MG T: 100-12.5 | 90 days supply | Qty: 90 | Fill #0

## 2016-04-25 ENCOUNTER — Other Ambulatory Visit: Payer: Self-pay | Admitting: Oncology

## 2016-04-26 ENCOUNTER — Other Ambulatory Visit: Payer: Self-pay

## 2016-04-26 ENCOUNTER — Ambulatory Visit: Payer: Self-pay | Admitting: Oncology

## 2016-05-17 ENCOUNTER — Other Ambulatory Visit: Payer: Self-pay | Admitting: Internal Medicine

## 2016-05-17 MED FILL — DONEPEZIL HCL 5 MG TABLET: 5 | 90 days supply | Qty: 90 | Fill #3

## 2016-05-19 ENCOUNTER — Other Ambulatory Visit: Payer: Self-pay | Admitting: Internal Medicine

## 2016-05-19 MED FILL — LOSARTAN-HCTZ 100-12.5 MG T: 100-12.5 | 90 days supply | Qty: 90 | Fill #0

## 2016-08-09 ENCOUNTER — Other Ambulatory Visit: Payer: Self-pay | Admitting: Internal Medicine

## 2016-08-18 MED FILL — DONEPEZIL HCL 5 MG TABLET: 5 | 90 days supply | Qty: 90 | Fill #0

## 2016-08-18 MED FILL — LOSARTAN-HCTZ 100-12.5 MG T: 100-12.5 | 90 days supply | Qty: 90 | Fill #0

## 2016-11-24 MED FILL — LOSARTAN-HCTZ 100-12.5 MG T: 100-12.5 | 90 days supply | Qty: 90 | Fill #1

## 2016-11-24 MED FILL — DONEPEZIL HCL 5 MG TABLET: 5 | 90 days supply | Qty: 90 | Fill #1

## 2016-12-13 ENCOUNTER — Other Ambulatory Visit: Payer: Self-pay | Admitting: Nurse Practitioner

## 2017-03-03 MED FILL — LOSARTAN-HCTZ 100-12.5 MG T: 100-12.5 | 90 days supply | Qty: 90 | Fill #2

## 2017-03-03 MED FILL — DONEPEZIL HCL 5 MG TABLET: 5 | 90 days supply | Qty: 90 | Fill #2

## 2017-06-29 MED FILL — DONEPEZIL HCL 5 MG TABLET: 5 | 90 days supply | Qty: 90 | Fill #3

## 2017-06-29 MED FILL — LOSARTAN-HCTZ 100-12.5 MG T: 100-12.5 | 90 days supply | Qty: 90 | Fill #3

## 2017-09-01 ENCOUNTER — Encounter: Payer: Self-pay | Admitting: Internal Medicine

## 2017-10-26 ENCOUNTER — Other Ambulatory Visit: Payer: Self-pay | Admitting: Internal Medicine

## 2017-10-26 MED FILL — LOSARTAN-HCTZ 100-12.5 MG T: 100-12.5 | 90 days supply | Qty: 90 | Fill #0

## 2017-10-26 MED FILL — DONEPEZIL HCL 5 MG TABLET: 5 | 90 days supply | Qty: 90 | Fill #0

## 2017-12-05 ENCOUNTER — Emergency Department (HOSPITAL_COMMUNITY)
Admission: EM | Admit: 2017-12-05 | Discharge: 2017-12-05 | Disposition: A | Discharge status: Home / Self Care | Payer: Medicare Other | Attending: Emergency Medicine | Admitting: Emergency Medicine

## 2017-12-05 ENCOUNTER — Ambulatory Visit (HOSPITAL_COMMUNITY)
Admission: EM | Admit: 2017-12-05 | Discharge: 2017-12-05 | Disposition: A | Discharge status: Home / Self Care | Payer: Medicare Other | Source: Home / Self Care

## 2017-12-05 ENCOUNTER — Emergency Department (HOSPITAL_COMMUNITY): Payer: Medicare Other

## 2017-12-05 ENCOUNTER — Encounter (HOSPITAL_COMMUNITY): Payer: Self-pay | Admitting: Emergency Medicine

## 2017-12-05 ENCOUNTER — Other Ambulatory Visit: Payer: Self-pay

## 2017-12-05 DIAGNOSIS — Y929 Unspecified place or not applicable: Secondary | ICD-10-CM | POA: Diagnosis not present

## 2017-12-05 DIAGNOSIS — Z79899 Other long term (current) drug therapy: Secondary | ICD-10-CM | POA: Diagnosis not present

## 2017-12-05 DIAGNOSIS — S0990XA Unspecified injury of head, initial encounter: Secondary | ICD-10-CM | POA: Diagnosis not present

## 2017-12-05 DIAGNOSIS — S52592A Other fractures of lower end of left radius, initial encounter for closed fracture: Secondary | ICD-10-CM | POA: Diagnosis not present

## 2017-12-05 DIAGNOSIS — Y939 Activity, unspecified: Secondary | ICD-10-CM | POA: Diagnosis not present

## 2017-12-05 DIAGNOSIS — Y998 Other external cause status: Secondary | ICD-10-CM | POA: Diagnosis not present

## 2017-12-05 DIAGNOSIS — S6992XA Unspecified injury of left wrist, hand and finger(s), initial encounter: Secondary | ICD-10-CM | POA: Diagnosis present

## 2017-12-05 DIAGNOSIS — S52502A Unspecified fracture of the lower end of left radius, initial encounter for closed fracture: Secondary | ICD-10-CM

## 2017-12-05 DIAGNOSIS — W010XXA Fall on same level from slipping, tripping and stumbling without subsequent striking against object, initial encounter: Secondary | ICD-10-CM | POA: Diagnosis not present

## 2017-12-05 DIAGNOSIS — E785 Hyperlipidemia, unspecified: Secondary | ICD-10-CM | POA: Insufficient documentation

## 2017-12-05 DIAGNOSIS — I10 Essential (primary) hypertension: Secondary | ICD-10-CM | POA: Diagnosis not present

## 2017-12-05 HISTORY — DX: Unspecified dementia, unspecified severity, without behavioral disturbance, psychotic disturbance, mood disturbance, and anxiety: F03.90

## 2017-12-05 LAB — CBC WITH DIFFERENTIAL/PLATELET
BASOS ABS: 0 10*3/uL (ref 0.0–0.1)
BASOS PCT: 0 %
EOS PCT: 1 %
Eosinophils Absolute: 0.1 10*3/uL (ref 0.0–0.7)
HCT: 39.1 % (ref 36.0–46.0)
Hemoglobin: 13.4 g/dL (ref 12.0–15.0)
LYMPHS PCT: 15 %
Lymphs Abs: 1.4 10*3/uL (ref 0.7–4.0)
MCH: 30.8 pg (ref 26.0–34.0)
MCHC: 34.3 g/dL (ref 30.0–36.0)
MCV: 89.9 fL (ref 78.0–100.0)
MONO ABS: 0.7 10*3/uL (ref 0.1–1.0)
Monocytes Relative: 7 %
Neutro Abs: 7.1 10*3/uL (ref 1.7–7.7)
Neutrophils Relative %: 77 %
PLATELETS: 270 10*3/uL (ref 150–400)
RBC: 4.35 MIL/uL (ref 3.87–5.11)
RDW: 12.6 % (ref 11.5–15.5)
WBC: 9.3 10*3/uL (ref 4.0–10.5)

## 2017-12-05 LAB — I-STAT TROPONIN, ED: TROPONIN I, POC: 0 ng/mL (ref 0.00–0.08)

## 2017-12-05 MED ORDER — TRAMADOL HCL 50 MG PO TABS: 50.0000 mg | ORAL_TABLET | Freq: Four times a day (QID) | ORAL | 0 refills | Status: AC | PRN

## 2017-12-05 MED ORDER — TETANUS-DIPHTH-ACELL PERTUSSIS 5-2.5-18.5 LF-MCG/0.5 IM SUSP: 0.5000 mL | Freq: Once | INTRAMUSCULAR | Status: DC

## 2017-12-05 MED ORDER — TRAMADOL HCL 50 MG PO TABS
50.0000 mg | ORAL_TABLET | Freq: Once | ORAL | Status: AC
  Administered 2017-12-05: 50 mg via ORAL
  Filled 2017-12-05: qty 1

## 2017-12-05 NOTE — ED Provider Notes (Signed)
[facility] EMERGENCY DEPARTMENT Provider Note   CSN: [id number] Arrival date & time: 12/05/17  1036     History   Chief Complaint Chief Complaint  Patient presents with  . Fall  . Wrist Injury  . Head Injury    HPI Misty Alvarado is a 83 y.o. female. Chief complaint is fall, left wrist pain  HPI: Usual female fell in her home this morning. Brought in by family for evaluation. Has left wrist pain and swelling. Has abrasion contusion just lateral to left eyebrow.  Has history of dementia. At baseline per family.  Past Medical History:  Diagnosis Date  . ALLERGIC RHINITIS 10/03/2007  . Arthritis    KNEES  . Dementia   . Endometrial cancer ([place])    new diagnosis 03/02/13  . GERD (gastroesophageal reflux disease)    "ONCE IN A WHILE"  TUMS IF NEEDED  . History of radiation therapy 10/22/13, 10/29/13, 11/05/13, 11/12/13, 11/19/13   proximal vagina 30 gray  . HYPERLIPIDEMIA 10/03/2007  . HYPERTENSION 10/03/2007  . Ulcer    gastric    Patient Active Problem List   Diagnosis Date Noted  . Dementia 08/07/2015  . Rectal pain 09/26/2013  . Unspecified constipation 09/26/2013  . Overflow incontinence 09/26/2013  . 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 Protein-calorie malnutrition, severe ([place]) 07/15/2013  . Atrial fibrillation with RVR ([place]) 07/14/2013  . Urinary tract infection 07/14/2013  . Abdominal pain 07/14/2013  . Hypokalemia 07/14/2013  . Rash and nonspecific skin eruption 07/14/2013  . ARF (acute renal failure) ([place]) 06/22/2013  . Endometrial cancer ([place]) 04/02/2013  . Pure hypercholesterolemia 10/03/2007  . Essential hypertension 10/03/2007  . Allergic rhinitis 10/03/2007    Past Surgical History:  Procedure Laterality Date  . ABDOMINAL HYSTERECTOMY    . BACK SURGERY  1968   Ruptured disc repair  . CARDIAC CATHETERIZATION    . LITHOTRIPSY     for kidney stone  . LYMPH NODE DISSECTION N/A 04/24/2013   Procedure: LYMPH NODE DISSECTION;  Surgeon: Imagene Gurney A. Alycia Rossetti, MD;  Location: [facility];  Service: Gynecology;  Laterality: N/A;  . PARTIAL THYROIDECTOMY FOR A GROWTH     YRS AGO   . ROBOTIC ASSISTED TOTAL HYSTERECTOMY WITH BILATERAL SALPINGO OOPHERECTOMY Bilateral 04/24/2013   Procedure: ROBOTIC ASSISTED TOTAL HYSTERECTOMY WITH BILATERAL SALPINGO OOPHORECTOMY,  LYMPH NODE DISSECTION;  Surgeon: Imagene Gurney A. Alycia Rossetti, MD;  Location: [facility];  Service: Gynecology;  Laterality: Bilateral;  . TUBAL LIGATION      OB History    No data available       Home Medications    Prior to Admission medications   Medication Sig Start Date End Date Taking? Authorizing Provider  acetaminophen (TYLENOL) 650 MG CR tablet Take 1,300 mg by mouth every 8 (eight) hours as needed for pain. arthiritis   Yes [provider]  beta carotene w/minerals (OCUVITE) tablet Take 1 tablet by mouth daily.   Yes [provider]  Cholecalciferol (VITAMIN D-3) 1000 UNITS CAPS Take 1,000 Units by mouth daily.    Yes [provider]  donepezil (ARICEPT) 5 MG tablet TAKE 1 TABLET BY MOUTH AT BEDTIME. Patient taking differently: TAKE 5 mg  TABLET BY MOUTH AT BEDTIME. 10/26/17  Yes Marletta Lor, MD  hydroxypropyl methylcellulose / hypromellose (ISOPTO TEARS / GONIOVISC) 2.5 % ophthalmic solution Place 1 drop into both eyes 3 (three) times daily as needed for dry eyes.   Yes [provider]  losartan-hydrochlorothiazide (HYZAAR) 100-12.5 MG  tablet TAKE 1 TABLET BY MOUTH DAILY. Patient taking differently: TAKE 1 TABLET BY MOUTH in the evening 05/19/16  Yes Marletta Lor, MD  magnesium gluconate (MAGONATE) 500 MG tablet Take 500 mg by mouth daily.   Yes [provider]  Melatonin 5 MG TABS Take 5 mg by mouth at bedtime.   Yes [provider]  pyridOXINE (VITAMIN B-6) 100 MG tablet Take 100 mg by mouth daily.   Yes [provider]  vitamin B-12 (CYANOCOBALAMIN) 1000 MCG  tablet Take 1,000 mcg by mouth daily.   Yes [provider]  losartan-hydrochlorothiazide (HYZAAR) 100-12.5 MG tablet TAKE 1 TABLET BY MOUTH DAILY. Patient not taking: Reported on 12/05/2017 10/26/17   Marletta Lor, MD  ondansetron (ZOFRAN ODT) 4 MG disintegrating tablet Take 1 tablet (4 mg total) by mouth every 8 (eight) hours as needed for nausea or vomiting. Patient not taking: Reported on 12/05/2017 11/01/15   Ward, Delice Bison, DO  traMADol (ULTRAM) 50 MG tablet Take 1 tablet (50 mg total) by mouth every 6 (six) hours as needed. 12/05/17   Tanna Furry, MD    Family History Family History  Problem Relation Age of Onset  . Arthritis Mother   . Cancer Sister        1 deceased from lung cancer  . Cancer Brother        2 deceased from lung cancer  . Colon cancer Neg Hx     Social History Social History   Tobacco Use  . Smoking status: Never Smoker  . Smokeless tobacco: Never Used  Substance Use Topics  . Alcohol use: No  . Drug use: No     Allergies   Dexamethasone; Prednisone; and Rocephin [ceftriaxone sodium in dextrose]   Review of Systems Review of Systems  Constitutional: Negative for appetite change, chills, diaphoresis, fatigue and fever.  HENT: Negative for mouth sores, sore throat and trouble swallowing.   Eyes: Negative for visual disturbance.  Respiratory: Negative for cough, chest tightness, shortness of breath and wheezing.   Cardiovascular: Negative for chest pain.  Gastrointestinal: Negative for abdominal distention, abdominal pain, diarrhea, nausea and vomiting.  Endocrine: Negative for polydipsia, polyphagia and polyuria.  Genitourinary: Negative for dysuria, frequency and hematuria.  Musculoskeletal: Positive for arthralgias. Negative for gait problem.  Skin: Positive for wound. Negative for color change, pallor and rash.  Neurological: Negative for dizziness, syncope, light-headedness and headaches.  Hematological: Does not  bruise/bleed easily.  Psychiatric/Behavioral: Negative for behavioral problems and confusion.     Physical Exam Updated Vital Signs BP 133/77 (BP Location: Right Arm)   Pulse 84   Temp 98.1 F (36.7 C) (Oral)   Resp 17   SpO2 100%   Physical Exam  Constitutional: She appears well-developed and well-nourished. No distress.  HENT:  Head: Normocephalic.  Abrasion contusion adjacent to left eyebrow. No periorbital ecchymosis. Normal globe. Normal symmetric wreck pupils. No blood over the TMs, mastoids, or from the ears nose or mouth.  Eyes: Conjunctivae are normal. Pupils are equal, round, and reactive to light. No scleral icterus.  Neck: Normal range of motion. Neck supple. No thyromegaly present.  Cardiovascular: Normal rate and regular rhythm. Exam reveals no gallop and no friction rub.  No murmur heard. Pulmonary/Chest: Effort normal and breath sounds normal. No respiratory distress. She has no wheezes. She has no rales.  Abdominal: Soft. Bowel sounds are normal. She exhibits no distension. There is no tenderness. There is no rebound.  Musculoskeletal: Normal range of motion.  Soft tissue swelling without anatomical deformity to left distal forearm and wrist. No open wounds.  Neurological: She is alert.  Skin: Skin is warm and dry. No rash noted.  Psychiatric: She has a normal mood and affect. Her behavior is normal.     ED Treatments / Results  Labs (all labs ordered are listed, but only abnormal results are displayed) Labs Reviewed  CBC WITH DIFFERENTIAL/PLATELET  CBC WITH DIFFERENTIAL/PLATELET  URINALYSIS, ROUTINE W REFLEX MICROSCOPIC  I-STAT TROPONIN, ED    EKG  EKG Interpretation None       Radiology Dg Chest 2 View  Result Date: 12/05/2017 CLINICAL DATA:  [place] this morning EXAM: CHEST  2 VIEW COMPARISON:  01/10/2015 FINDINGS: Normal heart size, mediastinal contours, and pulmonary vascularity. Minimal bibasilar atelectasis. No definite acute infiltrate,  pleural effusion or pneumothorax. Bones demineralized without acute osseous abnormalities. IMPRESSION: Minimal bibasilar atelectasis Electronically Signed   By: Lavonia Dana M.D.   On: 12/05/2017 12:38   Dg Forearm Left  Result Date: 12/05/2017 CLINICAL DATA:  [place] sometime this morning, patient confused, does not remember what happened, history dementia EXAM: LEFT FOREARM - 2 VIEW COMPARISON:  LEFT wrist radiographs 12/05/2017 FINDINGS: Osseous demineralization. Elbow joint spaces and alignment preserved. Displaced ulnar styloid fracture. Comminuted displaced intra-articular distal LEFT radial metaphyseal fracture as previously noted. No additional fracture, dislocation, or bone destruction. Significant soft tissue swelling of the distal forearm into wrist. IMPRESSION: Displaced LEFT ulnar styloid fracture. Comminuted displaced and angulated intra-articular fracture of the distal LEFT radial metaphysis. Electronically Signed   By: Lavonia Dana M.D.   On: 12/05/2017 12:37   Dg Wrist Complete Left  Result Date: 12/05/2017 CLINICAL DATA:  [place] sometime this morning EXAM: LEFT WRIST - COMPLETE 3+ VIEW COMPARISON:  None FINDINGS: Diffuse osseous demineralization. Narrowing of radiocarpal joint and STT joint. Displaced fracture tip of ulnar styloid process. Metaphyseal fracture distal LEFT radius with dorsal tilt of distal radial articular surface and minimal displacement. Intra-articular extension of distal radial fracture into radiocarpal joint. No additional fracture, dislocation, or bone destruction. Soft tissue swelling at LEFT wrist and forearm. IMPRESSION: LEFT ulnar styloid fracture. Distal LEFT radial metaphyseal fracture with dorsal tilt of distal radial articular surface and intra-articular extension into radiocarpal joint. Electronically Signed   By: Lavonia Dana M.D.   On: 12/05/2017 12:36   Ct Head Wo Contrast  Result Date: 12/05/2017 CLINICAL DATA:  Patient fell this morning and hit the left  bridge of her eye. Per patient "she is tired and her neck hurts" Patient has dementia; Pt fell this morning in bathroom and hit head Patient fell this morning and hit the left bridge of her eye. Per patient "she is tired and her neck hurts" Patient has dementia EXAM: CT HEAD WITHOUT CONTRAST CT CERVICAL SPINE WITHOUT CONTRAST TECHNIQUE: Multidetector CT imaging of the head and cervical spine was performed following the standard protocol without intravenous contrast. Multiplanar CT image reconstructions of the cervical spine were also generated. COMPARISON:  09-26-202014 FINDINGS: CT HEAD FINDINGS Brain: Diffuse cerebral atrophy. No acute intracranial abnormality. Specifically, no hemorrhage, hydrocephalus, mass lesion, acute infarction, or significant intracranial injury. Vascular: No hyperdense vessel or unexpected calcification. Skull: No acute calvarial abnormality. Sinuses/Orbits: Visualized paranasal sinuses and mastoids clear. Orbital soft tissues unremarkable. 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 No fracture. Soft tissues and spinal canal: Prevertebral soft tissues are normal. No epidural or paraspinal hematoma. Disc levels: Diffuse degenerative disc disease  and facet disease bilaterally. Upper chest: No acute findings. Other: Prior left thyroidectomy. Several small low-density nodules in the right thyroid lobe. IMPRESSION: Diffuse cerebral atrophy.  No acute intracranial abnormality. No acute bony abnormality in the cervical spine. Electronically Signed   By: Rolm Baptise M.D.   On: 12/05/2017 12:11   Ct Cervical Spine Wo Contrast  Result Date: 12/05/2017 CLINICAL DATA:  Patient fell this morning and hit the left bridge of her eye. Per patient "she is tired and her neck hurts" Patient has dementia; Pt fell this morning in bathroom and hit head Patient fell this morning and hit the left bridge of her eye. Per patient "she is  tired and her neck hurts" Patient has dementia EXAM: CT HEAD WITHOUT CONTRAST CT CERVICAL SPINE WITHOUT CONTRAST TECHNIQUE: Multidetector CT imaging of the head and cervical spine was performed following the standard protocol without intravenous contrast. Multiplanar CT image reconstructions of the cervical spine were also generated. COMPARISON:  02/19/2013 FINDINGS: CT HEAD FINDINGS Brain: Diffuse cerebral atrophy. No acute intracranial abnormality. Specifically, no hemorrhage, hydrocephalus, mass lesion, acute infarction, or significant intracranial injury. Vascular: No hyperdense vessel or unexpected calcification. Skull: No acute calvarial abnormality. Sinuses/Orbits: Visualized paranasal sinuses and mastoids clear. Orbital soft tissues unremarkable. 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 No fracture. Soft tissues and spinal canal: Prevertebral soft tissues are normal. No epidural or paraspinal hematoma. Disc levels: Diffuse degenerative disc disease and facet disease bilaterally. Upper chest: No acute findings. Other: Prior left thyroidectomy. Several small low-density nodules in the right thyroid lobe. IMPRESSION: Diffuse cerebral atrophy.  No acute intracranial abnormality. No acute bony abnormality in the cervical spine. Electronically Signed   By: Rolm Baptise M.D.   On: 12/05/2017 12:11    Procedures Procedures (including critical care time)  Medications Ordered in ED Medications  Tdap (BOOSTRIX) injection 0.5 mL (not administered)  traMADol (ULTRAM) tablet 50 mg (50 mg Oral Given 12/05/17 1331)     Initial Impression / Assessment and Plan / ED Course  I have reviewed the triage vital signs and the nursing notes.  Pertinent labs & imaging results that were available during my care of the patient were reviewed by me and considered in my medical decision making (see chart for details).   x-ray show nondisplaced  his radius and ulnar fracture. Closed injury. CT head, C-spine negative. Chest x-ray negative.  Final Clinical Impressions(s) / ED Diagnoses   Final diagnoses:  Closed fracture of distal end of left radius, unspecified fracture morphology, initial encounter    Patient placed in a sugar tong splint by orthopedic tech. Placed in sling. Given tramadol here. Pain is improving. Plan discharge home. She is taken, and tolerated tramadol the past. Has surgical follow-up. We'll call Dr. Burney Gauze for outpatient appointment.  ED Discharge Orders        Ordered    traMADol (ULTRAM) 50 MG tablet  Every 6 hours PRN     12/05/17 1852       Tanna Furry, MD 12/05/17 [phone]

## 2017-12-05 NOTE — Discharge Instructions (Signed)
Sling and splint until seen by hand surgeon. Tramadol for pain Call Dr. Burney Gauze for follow up appointment.

## 2017-12-05 NOTE — ED Notes (Signed)
Patient denies pain and is resting comfortably.  

## 2017-12-05 NOTE — ED Notes (Signed)
Called pt for vitals re-check, no answer.  

## 2017-12-05 NOTE — ED Provider Notes (Signed)
MSE was initiated and I personally evaluated the patient and placed orders (if any) at  11:06 AM on December 05, 2017.  Patient presents today for evaluation after a fall.  Her daughter accompanies her who is a Marine scientist.  According to daughter patient has a baseline history of dementia.  She went to see her mother this morning and found her mother sitting on the commode with a laceration over her left eyebrow.  She guesses that her mother had fallen approximately an hour before, gotten up and walked across the bathroom.  She has obvious deformity of her left wrist with associated swelling and pain.  She is in no obvious distress unless her wrist is touched.  She denies any pain in her head or neck.  Daughter reports that she has not been sick recently.  Daughter reports that she appears to be at her normal mental baseline.  Daughter reports that patient has a prescription at home for tramadol which she takes occasionally for "gallbladder problems" and has not had any for the past few weeks.  She does report that she tolerates the tramadol very well when she takes it, and does not get loopy or confused.  Daughter is an Therapist, sports, and informed her to keep patient n.p.o. other than tramadol.  Unsure when last tetanus shot was, will update here today.  Orders placed for basic labs due to unknown cause of fall/questionable syncope, CT head, C-spine, and plain films of left wrist and forearm.  The patient appears stable so that the remainder of the MSE may be completed by another provider.   Lorin Glass, Hershal Coria 12/05/17 [place], MD 12/05/17 [phone]

## 2017-12-05 NOTE — ED Notes (Signed)
Per Ivar Drape, PA.... If pt hit her head pt is to go to [facility] ED for further eval  Spoke w/pt and daughter who is an Therapist, sports sts pt fell this morning in the shower and hit her head.... Has a knot of the left side eye brown  Daughter sts she does not to concerned about the head inj but more concerned about the wrist inj.... Sts pt was A&O x4 this am after fall.

## 2017-12-05 NOTE — ED Triage Notes (Signed)
[facility] sometime this am was in [place]  Has injury to head 1/4 inch tear to left eye brow, bleeding controlled and left wrist with deformity , daughter who is nurse was not sure what made her fall , pt aa  Has some dementia

## 2017-12-05 NOTE — ED Notes (Addendum)
Misty Y, PA went and spoke w/pt's daughter before she finished registration.... Recommended for her to go to [facility] ED.... Pt's daughter was hesitant... Sts pt is alert but does have hx of dementia .... Again she confirmed that she's not worried about the head inj ... 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 Misty told her that pt's over 83 Alvarado/o w/head inj are to go to ED for poss CT.

## 2017-12-05 NOTE — ED Triage Notes (Signed)
Ortho tec paged

## 2017-12-05 NOTE — Progress Notes (Signed)
Orthopedic Tech Progress Note Patient Details:  Misty Alvarado 08-Feb-1933 [id number]  Ortho Devices Type of Ortho Device: Arm sling, Sugartong splint Ortho Device/Splint Interventions: Application   Post Interventions Patient Tolerated: Well Instructions Provided: Care of device, Adjustment of device   [name] 12/05/2017, 6:53 PM

## 2018-01-10 ENCOUNTER — Telehealth: Payer: Self-pay | Admitting: Internal Medicine

## 2018-01-10 NOTE — Telephone Encounter (Signed)
Left message for Misty Alvarado to return call to the office to discuss symptoms regarding a prescription for Tamiflu.

## 2018-01-10 NOTE — Telephone Encounter (Signed)
Copied from [facility] [id number]. Topic: Quick Communication - Rx Refill/Question >> Jan 10, 2018  5:09 PM Patrice Paradise wrote: Medication: Tamuflu   Has the patient contacted their pharmacy? Yes.     (Agent: If no, request that the patient contact the pharmacy for the refill.)   Preferred Pharmacy (with phone number or street name):  [facility], [place]. [address] [place] [address] Phone: [phone] Fax: [phone]  (IF NOT TONIGHT, DAUGHTER WOULD LIKE THE MED TO [place])        Agent: Please be advised that RX refills may take up to 3 business days. We ask that you follow-up with your pharmacy.

## 2018-01-11 MED ORDER — OSELTAMIVIR PHOSPHATE 75 MG PO CAPS: 75.0000 mg | ORAL_CAPSULE | Freq: Every day | ORAL | 0 refills | Status: AC

## 2018-01-11 MED FILL — OSELTAMIVIR PHOSPHATE 75 MG: 75 | 5 days supply | Qty: 5 | Fill #0

## 2018-01-11 NOTE — Telephone Encounter (Signed)
Can you ask Dr. Carita Pian to review one more time please?

## 2018-01-11 NOTE — Telephone Encounter (Signed)
I sent script e-scribe to [place] [facility] and spoke with daughter.

## 2018-01-11 NOTE — Telephone Encounter (Signed)
I spoke with daughter Bonnita Nasuti, she is one that was diagnosed with the flu yesterday and does live with mom. She noticed that pt is starting to cough, gave here muccinex and cough syrup. Daughter is concerned that she will get the flu.

## 2018-01-11 NOTE — Telephone Encounter (Signed)
Per Dr. Volanda Napoleon patient can take tamiflu 75mg  once daily for 5 days.

## 2018-01-11 NOTE — Addendum Note (Signed)
Addended by: Aggie Hacker A on: 01/11/2018 05:07 PM   Modules accepted: Orders

## 2018-01-11 NOTE — Telephone Encounter (Signed)
Dr Sherren Mocha please see below messages, please advise.

## 2018-01-11 NOTE — Telephone Encounter (Signed)
Butch Penny (daughter) said she missed a call from a nurse, not sure which one. Please call back [phone]

## 2018-01-11 NOTE — Telephone Encounter (Signed)
Pt.'s daughter, Bonnita Nasuti, called back. States she was diagnosed with flu yesterday and pt. Has started with a dry cough and she is worried she will get the flu as well. States it is very difficult getting pt. Out of the house for an office visit. Please advise and call daughter back at [phone].

## 2018-01-11 NOTE — Telephone Encounter (Signed)
Dr K pt 

## 2018-02-16 MED FILL — DONEPEZIL HCL 5 MG TABLET: 5 | 90 days supply | Qty: 90 | Fill #1

## 2018-02-16 MED FILL — LOSARTAN-HCTZ 100-12.5 MG T: 100-12.5 | 90 days supply | Qty: 90 | Fill #1

## 2019-03-21 ENCOUNTER — Emergency Department (HOSPITAL_COMMUNITY)
Admission: EM | Admit: 2019-03-21 | Discharge: 2019-03-22 | Disposition: A | Discharge status: Home / Self Care | Payer: Medicare Other | Attending: Emergency Medicine | Admitting: Emergency Medicine

## 2019-03-21 ENCOUNTER — Other Ambulatory Visit: Payer: Self-pay

## 2019-03-21 ENCOUNTER — Encounter (HOSPITAL_COMMUNITY): Payer: Self-pay | Admitting: Emergency Medicine

## 2019-03-21 ENCOUNTER — Emergency Department (HOSPITAL_COMMUNITY): Payer: Medicare Other

## 2019-03-21 DIAGNOSIS — Z79899 Other long term (current) drug therapy: Secondary | ICD-10-CM | POA: Diagnosis not present

## 2019-03-21 DIAGNOSIS — F0390 Unspecified dementia without behavioral disturbance: Secondary | ICD-10-CM | POA: Insufficient documentation

## 2019-03-21 DIAGNOSIS — I10 Essential (primary) hypertension: Secondary | ICD-10-CM | POA: Diagnosis not present

## 2019-03-21 DIAGNOSIS — R55 Syncope and collapse: Secondary | ICD-10-CM | POA: Diagnosis present

## 2019-03-21 DIAGNOSIS — R4189 Other symptoms and signs involving cognitive functions and awareness: Secondary | ICD-10-CM | POA: Insufficient documentation

## 2019-03-21 DIAGNOSIS — N3000 Acute cystitis without hematuria: Secondary | ICD-10-CM | POA: Insufficient documentation

## 2019-03-21 LAB — COMPREHENSIVE METABOLIC PANEL
ALT: 13 U/L (ref 0–44)
AST: 22 U/L (ref 15–41)
Albumin: 3.3 g/dL — ABNORMAL LOW (ref 3.5–5.0)
Alkaline Phosphatase: 119 U/L (ref 38–126)
Anion gap: 10 (ref 5–15)
BUN: 12 mg/dL (ref 8–23)
CO2: 22 mmol/L (ref 22–32)
Calcium: 9.1 mg/dL (ref 8.9–10.3)
Chloride: 106 mmol/L (ref 98–111)
Creatinine, Ser: 1.05 mg/dL — ABNORMAL HIGH (ref 0.44–1.00)
GFR calc Af Amer: 57 mL/min — ABNORMAL LOW (ref >60)
GFR calc non Af Amer: 49 mL/min — ABNORMAL LOW (ref >60)
Glucose, Bld: 149 mg/dL — ABNORMAL HIGH (ref 70–99)
Potassium: 3.6 mmol/L (ref 3.5–5.1)
Sodium: 138 mmol/L (ref 135–145)
Total Bilirubin: 0.7 mg/dL (ref 0.3–1.2)
Total Protein: 6.2 g/dL — ABNORMAL LOW (ref 6.5–8.1)

## 2019-03-21 LAB — CBC WITH DIFFERENTIAL/PLATELET
Abs Immature Granulocytes: 0.05 10*3/uL (ref 0.00–0.07)
Basophils Absolute: 0 10*3/uL (ref 0.0–0.1)
Basophils Relative: 1 %
Eosinophils Absolute: 0.1 10*3/uL (ref 0.0–0.5)
Eosinophils Relative: 1 %
HCT: 41.3 % (ref 36.0–46.0)
Hemoglobin: 13.7 g/dL (ref 12.0–15.0)
Immature Granulocytes: 1 %
Lymphocytes Relative: 10 %
Lymphs Abs: 0.8 10*3/uL (ref 0.7–4.0)
MCH: 29.1 pg (ref 26.0–34.0)
MCHC: 33.2 g/dL (ref 30.0–36.0)
MCV: 87.7 fL (ref 80.0–100.0)
Monocytes Absolute: 0.6 10*3/uL (ref 0.1–1.0)
Monocytes Relative: 7 %
Neutro Abs: 6.7 10*3/uL (ref 1.7–7.7)
Neutrophils Relative %: 80 %
Platelets: 286 10*3/uL (ref 150–400)
RBC: 4.71 MIL/uL (ref 3.87–5.11)
RDW: 12.1 % (ref 11.5–15.5)
WBC: 8.3 10*3/uL (ref 4.0–10.5)
nRBC: 0 % (ref 0.0–0.2)

## 2019-03-21 LAB — URINALYSIS, ROUTINE W REFLEX MICROSCOPIC
Bilirubin Urine: NEGATIVE
Glucose, UA: NEGATIVE mg/dL
Ketones, ur: NEGATIVE mg/dL
Nitrite: NEGATIVE
Protein, ur: 100 mg/dL — AB
Specific Gravity, Urine: 1.018 (ref 1.005–1.030)
WBC, UA: >50 WBC/hpf — ABNORMAL HIGH (ref 0–5)
pH: 6 (ref 5.0–8.0)

## 2019-03-21 MED ORDER — SULFAMETHOXAZOLE-TRIMETHOPRIM 800-160 MG PO TABS
1.0000 | ORAL_TABLET | Freq: Once | ORAL | Status: AC
  Administered 2019-03-21: 23:00:00 1 via ORAL
  Filled 2019-03-21: qty 1

## 2019-03-21 MED ORDER — SULFAMETHOXAZOLE-TRIMETHOPRIM 800-160 MG PO TABS: 1.0000 | ORAL_TABLET | Freq: Two times a day (BID) | ORAL | 0 refills | Status: AC

## 2019-03-21 NOTE — ED Notes (Signed)
Pt has a slightly swollen wrist on the right side. Provider notified.

## 2019-03-21 NOTE — ED Notes (Signed)
Patient transported to CT 

## 2019-03-21 NOTE — ED Triage Notes (Signed)
Per EMS, cationic episode, history of dementia.

## 2019-03-21 NOTE — ED Notes (Signed)
Patient is resting comfortably. 

## 2019-03-21 NOTE — ED Triage Notes (Signed)
Pt BIB [facility], family reports pt had a syncopal episode and episode of "forward gaze", no reports of muscle twitching. EMS arrival, pt cool, pale, diaphoretic. Hx dementia, back to baseline at this time. EMS VS BP 140/80, HR 96, SpO2 96% room air, CBG 160.

## 2019-03-21 NOTE — ED Provider Notes (Signed)
[facility] EMERGENCY DEPARTMENT Provider Note   CSN: [id number] Arrival date & time: 03/21/19  1945    History   Chief Complaint Chief Complaint  Patient presents with  . Loss of Consciousness    HPI Misty Alvarado is a 85 y.o. female presenting for evaluation of alteration of consciousness.  Level 5 caveat, patient with dementia, unable to provide any history.  History provided by daughter via the telephone.  Per daughter, patient had a 20 to 30-minute episode where she did not respond verbally and was just staring with her mouth open.  Daughter denies history of similar.  Prior to this event, daughter states patient was acting normally.  Patient with end stages of dementia, over the past several weeks has had increased difficulty walking and eating.  Patient is no longer on any medication.  Daughter states 2 weeks ago, patient fell and hurt her right wrist.  They did not have this evaluated, as previous recs fractures recommended not to be repaired by orthopedics.  Daughter denies recent fevers, chills, cough, nausea, vomiting, or abnormal bowel movements.  Denies complaints of pain.     HPI  Past Medical History:  Diagnosis Date  . ALLERGIC RHINITIS 10/03/2007  . Arthritis    KNEES  . Dementia ([place])   . Endometrial cancer ([place])    new diagnosis 03/02/13  . GERD (gastroesophageal reflux disease)    "ONCE IN A WHILE"  TUMS IF NEEDED  . History of radiation therapy 10/22/13, 10/29/13, 11/05/13, 11/12/13, 11/19/13   proximal vagina 30 gray  . HYPERLIPIDEMIA 10/03/2007  . HYPERTENSION 10/03/2007  . Ulcer    gastric    Patient Active Problem List   Diagnosis Date Noted  . Dementia ([place]) 08/07/2015  . Rectal pain 09/26/2013  . Unspecified constipation 09/26/2013  . Overflow incontinence 09/26/2013  . 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 Protein-calorie malnutrition, severe ([place]) 07/15/2013  . Atrial fibrillation with RVR  ([place]) 07/14/2013  . Urinary tract infection 07/14/2013  . Abdominal pain 07/14/2013  . Hypokalemia 07/14/2013  . Rash and nonspecific skin eruption 07/14/2013  . ARF (acute renal failure) ([place]) 06/22/2013  . Endometrial cancer ([place]) 04/02/2013  . Pure hypercholesterolemia 10/03/2007  . Essential hypertension 10/03/2007  . Allergic rhinitis 10/03/2007    Past Surgical History:  Procedure Laterality Date  . ABDOMINAL HYSTERECTOMY    . BACK SURGERY  1968   Ruptured disc repair  . CARDIAC CATHETERIZATION    . LITHOTRIPSY     for kidney stone  . LYMPH NODE DISSECTION N/A 04/24/2013   Procedure: LYMPH NODE DISSECTION;  Surgeon: Imagene Gurney A. Alycia Rossetti, MD;  Location: [facility] ORS;  Service: Gynecology;  Laterality: N/A;  . PARTIAL THYROIDECTOMY FOR A GROWTH     YRS AGO   . ROBOTIC ASSISTED TOTAL HYSTERECTOMY WITH BILATERAL SALPINGO OOPHERECTOMY Bilateral 04/24/2013   Procedure: ROBOTIC ASSISTED TOTAL HYSTERECTOMY WITH BILATERAL SALPINGO OOPHORECTOMY,  LYMPH NODE DISSECTION;  Surgeon: Imagene Gurney A. Alycia Rossetti, MD;  Location: [facility] ORS;  Service: Gynecology;  Laterality: Bilateral;  . TUBAL LIGATION       OB History   No obstetric history on file.      Home Medications    Prior to Admission medications   Medication Sig Start Date End Date Taking? Authorizing Provider  acetaminophen (TYLENOL) 650 MG CR tablet Take 1,300 mg by mouth every 8 (eight) hours as needed for pain. arthiritis    [provider]  beta carotene w/minerals (OCUVITE) tablet Take 1 tablet  by mouth daily.    [provider]  Cholecalciferol (VITAMIN D-3) 1000 UNITS CAPS Take 1,000 Units by mouth daily.     [provider]  donepezil (ARICEPT) 5 MG tablet TAKE 1 TABLET BY MOUTH AT BEDTIME. Patient taking differently: TAKE 5 mg  TABLET BY MOUTH AT BEDTIME. 10/26/17   Marletta Lor, MD  hydroxypropyl methylcellulose / hypromellose (ISOPTO TEARS / GONIOVISC) 2.5 % ophthalmic solution Place 1 drop into both eyes  3 (three) times daily as needed for dry eyes.    [provider]  losartan-hydrochlorothiazide (HYZAAR) 100-12.5 MG tablet TAKE 1 TABLET BY MOUTH DAILY. Patient taking differently: TAKE 1 TABLET BY MOUTH in the evening 05/19/16   Marletta Lor, MD  losartan-hydrochlorothiazide (HYZAAR) 100-12.5 MG tablet TAKE 1 TABLET BY MOUTH DAILY. Patient not taking: Reported on 12/05/2017 10/26/17   Marletta Lor, MD  magnesium gluconate (MAGONATE) 500 MG tablet Take 500 mg by mouth daily.    [provider]  Melatonin 5 MG TABS Take 5 mg by mouth at bedtime.    [provider]  ondansetron (ZOFRAN ODT) 4 MG disintegrating tablet Take 1 tablet (4 mg total) by mouth every 8 (eight) hours as needed for nausea or vomiting. Patient not taking: Reported on 12/05/2017 11/01/15   Ward, Delice Bison, DO  oseltamivir (TAMIFLU) 75 MG capsule Take 1 capsule (75 mg total) by mouth daily. 01/11/18   Billie Ruddy, MD  pyridOXINE (VITAMIN B-6) 100 MG tablet Take 100 mg by mouth daily.    [provider]  sulfamethoxazole-trimethoprim (BACTRIM DS,SEPTRA DS) 800-160 MG tablet Take 1 tablet by mouth 2 (two) times daily for 5 days. 03/21/19 03/26/19  Makenlee Mckeag, PA-C  traMADol (ULTRAM) 50 MG tablet Take 1 tablet (50 mg total) by mouth every 6 (six) hours as needed. 12/05/17   Tanna Furry, MD  vitamin B-12 (CYANOCOBALAMIN) 1000 MCG tablet Take 1,000 mcg by mouth daily.    [provider]    Family History Family History  Problem Relation Age of Onset  . Arthritis Mother   . Cancer Sister        1 deceased from lung cancer  . Cancer Brother        2 deceased from lung cancer  . Colon cancer Neg Hx     Social History Social History   Tobacco Use  . Smoking status: Never Smoker  . Smokeless tobacco: Never Used  Substance Use Topics  . Alcohol use: No  . Drug use: No     Allergies   Dexamethasone; Penicillins; Prednisone; and Rocephin [ceftriaxone  sodium in dextrose]   Review of Systems Review of Systems  Unable to perform ROS: Dementia  Neurological: Positive for speech difficulty (1 episode, resolved).  Psychiatric/Behavioral: Positive for confusion.     Physical Exam Updated Vital Signs BP (!) 164/110 (BP Location: Left Arm)   Pulse (!) 103   Temp 99.1 F (37.3 C) (Oral)   Resp 18   SpO2 95%   Physical Exam Vitals signs and nursing note reviewed.  Constitutional:      General: She is not in acute distress.    Appearance: She is well-developed.     Comments: Elderly female resting comfortably in the bed in NAD  HENT:     Head: Normocephalic and atraumatic.  Eyes:     Extraocular Movements: Extraocular movements intact.     Conjunctiva/sclera: Conjunctivae normal.     Pupils: Pupils are equal, round, and reactive to  light.  Neck:     Musculoskeletal: Normal range of motion and neck supple.  Cardiovascular:     Rate and Rhythm: Normal rate and regular rhythm.     Pulses: Normal pulses.  Pulmonary:     Effort: Pulmonary effort is normal. No respiratory distress.     Breath sounds: Normal breath sounds. No wheezing.  Abdominal:     General: There is no distension.     Palpations: Abdomen is soft. There is no mass.     Tenderness: There is no abdominal tenderness. There is no guarding or rebound.  Musculoskeletal: Normal range of motion.     Comments: TTP of swelling or R wrist. Radial pulses intact. Good distal cap refill  Skin:    General: Skin is warm and dry.  Neurological:     Sensory: Sensation is intact.     Motor: Motor function is intact.     Comments: Patient alert to person only.  Baseline per family. Strength intact x4. Sensation intact x4.       ED Treatments / Results  Labs (all labs ordered are listed, but only abnormal results are displayed) Labs Reviewed  COMPREHENSIVE METABOLIC PANEL - Abnormal; Notable for the following components:      Result Value   Glucose, Bld 149 (*)     Creatinine, Ser 1.05 (*)    Total Protein 6.2 (*)    Albumin 3.3 (*)    GFR calc non Af Amer 49 (*)    GFR calc Af Amer 57 (*)    All other components within normal limits  URINALYSIS, ROUTINE W REFLEX MICROSCOPIC - Abnormal; Notable for the following components:   Color, Urine AMBER (*)    APPearance CLOUDY (*)    Hgb urine dipstick SMALL (*)    Protein, ur 100 (*)    Leukocytes,Ua TRACE (*)    WBC, UA >50 (*)    Bacteria, UA FEW (*)    All other components within normal limits  URINE CULTURE  CBC WITH DIFFERENTIAL/PLATELET    EKG EKG Interpretation  Date/Time:  Wednesday March 21 2019 19:52:02 EDT Ventricular Rate:  101 PR Interval:    QRS Duration: 97 QT Interval:  353 QTC Calculation: 458 R Axis:   27 Text Interpretation:  Sinus tachycardia No significant change since last tracing Confirmed by Gareth Morgan [phone]) on 03/21/2019 10:58:25 PM   Radiology Dg Chest 2 View  Result Date: 03/21/2019 CLINICAL DATA:  Initial evaluation for acute cough, swelling. EXAM: CHEST - 2 VIEW COMPARISON:  Prior radiograph from 12/05/2017. FINDINGS: Patient is rotated to the right. Mild cardiomegaly. Mediastinal silhouette within normal limits allowing for patient rotation. Lungs hypoinflated. Associated mild bibasilar subsegmental atelectasis. No consolidative opacity. Mild pulmonary interstitial congestion without overt pulmonary edema. No pleural effusion. No pneumothorax. No acute osseous finding.  Osteopenia noted. IMPRESSION: 1. Shallow lung inflation with associated mild bibasilar atelectasis. No consolidative opacity to suggest pneumonia. 2. Underlying mild diffuse pulmonary interstitial congestion without overt pulmonary edema. Electronically Signed   By: Jeannine Boga M.D.   On: 03/21/2019 21:18   Dg Wrist Complete Right  Result Date: 03/21/2019 CLINICAL DATA:  Initial evaluation for acute trauma, fall. EXAM: RIGHT WRIST - COMPLETE 3+ VIEW COMPARISON:  None. FINDINGS: Acute  comminuted oblique fracture through the distal right radius with slight impaction. Associated intra-articular extension. Distal ulna intact. Scaphoid intact. Osteoarthritic changes present about the visualized hand and wrist. Diffuse soft tissue swelling noted about the wrist. IMPRESSION: Acute comminuted fracture of  the distal right radius with slight impaction and intra-articular extension. Electronically Signed   By: Jeannine Boga M.D.   On: 03/21/2019 21:19   Ct Head Wo Contrast  Result Date: 03/21/2019 CLINICAL DATA:  Altered level of consciousness. Syncope. History of dementia. EXAM: CT HEAD WITHOUT CONTRAST TECHNIQUE: Contiguous axial images were obtained from the base of the skull through the vertex without intravenous contrast. COMPARISON:  12/05/2017 FINDINGS: Brain: There is mild-to-moderate motion artifact predominantly above the level of the lateral ventricles, and the inferior aspect of the cerebellum was excluded. Within these limitations, no acute large territory infarct, intracranial hemorrhage, mass, midline shift, or extra-axial fluid collection is identified. Moderate cerebral atrophy is unchanged and most notable in the frontal and temporal lobes. Cerebral white matter hypodensities are similar to the prior study and nonspecific but compatible with mild chronic small vessel ischemic disease. Vascular: Calcified atherosclerosis at the skull base. No hyperdense vessel. Skull: No fracture or suspicious osseous lesion. Sinuses/Orbits: Visualized paranasal sinuses and mastoid air cells are clear. Right cataract extraction is noted. Other: None. IMPRESSION: Motion degraded examination without acute intracranial abnormality identified. Electronically Signed   By: Logan Bores M.D.   On: 03/21/2019 20:56    Procedures Procedures (including critical care time)  Medications Ordered in ED Medications  sulfamethoxazole-trimethoprim (BACTRIM DS,SEPTRA DS) 800-160 MG per tablet 1 tablet (1  tablet Oral Given 03/21/19 2322)     Initial Impression / Assessment and Plan / ED Course  I have reviewed the triage vital signs and the nursing notes.  Pertinent labs & imaging results that were available during my care of the patient were reviewed by me and considered in my medical decision making (see chart for details).        Pt presenting for evaluation of episode of decreased responsiveness. physical exam reassuring, pt back at baseline. Consider infection, progression of dementia, tia, atypical sz. will order labs, ct head, ua, and reassess.   Labs reassuring, no leukocytosis.  Creatinine mildly elevated 1.05.  Urine shows leuks, greater than 50 white cells, and bacteria.  Will send for culture.  Considering change in alteration, will treat for UTI, first dose of Bactrim given in the ED.  Chest x-ray viewed interpreted by me, no pneumonia, pneumothorax, effusion, cardiomegaly.  Right x-ray shows fracture, will order rib splint. CT head negative. EKG unchanged from previous.  Sxs not consistent with typical sz or TIA. Daughter states pt would not want invasive procedures or treatment. As pt has remained baseline, will not persue further neuro workup in the setting where pt will not want/reciefve tx.  Discussed findings with daughter.  Daughter states pt has wrist splint at home, will let pt use that.  Encouraged hydration, and patient given antibiotics for home.  Encouraged follow-up with PCP as needed for further evaluation.  Discussed possibility that this is worsening dementia versus increased dementia symptoms due to infection.  At this time, patient received a discharge.  Return precautions given.  Daughter states she understands and agrees to plan.  Final Clinical Impressions(s) / ED Diagnoses   Final diagnoses:  Acute cystitis without hematuria  Alteration in cognition    ED Discharge Orders         Ordered    sulfamethoxazole-trimethoprim (BACTRIM DS,SEPTRA DS) 800-160 MG  tablet  2 times daily     03/21/19 2305           Kalie Cabral, PA-C 03/22/19 0004    Gareth Morgan, MD 03/24/19 2108

## 2019-03-21 NOTE — Discharge Instructions (Addendum)
Give antibiotics as prescribed. Give the entire course. Make sure she stays well hydrated.  Continue using the wrist splint as needed for comfort. Call her primary care doctor as needed for further evaluation.  Return to the ER if she develops fevers, difficulty breathing, inability to urinate, or any new, worsening, or concerning symptoms.

## 2019-03-22 LAB — URINE CULTURE: Culture: >=100000 — AB

## 2019-03-22 MED FILL — SULFAMETHOXAZOLE-TMP DS TAB: 800-160 | 5 days supply | Qty: 10 | Fill #0

## 2019-03-22 NOTE — ED Notes (Signed)
All appropriate discharge materials given to pt and care giver. PA answered all question.

## 2019-03-23 ENCOUNTER — Telehealth: Payer: Self-pay

## 2019-03-23 NOTE — Telephone Encounter (Signed)
Post ED Visit - Positive Culture Follow-up  Culture report reviewed by antimicrobial stewardship pharmacist: [place] Team []  Elenor Quinones, Pharm.D. []  Heide Guile, Pharm.D., BCPS AQ-ID []  Parks Neptune, Pharm.D., BCPS []  Alycia Rossetti, Pharm.D., BCPS []  [place], Pharm.D., BCPS, AAHIVP []  Legrand Como, Pharm.D., BCPS, AAHIVP []  Salome Arnt, PharmD, BCPS []  Johnnette Gourd, PharmD, BCPS []  Hughes Better, PharmD, BCPS []  Leeroy Cha, PharmD []  Laqueta Linden, PharmD, BCPS []  Albertina Parr, PharmD [facility] [place] [facility] []  Leodis Sias, PharmD []  Lindell Spar, PharmD []  Royetta Asal, PharmD []  Graylin Shiver, Rph []  Rema Fendt) Glennon Mac, PharmD []  Arlyn Dunning, PharmD []  Netta Cedars, PharmD []  Dia Sitter, PharmD []  Leone Haven, PharmD []  Gretta Arab, PharmD []  Theodis Shove, PharmD []  Peggyann Juba, PharmD []  Reuel Boom, PharmD   Positive urine culture Treated with Bactrim DS, organism sensitive to the same and no further patient follow-up is required at this time.  Genia Del 03/23/2019, 8:51 AM

## 2019-03-23 NOTE — Progress Notes (Signed)
ED Antimicrobial Stewardship Positive Culture Follow Up   Misty Alvarado is an 85 y.o. female who presented to [facility] on 03/21/2019 with a chief complaint of alteration of consciousness. Pt has hx of dementia with difficulty walking and eating over the past weeks. WBC WNL, afebrile, UA had few bacteria, >50 WBC, small hgb and trace leukocytes. Pt did not complain of any urinary symptoms.  Chief Complaint  Patient presents with  . Loss of Consciousness    Recent Results (from the past 720 hour(s))  Urine culture     Status: Abnormal   Collection Time: 03/21/19  9:14 PM  Result Value Ref Range Status   Specimen Description URINE, CLEAN CATCH  Final   Special Requests   Final    NONE Performed at [facility], [address], [place], [place] [address]    Culture >=100,000 COLONIES/mL VIRIDANS STREPTOCOCCUS (A)  Final   Report Status 03/22/2019 FINAL  Final    Pt was discharged with bactrim. UCX grew viridans strep. No treatment needed as pt was asymptomatic.  ED Provider: Darlin Drop, PA   [facility] 03/23/2019, 8:27 AM Clinical Pharmacist Monday - Friday phone -  [phone] Saturday - Sunday phone - [phone]

## 2019-07-14 DEATH — deceased
